# Patient Record
Sex: Female | Born: 1945 | Race: White | Hispanic: No | Smoking: Never smoker
Health system: Southern US, Community
[De-identification: ages and names within clinical notes are randomized; demographics above are authoritative.]

## PROBLEM LIST (undated history)

## (undated) DIAGNOSIS — D649 Anemia, unspecified: Secondary | ICD-10-CM

## (undated) DIAGNOSIS — M1990 Unspecified osteoarthritis, unspecified site: Secondary | ICD-10-CM

## (undated) DIAGNOSIS — D171 Benign lipomatous neoplasm of skin and subcutaneous tissue of trunk: Secondary | ICD-10-CM

## (undated) DIAGNOSIS — E119 Type 2 diabetes mellitus without complications: Secondary | ICD-10-CM

## (undated) DIAGNOSIS — M109 Gout, unspecified: Secondary | ICD-10-CM

## (undated) DIAGNOSIS — R634 Abnormal weight loss: Secondary | ICD-10-CM

## (undated) DIAGNOSIS — J189 Pneumonia, unspecified organism: Secondary | ICD-10-CM

## (undated) DIAGNOSIS — J069 Acute upper respiratory infection, unspecified: Secondary | ICD-10-CM

## (undated) DIAGNOSIS — G5603 Carpal tunnel syndrome, bilateral upper limbs: Secondary | ICD-10-CM

## (undated) DIAGNOSIS — I10 Essential (primary) hypertension: Secondary | ICD-10-CM

## (undated) DIAGNOSIS — R7303 Prediabetes: Secondary | ICD-10-CM

## (undated) DIAGNOSIS — D509 Iron deficiency anemia, unspecified: Secondary | ICD-10-CM

## (undated) DIAGNOSIS — E785 Hyperlipidemia, unspecified: Secondary | ICD-10-CM

## (undated) HISTORY — DX: Iron deficiency anemia, unspecified: D50.9

## (undated) HISTORY — DX: Hyperlipidemia, unspecified: E78.5

## (undated) HISTORY — DX: Type 2 diabetes mellitus without complications: E11.9

## (undated) HISTORY — PX: HAND SURGERY: SHX662

## (undated) HISTORY — DX: Benign lipomatous neoplasm of skin and subcutaneous tissue of trunk: D17.1

## (undated) HISTORY — PX: JOINT REPLACEMENT: SHX530

## (undated) HISTORY — DX: Unspecified osteoarthritis, unspecified site: M19.90

## (undated) HISTORY — PX: TOTAL KNEE ARTHROPLASTY: SHX125

## (undated) HISTORY — DX: Gout, unspecified: M10.9

## (undated) HISTORY — DX: Acute upper respiratory infection, unspecified: J06.9

## (undated) HISTORY — PX: DILATION AND CURETTAGE OF UTERUS: SHX78

## (undated) HISTORY — DX: Abnormal weight loss: R63.4

---

## 1998-06-25 ENCOUNTER — Emergency Department (HOSPITAL_COMMUNITY): Admission: EM | Admit: 1998-06-25 | Discharge: 1998-06-25 | Payer: Self-pay

## 2002-06-11 ENCOUNTER — Emergency Department (HOSPITAL_COMMUNITY): Admission: EM | Admit: 2002-06-11 | Discharge: 2002-06-11 | Payer: Self-pay | Admitting: Emergency Medicine

## 2002-06-11 ENCOUNTER — Encounter: Payer: Self-pay | Admitting: Emergency Medicine

## 2007-02-26 ENCOUNTER — Emergency Department (HOSPITAL_COMMUNITY): Admission: EM | Admit: 2007-02-26 | Discharge: 2007-02-26 | Payer: Self-pay | Admitting: Emergency Medicine

## 2007-03-09 ENCOUNTER — Emergency Department (HOSPITAL_COMMUNITY): Admission: EM | Admit: 2007-03-09 | Discharge: 2007-03-10 | Payer: Self-pay | Admitting: Emergency Medicine

## 2008-06-17 ENCOUNTER — Emergency Department (HOSPITAL_BASED_OUTPATIENT_CLINIC_OR_DEPARTMENT_OTHER): Admission: EM | Admit: 2008-06-17 | Discharge: 2008-06-17 | Payer: Self-pay | Admitting: Emergency Medicine

## 2008-06-17 ENCOUNTER — Ambulatory Visit: Payer: Self-pay | Admitting: Diagnostic Radiology

## 2009-10-23 ENCOUNTER — Emergency Department (HOSPITAL_BASED_OUTPATIENT_CLINIC_OR_DEPARTMENT_OTHER)
Admission: EM | Admit: 2009-10-23 | Discharge: 2009-10-23 | Payer: Self-pay | Source: Home / Self Care | Admitting: Emergency Medicine

## 2010-02-24 ENCOUNTER — Other Ambulatory Visit: Payer: Self-pay | Admitting: Orthopedic Surgery

## 2010-02-24 ENCOUNTER — Encounter (HOSPITAL_COMMUNITY): Payer: No Typology Code available for payment source

## 2010-02-24 ENCOUNTER — Other Ambulatory Visit (HOSPITAL_COMMUNITY): Payer: Self-pay | Admitting: Orthopedic Surgery

## 2010-02-24 ENCOUNTER — Ambulatory Visit (HOSPITAL_COMMUNITY)
Admission: RE | Admit: 2010-02-24 | Discharge: 2010-02-24 | Disposition: A | Discharge status: Home / Self Care | Payer: No Typology Code available for payment source | Source: Ambulatory Visit | Attending: Orthopedic Surgery | Admitting: Orthopedic Surgery

## 2010-02-24 DIAGNOSIS — Z01812 Encounter for preprocedural laboratory examination: Secondary | ICD-10-CM | POA: Insufficient documentation

## 2010-02-24 DIAGNOSIS — Z01818 Encounter for other preprocedural examination: Secondary | ICD-10-CM | POA: Insufficient documentation

## 2010-02-24 DIAGNOSIS — M1710 Unilateral primary osteoarthritis, unspecified knee: Secondary | ICD-10-CM

## 2010-02-24 LAB — URINALYSIS, ROUTINE W REFLEX MICROSCOPIC
Bilirubin Urine: NEGATIVE
Ketones, ur: NEGATIVE mg/dL
Nitrite: NEGATIVE
Protein, ur: NEGATIVE mg/dL

## 2010-02-24 LAB — BASIC METABOLIC PANEL
BUN: 7 mg/dL (ref 6–23)
Chloride: 107 mEq/L (ref 96–112)
Creatinine, Ser: 0.77 mg/dL (ref 0.4–1.2)
GFR calc Af Amer: >60 mL/min (ref >60)
GFR calc non Af Amer: >60 mL/min (ref >60)
Potassium: 4.4 mEq/L (ref 3.5–5.1)

## 2010-02-24 LAB — PROTIME-INR: INR: 1.12 (ref 0.00–1.49)

## 2010-02-24 LAB — CBC
MCH: 27.3 pg (ref 26.0–34.0)
MCHC: 32.1 g/dL (ref 30.0–36.0)
MCV: 85.2 fL (ref 78.0–100.0)
Platelets: 166 10*3/uL (ref 150–400)
RDW: 15.2 % (ref 11.5–15.5)
WBC: 6.4 10*3/uL (ref 4.0–10.5)

## 2010-02-24 LAB — APTT: aPTT: 30 seconds (ref 24–37)

## 2010-02-24 LAB — DIFFERENTIAL
Basophils Absolute: 0.1 10*3/uL (ref 0.0–0.1)
Basophils Relative: 1 % (ref 0–1)
Eosinophils Absolute: 0.1 10*3/uL (ref 0.0–0.7)
Eosinophils Relative: 2 % (ref 0–5)
Lymphocytes Relative: 25 % (ref 12–46)
Monocytes Absolute: 0.5 10*3/uL (ref 0.1–1.0)

## 2010-02-24 LAB — SURGICAL PCR SCREEN
MRSA, PCR: NEGATIVE
Staphylococcus aureus: NEGATIVE

## 2010-02-27 NOTE — H&P (Signed)
  NAME:  Susan Turner, AMPARO NO.:  [id number]  MEDICAL RECORD NO.:  [id number]           PATIENT TYPE:  LOCATION:                                 FACILITY:  PHYSICIAN:  Madlyn Frankel. Charlann Boxer, M.D.  DATE OF BIRTH:  01-16-1945  DATE OF ADMISSION: DATE OF DISCHARGE:                             HISTORY & PHYSICAL   ADMISSION DIAGNOSIS:  Right knee osteoarthritis.  BRIEF HISTORY:  This patient has been treated conservatively in our clinic for sometime for right knee pain.  She has failed conservative treatment and decided to proceed with arthroplasty.  She will be receiving tranexamic acid protocol prior to surgery.  PAST MEDICAL HISTORY:  Significant for vertigo history at least 2 times, but none recent.  She has a history of pneumonia, hypertension, hiatal hernia, varicose veins, gallbladder disease, osteoporosis, history of arthritis, gout, and measles.  She also has partial lower dentures, joint pain, back pain, morning stiffness in her knees.  CURRENT MEDICATIONS:  Azor 10/40 daily.  ALLERGIES:  She has an allergy to CODEINE and some types of NYLON.  SOCIAL HISTORY:  The patient is divorced.  She works for [employer] part time.  No history of alcohol, tobacco or drug abuse.  She has 1 child.  FAMILY HISTORY:  Her father died of lung cancer.  Mother died of stroke. She has 1 brother.  REVIEW OF SYSTEMS:  Notable for those difficulties described in the history of present illness.  Past medical history and review of systems sheet is otherwise unremarkable.  PHYSICAL EXAMINATION:  VITAL SIGNS:  The patient is 5 feet 6 inches and 225 pounds.  Blood pressure is 120/80, respirations are 18, pulse is 72. GENERAL:  General health is fair. HEENT:  Shows only the partial lower dentures.  She has normocephalic head. NECK:  Unremarkable. CHEST:  Clear to auscultation bilaterally. HEART:  Has S1 and S2.  There are no murmurs, rubs or gallops. ABDOMEN:  Pendulous.  GI/GU is  otherwise unremarkable. EXTREMITIES:  Osteoarthritis. DERMATOLOGIC:  She is intact. NEUROLOGIC:  She is intact.  Labs, EKG and chest x-ray are pending through [facility].  IMPRESSION:  Right knee osteoarthritis.  PLAN:  She will be admitted on March 03, 2010, for right total knee arthroplasty with Dr. Charlann Boxer.  DISCHARGE MEDICATIONS:  Xarelto, MiraLax, Colace, Robaxin, iron were given to her today.  Her pain medicines will be given to her at discharge.     Russell L. Webb Silversmith, RN   ______________________________ Madlyn Frankel Charlann Boxer, M.D.    RLW/MEDQ  D:  02/19/2010  T:  02/20/2010  Job:  [id number]  Electronically Signed by Lauree Chandler NP-C on 02/26/2010 01:22:45 PM Electronically Signed by Durene Romans M.D. on 02/27/2010 07:04:50 AM

## 2010-03-03 ENCOUNTER — Inpatient Hospital Stay (HOSPITAL_COMMUNITY)
Admission: RE | Admit: 2010-03-03 | Discharge: 2010-03-06 | DRG: 470 | Disposition: A | Discharge status: Home Health | Payer: No Typology Code available for payment source | Source: Ambulatory Visit | Attending: Orthopedic Surgery | Admitting: Orthopedic Surgery

## 2010-03-03 DIAGNOSIS — K219 Gastro-esophageal reflux disease without esophagitis: Secondary | ICD-10-CM | POA: Diagnosis present

## 2010-03-03 DIAGNOSIS — I10 Essential (primary) hypertension: Secondary | ICD-10-CM | POA: Diagnosis present

## 2010-03-03 DIAGNOSIS — E669 Obesity, unspecified: Secondary | ICD-10-CM | POA: Diagnosis present

## 2010-03-03 DIAGNOSIS — M1710 Unilateral primary osteoarthritis, unspecified knee: Principal | ICD-10-CM | POA: Diagnosis present

## 2010-03-03 DIAGNOSIS — M810 Age-related osteoporosis without current pathological fracture: Secondary | ICD-10-CM | POA: Diagnosis present

## 2010-03-03 LAB — TYPE AND SCREEN

## 2010-03-03 LAB — GLUCOSE, CAPILLARY

## 2010-03-03 LAB — ABO/RH: ABO/RH(D): AB POS

## 2010-03-04 LAB — CBC
HCT: 32.3 % — ABNORMAL LOW (ref 36.0–46.0)
MCH: 27.1 pg (ref 26.0–34.0)
MCV: 85 fL (ref 78.0–100.0)
RBC: 3.8 MIL/uL — ABNORMAL LOW (ref 3.87–5.11)
WBC: 7.3 10*3/uL (ref 4.0–10.5)

## 2010-03-04 LAB — BASIC METABOLIC PANEL
CO2: 28 mEq/L (ref 19–32)
Chloride: 106 mEq/L (ref 96–112)
Creatinine, Ser: 0.76 mg/dL (ref 0.4–1.2)
GFR calc Af Amer: >60 mL/min (ref >60)
Potassium: 4.6 mEq/L (ref 3.5–5.1)

## 2010-03-05 LAB — BASIC METABOLIC PANEL
BUN: 5 mg/dL — ABNORMAL LOW (ref 6–23)
CO2: 29 mEq/L (ref 19–32)
Calcium: 8.4 mg/dL (ref 8.4–10.5)
Creatinine, Ser: 0.68 mg/dL (ref 0.4–1.2)
GFR calc non Af Amer: >60 mL/min (ref >60)
Glucose, Bld: 142 mg/dL — ABNORMAL HIGH (ref 70–99)

## 2010-03-05 LAB — CBC
HCT: 31.7 % — ABNORMAL LOW (ref 36.0–46.0)
Hemoglobin: 10.1 g/dL — ABNORMAL LOW (ref 12.0–15.0)
MCH: 27.2 pg (ref 26.0–34.0)
MCHC: 31.9 g/dL (ref 30.0–36.0)
MCV: 85.2 fL (ref 78.0–100.0)
RDW: 15.2 % (ref 11.5–15.5)

## 2010-03-05 NOTE — Op Note (Signed)
NAME:  Susan Turner, MCCUTCHEON NO.:  [id number]  MEDICAL RECORD NO.:  [id number]           PATIENT TYPE:  I  LOCATION:  0008                         FACILITY:  [facility]  PHYSICIAN:  Madlyn Frankel. Charlann Boxer, M.D.  DATE OF BIRTH:  22-Dec-1945  DATE OF PROCEDURE:  03/03/2010 DATE OF DISCHARGE:                              OPERATIVE REPORT   PREOPERATIVE DIAGNOSIS:  Right knee osteoarthritis.  POSTOPERATIVE DIAGNOSIS:  Right knee osteoarthritis.  PROCEDURE:  Right total knee replacement.  COMPONENTS USED:  DePuy knee system size 4 narrow femur, 4 tibia, a size 12.5 rotating platform posterior stabilized knee insert, with a 38 patellar button.  SURGEON:  Madlyn Frankel. Charlann Boxer, M.D.  ASSISTANT:  Surgical team.  ANESTHESIA:  Spinal.  DRAINS:  One Hemovac.  TOURNIQUET TIME:  40 minutes at 250 mmHg.  INDICATIONS FOR THE PROCEDURE:  Ms. Wilford is a 65 year old female who had presented to the office for evaluation of bilateral knee pain, right greater than left.  Radiographs revealed advanced lateral compartment degenerative changes.  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  Consent was obtained for the benefit of pain relief after reviewing specific risks of infection, DVT, component failure, need for revision surgery.  PROCEDURE IN DETAIL:  The patient was brought to the operative theater. Once adequate anesthesia, preoperative antibiotics, Ancef 2 g administered, she was positioned supine with a right thigh tourniquet placed.  The right lower extremity was then prepped and draped in sterile fashion.  A time-out was performed identifying the patient, planned procedure, and extremity.  A midline incision was made followed by median parapatellar arthrotomy with minimal release medially.  She had more  of a valgus deformity.  After initial debridement exposure, attention was first directed to the patella.  Precut measurement was noted to be about 23 mm.  I resected down to 14 mm and used a 38 patellar button to restore height.  Lug holes were drilled and a metal shim placed to protect the patella from retractors and saw blades.  At this point, the attention was directed to the femur.  The femoral canal was opened with a drill, irrigated to try to prevent fat emboli. An intramedullary rod was passed and at 5 degrees of valgus, 11 mm of bone was resected off the distal femur due to a flexion contracture.  Following confirmation of this cut, the tibia was subluxated anteriorly and using the extramedullary guide, I resected about 8 mm of bone off the proximal tibia.  I confirmed the cut was perpendicular in the coronal plane, as well as that the extension gap was going to be stable with a 10-mm block.  Once this was done, I returned to the femur.  I sized the femur to be a size 4 in an anterior-posterior dimension and chose to use the narrow component for medial lateral dimensions.  The size 4 rotation block was pinned into position and the anterior referenced utilizing the C clamp on the  proximal tibia.  The 4-in-1 cutting block was then was positioned, pinned into place, confirmed there would be no notching.  The anterior, posterior, and chamfer cuts were then made without difficulty nor notching.  Final box cut was made on the base of the lateral aspect of the distal femur.  The tibia was then subluxated again, the cut surface sized and the best fit was size 4 tibial tray.  It was pinned into position, drilled and keel punched.  A trial reduction was now carried out with a 4 narrow femur, 4 tibia, and the 12.5 insert.  This was chosen due to a slight bit of hyperextension noted on the trial.  With this, the knee came to full extension.  The patella tracked through the trochlea  without application of pressure.  Given these findings, the trial components were removed.  The final components were opened and cement mixed.  The knee was injected with 0.25% Marcaine with epinephrine and 1 cc of Toradol for a total 51 cc in the synovial capsule junction.  The knee was then irrigated with normal saline pulse lavage.  The final components were then cemented on the clean and dried cut surfaces of the bone.  The knee was brought to extension with a 12.5 insert and extruded cement was removed.  Once the cement had cured and excessive cement was removed throughout the knee, the final 12.5 insert was placed and then the tourniquet had been let down at 40 minutes without significant hemostasis required.  The medium Hemovac drain was placed.  The extensor mechanism was then reapproximated using #1 Vicryl with the knee in flexion.  The remainder of the wound was closed with 2- 0 Vicryl and running 4-0 Monocryl.  The knee was cleaned, dried, and dressed sterilely with Dermabond and Aquacel dressing.  The drain site was dressed separately.  She was then brought to the recovery room in stable condition, tolerating the procedure well.Madlyn Frankel Charlann Boxer, M.D.     MDO/MEDQ  D:  03/03/2010  T:  03/03/2010  Job:  [id number]  Electronically Signed by Durene Romans M.D. on 03/05/2010 10:45:51 AM

## 2010-03-06 NOTE — Discharge Summary (Signed)
NAME:  [name], [name] NO.:  [id number]  MEDICAL RECORD NO.:  [id number]           PATIENT TYPE:  I  LOCATION:  1608                         FACILITY:  [facility]  PHYSICIAN:  Madlyn Frankel. Charlann Boxer, M.D.  DATE OF BIRTH:  Sep 29, 1945  DATE OF ADMISSION:  03/03/2010 DATE OF DISCHARGE:  03/06/2010                              DISCHARGE SUMMARY   ADMISSION DIAGNOSIS:  Right knee osteoarthritis.  DISCHARGE DIAGNOSIS:  Right knee osteoarthritis.  PROCEDURE:  Right total-knee replacement performed March 03, 2010.  PAST MEDICAL HISTORY:  History of vertigo, but none recently, as well as hypertension.  BRIEF HISTORY:  [name] is a very pleasant 65 year old female who presented to the office for right knee pain.  Radiographs revealed advanced degenerative changes, predominantly lateral and anterior.  She had failed conservative treatment including injections and medication and had a significant reduction in quality of life due to her pain.  She wished to proceed with knee arthroplasty.  The risks and benefits were discussed and reviewed.  Consent was obtained in the office setting.  HOSPITAL COURSE:  The patient was admitted through same-day surgery on March 03, 2010, for a right total-knee replacement.  Please see dictated operative note for full details of the procedure. Postoperatively she had a routine stay in the recovery room and then was transferred to the Orthopedic Floor where she remained for her hospital stay.  She was seen and evaluated by Physical Therapy on postoperative day #1 and progressed nicely with therapy.  There was some question of whether or not she would want to go to a nursing facility for social reasons.  However, based on her insurance she was unable to do this. The decision was to go to home.  On postoperative day #2 her laboratories were stable with a hematocrit of 31.7 and electrolytes that were normal.  She had been placed on Xarelto  for DVT prophylaxis and was placed on a regular diet, tolerating that well.  She had no hospital complications.  DISCHARGE INSTRUCTIONS:  She will be discharged to home on March 06, 2010 with home health physical therapy.  She has an Aquacel dressing in place for which she can keep in place and shower, as it is waterproof.  On March 12, 2010, she can remove this dressing and place dry gauze.  FOLLOWUP:  We will plan to see her back in the office at [facility] at office number [phone] in two weeks for a routine check and followup.  DISCHARGE MEDICATIONS: 1. Colace 100 mg p.o. b.i.d. as needed for constipation while on pain     medicines. 2. Iron 325 mg p.o. two to three times a day for 2 weeks as needed for     anemia. 3. Norco 7.5/325 one to two tablets every 4-6 hours as needed for pain     per Therapy. 4. Lasix 20 mg q.a.m. as needed for swelling. 5. Robaxin 500 mg q.6 hours p.r.n. for muscle spasm and pain. 6. MiraLax 17 grams p.o. daily as needed for constipation while on     pain medications. 7. Xarelto 10 mg p.o. daily for 10 days.  Following the use of Xarelto     she will take aspirin 325 mg for 4 weeks. 8. Allopurinol 30 mg q.a.m. 9. Azor which is amlodipine/olmesartan 10/40 mg one tablet q.a.m.  Any orthopedic questions can be addressed to [facility] at [phone].  Any medical issues she can contact her primary physician.     Madlyn Frankel Charlann Boxer, M.D.     MDO/MEDQ  D:  03/06/2010  T:  03/06/2010  Job:  [id number]  Electronically Signed by Durene Romans M.D. on 03/06/2010 04:46:52 PM

## 2010-03-18 LAB — URINALYSIS, ROUTINE W REFLEX MICROSCOPIC
Hgb urine dipstick: NEGATIVE
Ketones, ur: NEGATIVE mg/dL
Protein, ur: NEGATIVE mg/dL
Specific Gravity, Urine: 1.012 (ref 1.005–1.030)
Urobilinogen, UA: 0.2 mg/dL (ref 0.0–1.0)

## 2010-03-18 LAB — URINE MICROSCOPIC-ADD ON

## 2010-09-28 LAB — URINALYSIS, ROUTINE W REFLEX MICROSCOPIC
Glucose, UA: NEGATIVE
Nitrite: NEGATIVE
Protein, ur: 100 — AB
Specific Gravity, Urine: 1.024
Urobilinogen, UA: 4 — ABNORMAL HIGH
pH: 6

## 2010-09-28 LAB — DIFFERENTIAL
Basophils Absolute: 0
Basophils Relative: 0
Eosinophils Absolute: 0
Eosinophils Relative: 0
Lymphocytes Relative: 9 — ABNORMAL LOW
Lymphs Abs: 0.7
Monocytes Absolute: 0.9
Monocytes Relative: 11
Neutro Abs: 6.6
Neutrophils Relative %: 80 — ABNORMAL HIGH

## 2010-09-28 LAB — COMPREHENSIVE METABOLIC PANEL WITH GFR
ALT: 23
AST: 19
Albumin: 2.9 — ABNORMAL LOW
Alkaline Phosphatase: 76
BUN: 6
CO2: 28
Calcium: 8.7
Chloride: 99
Creatinine, Ser: 0.88
GFR calc non Af Amer: >60
Glucose, Bld: 122 — ABNORMAL HIGH
Potassium: 4.5
Sodium: 134 — ABNORMAL LOW
Total Bilirubin: 1.7 — ABNORMAL HIGH
Total Protein: 6.5

## 2010-09-28 LAB — CBC
MCHC: 34.3
MCV: 80.2
Platelets: 108 — ABNORMAL LOW
WBC: 8.2

## 2010-09-28 LAB — URINE MICROSCOPIC-ADD ON

## 2011-10-11 ENCOUNTER — Emergency Department (HOSPITAL_BASED_OUTPATIENT_CLINIC_OR_DEPARTMENT_OTHER)
Admission: EM | Admit: 2011-10-11 | Discharge: 2011-10-11 | Disposition: A | Discharge status: Home / Self Care | Payer: No Typology Code available for payment source | Attending: Emergency Medicine | Admitting: Emergency Medicine

## 2011-10-11 ENCOUNTER — Encounter (HOSPITAL_BASED_OUTPATIENT_CLINIC_OR_DEPARTMENT_OTHER): Payer: Self-pay | Admitting: *Deleted

## 2011-10-11 DIAGNOSIS — I10 Essential (primary) hypertension: Secondary | ICD-10-CM | POA: Insufficient documentation

## 2011-10-11 DIAGNOSIS — D649 Anemia, unspecified: Secondary | ICD-10-CM | POA: Insufficient documentation

## 2011-10-11 DIAGNOSIS — R55 Syncope and collapse: Secondary | ICD-10-CM | POA: Insufficient documentation

## 2011-10-11 HISTORY — DX: Essential (primary) hypertension: I10

## 2011-10-11 HISTORY — DX: Anemia, unspecified: D64.9

## 2011-10-11 LAB — COMPREHENSIVE METABOLIC PANEL
Albumin: 3.7 g/dL (ref 3.5–5.2)
BUN: 15 mg/dL (ref 6–23)
Chloride: 102 mEq/L (ref 96–112)
Creatinine, Ser: 0.7 mg/dL (ref 0.50–1.10)
Total Bilirubin: 0.4 mg/dL (ref 0.3–1.2)
Total Protein: 7.2 g/dL (ref 6.0–8.3)

## 2011-10-11 LAB — CBC
HCT: 38.7 % (ref 36.0–46.0)
MCH: 28.3 pg (ref 26.0–34.0)
MCHC: 34.4 g/dL (ref 30.0–36.0)
MCV: 82.3 fL (ref 78.0–100.0)
RDW: 14.9 % (ref 11.5–15.5)

## 2011-10-11 LAB — URINALYSIS, ROUTINE W REFLEX MICROSCOPIC
Ketones, ur: NEGATIVE mg/dL
Nitrite: NEGATIVE
Protein, ur: NEGATIVE mg/dL
Urobilinogen, UA: 1 mg/dL (ref 0.0–1.0)

## 2011-10-11 LAB — URINE MICROSCOPIC-ADD ON

## 2011-10-11 NOTE — ED Provider Notes (Signed)
History   This chart was scribed for Susan Chick, MD by Sofie Rower. The patient was seen in room MH09/MH09 and the patient's care was started at 4:53PM.    CSN: [id number]  Arrival date & time 10/11/11  1611   First MD Initiated Contact with Patient 10/11/11 1653      Chief Complaint  Patient presents with  . Near Syncope    (Consider location/radiation/quality/duration/timing/severity/associated sxs/prior treatment) Patient is a 66 y.o. female presenting with syncope. The history is provided by the patient. No language interpreter was used.  Loss of Consciousness This is a recurrent problem. The current episode started more than 1 week ago. The problem occurs every several days. The problem has not changed since onset.Pertinent negatives include no chest pain, no abdominal pain, no headaches and no shortness of breath. Nothing aggravates the symptoms. Nothing relieves the symptoms. She has tried nothing for the symptoms. The treatment provided no relief.    Susan Turner is a 66 y.o. female , with a hx of loss of consciuosness, who presents to the Emergency Department complaining of sudden, moderate, near loss of counsciousness, onset today, with associated symptoms of fatigue, nausea, and sweats. The pt reports she was at work on 09/24/11, where she experienced an episode where everything went black and she nearly fainted. In addition, the pt informs that she was sitting in front of the computer today, where she suddenly experienced sweats and a few seconds where she felt she might lose consciousness for a few seconds. The pt reports that the syncopal episodes last nearly 5 seconds in duration. The pt has a hx of hypertension and anemia. She also states that she has a hx of fainting multiple times and that this is baseline for her- she also states that she had a workup on her heart for fainting that was negative.  She is concerned that the computer may be causing her symptoms today.   She was seen by her PMD earlier this week and states her labs were normal.  No palpitations.    The pt denies headache, chest pain, vomiting, diarrhea, and taking any new medications at present.   The pt does not smoke or drink alcohol.   PCP is Dr. Nicholos Johns.    Past Medical History  Diagnosis Date  . Hypertension   . Anemia     Past Surgical History  Procedure Date  . Joint replacement     History reviewed. No pertinent family history.  History  Substance Use Topics  . Smoking status: Never Smoker   . Smokeless tobacco: Not on file  . Alcohol Use: No    OB History    Grav Para Term Preterm Abortions TAB SAB Ect Mult Living                  Review of Systems  Respiratory: Negative for shortness of breath.   Cardiovascular: Positive for syncope. Negative for chest pain.  Gastrointestinal: Negative for abdominal pain.  Neurological: Negative for headaches.  All other systems reviewed and are negative.    Allergies  Lisinopril; Codeine; and Motrin  Home Medications   Current Outpatient Rx  Name Route Sig Dispense Refill  . AMLODIPINE BESYLATE 10 MG PO TABS Oral Take 10 mg by mouth daily.    Marland Kitchen FERROUS SULFATE CR 160 (50 FE) MG PO TBCR Oral Take 160 mg by mouth daily.    Marland Kitchen LOSARTAN POTASSIUM-HCTZ 100-25 MG PO TABS Oral Take 1 tablet by mouth daily.  BP 141/83  Pulse 76  Temp 98.6 F (37 C) (Oral)  Resp 16  Ht 5\' 6"  (1.676 m)  Wt 243 lb (110.224 kg)  BMI 39.22 kg/m2  SpO2 98%  Physical Exam  Nursing note and vitals reviewed. Constitutional: She is oriented to person, place, and time. She appears well-developed and well-nourished.  HENT:  Head: Atraumatic.  Right Ear: External ear normal.  Left Ear: External ear normal.  Nose: Nose normal.  Eyes: Conjunctivae normal and EOM are normal.  Neck: Normal range of motion. Neck supple.  Cardiovascular: Normal rate, regular rhythm and normal heart sounds.   Pulmonary/Chest: Effort normal and breath  sounds normal.  Abdominal: Soft.  Musculoskeletal: Normal range of motion.  Neurological: She is alert and oriented to person, place, and time. No cranial nerve deficit.  Skin: Skin is warm and dry.  Psychiatric: She has a normal mood and affect. Her behavior is normal.    ED Course  Procedures (including critical care time)  DIAGNOSTIC STUDIES: Oxygen Saturation is 98% on room air, normal by my interpretation.     Date: 10/11/2011  Rate: 71  Rhythm: normal sinus rhythm  QRS Axis: left  Intervals: normal  ST/T Wave abnormalities: nonspecific ST/T changes  Conduction Disutrbances:nonspecific intraventricular conduction delay  Narrative Interpretation:   Old EKG Reviewed: none available    COORDINATION OF CARE:    6:11PM- Evaluation and follow up with a Cardiologist, EKG results, blood work, and UA discussed with patient. Pt agrees with treatment.   Results for orders placed during the hospital encounter of 10/11/11  CBC      Component Value Range   WBC 8.8  4.0 - 10.5 K/uL   RBC 4.70  3.87 - 5.11 MIL/uL   Hemoglobin 13.3  12.0 - 15.0 g/dL   HCT 16.1  09.6 - 04.5 %   MCV 82.3  78.0 - 100.0 fL   MCH 28.3  26.0 - 34.0 pg   MCHC 34.4  30.0 - 36.0 g/dL   RDW 40.9  81.1 - 91.4 %   Platelets 167  150 - 400 K/uL  COMPREHENSIVE METABOLIC PANEL      Component Value Range   Sodium 142  135 - 145 mEq/L   Potassium 3.6  3.5 - 5.1 mEq/L   Chloride 102  96 - 112 mEq/L   CO2 30  19 - 32 mEq/L   Glucose, Bld 129 (*) 70 - 99 mg/dL   BUN 15  6 - 23 mg/dL   Creatinine, Ser 7.82  0.50 - 1.10 mg/dL   Calcium 9.6  8.4 - 95.6 mg/dL   Total Protein 7.2  6.0 - 8.3 g/dL   Albumin 3.7  3.5 - 5.2 g/dL   AST 21  0 - 37 U/L   ALT 21  0 - 35 U/L   Alkaline Phosphatase 82  39 - 117 U/L   Total Bilirubin 0.4  0.3 - 1.2 mg/dL   GFR calc non Af Amer 88 (*) >90 mL/min   GFR calc Af Amer >90  >90 mL/min  URINALYSIS, ROUTINE W REFLEX MICROSCOPIC      Component Value Range   Color, Urine  YELLOW  YELLOW   APPearance CLOUDY (*) CLEAR   Specific Gravity, Urine 1.020  1.005 - 1.030   pH 6.0  5.0 - 8.0   Glucose, UA NEGATIVE  NEGATIVE mg/dL   Hgb urine dipstick NEGATIVE  NEGATIVE   Bilirubin Urine NEGATIVE  NEGATIVE   Ketones, ur NEGATIVE  NEGATIVE mg/dL   Protein, ur NEGATIVE  NEGATIVE mg/dL   Urobilinogen, UA 1.0  0.0 - 1.0 mg/dL   Nitrite NEGATIVE  NEGATIVE   Leukocytes, UA SMALL (*) NEGATIVE  URINE MICROSCOPIC-ADD ON      Component Value Range   Squamous Epithelial / LPF RARE  RARE   WBC, UA 3-6  <3 WBC/hpf   RBC / HPF 0-2  <3 RBC/hpf   Bacteria, UA FEW (*) RARE   Urine-Other MUCOUS PRESENT       No results found.   1. Near syncope       MDM  Pt presents with c/o near syncope.  Symptoms sound vasovagal in nature- brief with diaphoresis and nausea.  She feels at her baseline several seconds after the events.  Workup in the ED is reassuring.  I have given her information for cardiology followup.  Discharged with strict return precautions.  Pt agreeable with plan.    I personally performed the services described in this documentation, which was scribed in my presence. The recorded information has been reviewed and considered.    Susan Chick, MD 10/11/11 1925

## 2011-10-11 NOTE — ED Notes (Signed)
Pt c/o several episodes of near scynope x 1 month, pt c/o fatigue, LAbs normal at PMD x 1 week ago

## 2011-10-11 NOTE — ED Notes (Signed)
Pt reports standing on feet for long hours at work and experienced an episode at work. Reports swelling to lower legs and feet, non pitting edema noted.

## 2011-10-13 LAB — URINE CULTURE

## 2012-04-14 ENCOUNTER — Other Ambulatory Visit: Payer: Self-pay

## 2012-04-14 DIAGNOSIS — Z1231 Encounter for screening mammogram for malignant neoplasm of breast: Secondary | ICD-10-CM

## 2012-05-22 ENCOUNTER — Ambulatory Visit
Admission: RE | Admit: 2012-05-22 | Discharge: 2012-05-22 | Disposition: A | Discharge status: Home / Self Care | Payer: No Typology Code available for payment source | Source: Ambulatory Visit

## 2012-05-22 DIAGNOSIS — Z1231 Encounter for screening mammogram for malignant neoplasm of breast: Secondary | ICD-10-CM

## 2012-12-07 ENCOUNTER — Emergency Department (HOSPITAL_COMMUNITY)
Admission: EM | Admit: 2012-12-07 | Discharge: 2012-12-08 | Disposition: A | Discharge status: Home / Self Care | Payer: No Typology Code available for payment source | Attending: Emergency Medicine | Admitting: Emergency Medicine

## 2012-12-07 ENCOUNTER — Emergency Department (HOSPITAL_COMMUNITY): Payer: No Typology Code available for payment source

## 2012-12-07 ENCOUNTER — Encounter (HOSPITAL_COMMUNITY): Payer: Self-pay | Admitting: Emergency Medicine

## 2012-12-07 DIAGNOSIS — Y92410 Unspecified street and highway as the place of occurrence of the external cause: Secondary | ICD-10-CM | POA: Insufficient documentation

## 2012-12-07 DIAGNOSIS — Z79899 Other long term (current) drug therapy: Secondary | ICD-10-CM | POA: Insufficient documentation

## 2012-12-07 DIAGNOSIS — T07XXXA Unspecified multiple injuries, initial encounter: Secondary | ICD-10-CM | POA: Diagnosis not present

## 2012-12-07 DIAGNOSIS — D649 Anemia, unspecified: Secondary | ICD-10-CM | POA: Diagnosis not present

## 2012-12-07 DIAGNOSIS — I10 Essential (primary) hypertension: Secondary | ICD-10-CM | POA: Insufficient documentation

## 2012-12-07 DIAGNOSIS — R1011 Right upper quadrant pain: Secondary | ICD-10-CM | POA: Insufficient documentation

## 2012-12-07 DIAGNOSIS — Y9389 Activity, other specified: Secondary | ICD-10-CM | POA: Insufficient documentation

## 2012-12-07 DIAGNOSIS — L089 Local infection of the skin and subcutaneous tissue, unspecified: Secondary | ICD-10-CM | POA: Diagnosis present

## 2012-12-07 MED ORDER — FENTANYL CITRATE 0.05 MG/ML IJ SOLN
50.0000 ug | Freq: Once | INTRAMUSCULAR | Status: AC
  Administered 2012-12-08: 50 ug via INTRAVENOUS
  Filled 2012-12-07: qty 2

## 2012-12-07 MED ORDER — SODIUM CHLORIDE 0.9 % IV SOLN
INTRAVENOUS | Status: DC
  Administered 2012-12-08: via INTRAVENOUS

## 2012-12-07 MED ORDER — IOHEXOL 300 MG/ML  SOLN
100.0000 mL | Freq: Once | INTRAMUSCULAR | Status: AC | PRN
  Administered 2012-12-07: 100 mL via INTRAVENOUS
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
## 2012-12-08 ENCOUNTER — Emergency Department (HOSPITAL_COMMUNITY): Payer: No Typology Code available for payment source

## 2012-12-08 DIAGNOSIS — T07XXXA Unspecified multiple injuries, initial encounter: Secondary | ICD-10-CM | POA: Diagnosis not present

## 2012-12-08 LAB — POCT I-STAT, CHEM 8
BUN: 23 mg/dL (ref 6–23)
Calcium, Ion: 1.17 mmol/L (ref 1.13–1.30)
Chloride: 106 meq/L (ref 96–112)
Creatinine, Ser: 0.8 mg/dL (ref 0.50–1.10)
Glucose, Bld: 134 mg/dL — ABNORMAL HIGH (ref 70–99)
HCT: 43 % (ref 36.0–46.0)
Hemoglobin: 14.6 g/dL (ref 12.0–15.0)
Potassium: 3.9 meq/L (ref 3.5–5.1)
Sodium: 143 meq/L (ref 135–145)
TCO2: 26 mmol/L (ref 0–100)

## 2012-12-08 LAB — CBC WITH DIFFERENTIAL/PLATELET
Basophils Absolute: 0 10*3/uL (ref 0.0–0.1)
Basophils Relative: 0 % (ref 0–1)
HCT: 40.6 % (ref 36.0–46.0)
MCHC: 34.5 g/dL (ref 30.0–36.0)
Monocytes Absolute: 0.5 10*3/uL (ref 0.1–1.0)
Neutro Abs: 5.8 10*3/uL (ref 1.7–7.7)
RDW: 14.1 % (ref 11.5–15.5)

## 2012-12-08 MED ORDER — FENTANYL CITRATE 0.05 MG/ML IJ SOLN
100.0000 ug | Freq: Once | INTRAMUSCULAR | Status: AC
  Administered 2012-12-08: 100 ug via INTRAVENOUS
  Filled 2012-12-08: qty 2

## 2012-12-08 MED ORDER — HYDROCODONE-ACETAMINOPHEN 5-325 MG PO TABS
1.0000 | ORAL_TABLET | Freq: Once | ORAL | Status: AC
  Administered 2012-12-08: 1 via ORAL
  Filled 2012-12-08 (×2): qty 1

## 2012-12-08 MED ORDER — HYDROCODONE-ACETAMINOPHEN 5-325 MG PO TABS: 1.0000 | ORAL_TABLET | Freq: Four times a day (QID) | ORAL | Status: DC | PRN
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
## 2013-07-07 ENCOUNTER — Encounter (HOSPITAL_COMMUNITY): Payer: Self-pay | Admitting: Emergency Medicine

## 2013-07-07 ENCOUNTER — Emergency Department (HOSPITAL_COMMUNITY)
Admission: EM | Admit: 2013-07-07 | Discharge: 2013-07-07 | Disposition: A | Discharge status: Home / Self Care | Payer: Worker's Compensation | Attending: Emergency Medicine | Admitting: Emergency Medicine

## 2013-07-07 ENCOUNTER — Emergency Department (HOSPITAL_COMMUNITY): Payer: Worker's Compensation

## 2013-07-07 DIAGNOSIS — IMO0002 Reserved for concepts with insufficient information to code with codable children: Secondary | ICD-10-CM | POA: Insufficient documentation

## 2013-07-07 DIAGNOSIS — Y9389 Activity, other specified: Secondary | ICD-10-CM | POA: Insufficient documentation

## 2013-07-07 DIAGNOSIS — I10 Essential (primary) hypertension: Secondary | ICD-10-CM | POA: Insufficient documentation

## 2013-07-07 DIAGNOSIS — S60212A Contusion of left wrist, initial encounter: Secondary | ICD-10-CM

## 2013-07-07 DIAGNOSIS — D649 Anemia, unspecified: Secondary | ICD-10-CM | POA: Insufficient documentation

## 2013-07-07 DIAGNOSIS — Z79899 Other long term (current) drug therapy: Secondary | ICD-10-CM | POA: Insufficient documentation

## 2013-07-07 DIAGNOSIS — S60219A Contusion of unspecified wrist, initial encounter: Secondary | ICD-10-CM | POA: Insufficient documentation

## 2013-07-07 DIAGNOSIS — Y9229 Other specified public building as the place of occurrence of the external cause: Secondary | ICD-10-CM | POA: Diagnosis not present

## 2013-07-07 DIAGNOSIS — S59919A Unspecified injury of unspecified forearm, initial encounter: Secondary | ICD-10-CM | POA: Diagnosis present

## 2013-07-07 DIAGNOSIS — S59909A Unspecified injury of unspecified elbow, initial encounter: Secondary | ICD-10-CM | POA: Diagnosis present

## 2013-07-07 MED ORDER — HYDROCODONE-ACETAMINOPHEN 5-325 MG PO TABS: 1.0000 | ORAL_TABLET | Freq: Four times a day (QID) | ORAL | Status: DC | PRN
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
## 2013-07-07 NOTE — Discharge Instructions (Signed)
Your xrays were negative today, which means you did not break any bones in your wrist or hand. Keep the splint on for comfort, but take it off to do range of motion exercises 4 times daily. Do not use the splint for longer than 7 days. Follow up with the orthopedic doctor listed, and your primary care doctor in one week for follow up. If swelling gets worse, and your fingers go numb, return to the emergency department immediately. Use ice for 20 minute intervals, and elevate your arm.   Contusion A contusion is a deep bruise. Contusions happen when an injury causes bleeding under the skin. Signs of bruising include pain, puffiness (swelling), and discolored skin. The contusion may turn blue, purple, or yellow. HOME CARE   Put ice on the injured area.  Put ice in a plastic bag.  Place a towel between your skin and the bag.  Leave the ice on for 15-20 minutes, 03-04 times a day.  Only take medicine as told by your doctor.  Rest the injured area.  If possible, raise (elevate) the injured area to lessen puffiness. GET HELP RIGHT AWAY IF:   You have more bruising or puffiness.  You have pain that is getting worse.  Your puffiness or pain is not helped by medicine. MAKE SURE YOU:   Understand these instructions.  Will watch your condition.  Will get help right away if you are not doing well or get worse. Document Released: 06/09/2007 Document Revised: 03/15/2011 Document Reviewed: 10/26/2010 [facility] Patient Information 2015 [place], [place]. This information is not intended to replace advice given to you by your health care provider. Make sure you discuss any questions you have with your health care provider.  Hematoma A hematoma is a collection of blood. The collection of blood can turn into a hard, painful lump under the skin. Your skin may turn blue or yellow if the hematoma is close to the surface of the skin. Most hematomas get better in a few days to weeks. Some hematomas are  serious and need medical care. Hematomas can be very small or very big. HOME CARE  Apply ice to the injured area:  Put ice in a plastic bag.  Place a towel between your skin and the bag.  Leave the ice on for 20 minutes, 2-3 times a day for the first 1 to 2 days.  After the first 2 days, switch to using warm packs on the injured area.  Raise (elevate) the injured area to lessen pain and puffiness (swelling). You may also wrap the area with an elastic bandage. Make sure the bandage is not wrapped too tight.  If you have a painful hematoma on your leg or foot, you may use crutches for a couple days.  Only take medicines as told by your doctor. GET HELP RIGHT AWAY IF:   Your pain gets worse.  Your pain is not controlled with medicine.  You have a fever.  Your puffiness gets worse.  Your skin turns more blue or yellow.  Your skin over the hematoma breaks or starts bleeding.  Your hematoma is in your chest or belly (abdomen) and you are short of breath, feel weak, or have a change in consciousness.  Your hematoma is on your scalp and you have a headache that gets worse or a change in alertness or consciousness. MAKE SURE YOU:   Understand these instructions.  Will watch your condition.  Will get help right away if you are not doing well  or get worse. Document Released: 01/29/2004 Document Revised: 08/23/2012 Document Reviewed: 05/31/2012 [facility] Patient Information 2015 [place], [place]. This information is not intended to replace advice given to you by your health care provider. Make sure you discuss any questions you have with your health care provider.  Cryotherapy Cryotherapy means treatment with cold. Ice or gel packs can be used to reduce both pain and swelling. Ice is the most helpful within the first 24 to 48 hours after an injury or flareup from overusing a muscle or joint. Sprains, strains, spasms, burning pain, shooting pain, and aches can all be eased with ice.  Ice can also be used when recovering from surgery. Ice is effective, has very few side effects, and is safe for most people to use. PRECAUTIONS  Ice is not a safe treatment option for people with:  Raynaud's phenomenon. This is a condition affecting small blood vessels in the extremities. Exposure to cold may cause your problems to return.  Cold hypersensitivity. There are many forms of cold hypersensitivity, including:  Cold urticaria. Red, itchy hives appear on the skin when the tissues begin to warm after being iced.  Cold erythema. This is a red, itchy rash caused by exposure to cold.  Cold hemoglobinuria. Red blood cells break down when the tissues begin to warm after being iced. The hemoglobin that carry oxygen are passed into the urine because they cannot combine with blood proteins fast enough.  Numbness or altered sensitivity in the area being iced. If you have any of the following conditions, do not use ice until you have discussed cryotherapy with your caregiver:  Heart conditions, such as arrhythmia, angina, or chronic heart disease.  High blood pressure.  Healing wounds or open skin in the area being iced.  Current infections.  Rheumatoid arthritis.  Poor circulation.  Diabetes. Ice slows the blood flow in the region it is applied. This is beneficial when trying to stop inflamed tissues from spreading irritating chemicals to surrounding tissues. However, if you expose your skin to cold temperatures for too long or without the proper protection, you can damage your skin or nerves. Watch for signs of skin damage due to cold. HOME CARE INSTRUCTIONS Follow these tips to use ice and cold packs safely.  Place a dry or damp towel between the ice and skin. A damp towel will cool the skin more quickly, so you may need to shorten the time that the ice is used.  For a more rapid response, add gentle compression to the ice.  Ice for no more than 10 to 20 minutes at a time. The  bonier the area you are icing, the less time it will take to get the benefits of ice.  Check your skin after 5 minutes to make sure there are no signs of a poor response to cold or skin damage.  Rest 20 minutes or more in between uses.  Once your skin is numb, you can end your treatment. You can test numbness by very lightly touching your skin. The touch should be so light that you do not see the skin dimple from the pressure of your fingertip. When using ice, most people will feel these normal sensations in this order: cold, burning, aching, and numbness.  Do not use ice on someone who cannot communicate their responses to pain, such as small children or people with dementia. HOW TO MAKE AN ICE PACK Ice packs are the most common way to use ice therapy. Other methods include ice massage,  ice baths, and cryo-sprays. Muscle creams that cause a cold, tingly feeling do not offer the same benefits that ice offers and should not be used as a substitute unless recommended by your caregiver. To make an ice pack, do one of the following:  Place crushed ice or a bag of frozen vegetables in a sealable plastic bag. Squeeze out the excess air. Place this bag inside another plastic bag. Slide the bag into a pillowcase or place a damp towel between your skin and the bag.  Mix 3 parts water with 1 part rubbing alcohol. Freeze the mixture in a sealable plastic bag. When you remove the mixture from the freezer, it will be slushy. Squeeze out the excess air. Place this bag inside another plastic bag. Slide the bag into a pillowcase or place a damp towel between your skin and the bag. SEEK MEDICAL CARE IF:  You develop white spots on your skin. This may give the skin a blotchy (mottled) appearance.  Your skin turns blue or pale.  Your skin becomes waxy or hard.  Your swelling gets worse. MAKE SURE YOU:   Understand these instructions.  Will watch your condition.  Will get help right away if you are not  doing well or get worse. Document Released: 08/17/2010 Document Revised: 03/15/2011 Document Reviewed: 08/17/2010 [facility] Patient Information 2015 [place], [place]. This information is not intended to replace advice given to you by your health care provider. Make sure you discuss any questions you have with your health care provider.
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
## 2013-07-08 NOTE — ED Provider Notes (Signed)
Medical screening examination/treatment/procedure(s) were performed by non-physician practitioner and as supervising physician I was immediately available for consultation/collaboration.   EKG Interpretation None         Neta Ehlers, MD 07/08/13 1217

## 2014-03-25 DIAGNOSIS — Z23 Encounter for immunization: Secondary | ICD-10-CM | POA: Diagnosis not present

## 2014-03-25 DIAGNOSIS — M25562 Pain in left knee: Secondary | ICD-10-CM | POA: Diagnosis not present

## 2014-03-25 DIAGNOSIS — I10 Essential (primary) hypertension: Secondary | ICD-10-CM | POA: Diagnosis not present

## 2014-03-25 DIAGNOSIS — E119 Type 2 diabetes mellitus without complications: Secondary | ICD-10-CM | POA: Diagnosis not present

## 2014-06-28 DIAGNOSIS — E669 Obesity, unspecified: Secondary | ICD-10-CM | POA: Diagnosis not present

## 2014-06-28 DIAGNOSIS — Z6838 Body mass index (BMI) 38.0-38.9, adult: Secondary | ICD-10-CM | POA: Diagnosis not present

## 2014-06-28 DIAGNOSIS — N898 Other specified noninflammatory disorders of vagina: Secondary | ICD-10-CM | POA: Diagnosis not present

## 2014-06-28 DIAGNOSIS — N950 Postmenopausal bleeding: Secondary | ICD-10-CM | POA: Diagnosis not present

## 2014-06-28 DIAGNOSIS — Z7901 Long term (current) use of anticoagulants: Secondary | ICD-10-CM | POA: Diagnosis not present

## 2014-07-17 DIAGNOSIS — N8501 Benign endometrial hyperplasia: Secondary | ICD-10-CM | POA: Diagnosis not present

## 2014-07-17 DIAGNOSIS — N898 Other specified noninflammatory disorders of vagina: Secondary | ICD-10-CM | POA: Diagnosis not present

## 2014-07-17 DIAGNOSIS — N950 Postmenopausal bleeding: Secondary | ICD-10-CM | POA: Diagnosis not present

## 2014-07-17 DIAGNOSIS — E669 Obesity, unspecified: Secondary | ICD-10-CM | POA: Diagnosis not present

## 2014-07-17 DIAGNOSIS — N842 Polyp of vagina: Secondary | ICD-10-CM | POA: Diagnosis not present

## 2014-07-17 DIAGNOSIS — Z6838 Body mass index (BMI) 38.0-38.9, adult: Secondary | ICD-10-CM | POA: Diagnosis not present

## 2014-08-12 DIAGNOSIS — I10 Essential (primary) hypertension: Secondary | ICD-10-CM | POA: Diagnosis not present

## 2014-08-12 DIAGNOSIS — Z0289 Encounter for other administrative examinations: Secondary | ICD-10-CM | POA: Diagnosis not present

## 2014-08-12 DIAGNOSIS — Z111 Encounter for screening for respiratory tuberculosis: Secondary | ICD-10-CM | POA: Diagnosis not present

## 2014-09-11 DIAGNOSIS — N840 Polyp of corpus uteri: Secondary | ICD-10-CM | POA: Diagnosis not present

## 2014-09-11 DIAGNOSIS — N8501 Benign endometrial hyperplasia: Secondary | ICD-10-CM | POA: Diagnosis not present

## 2014-10-28 DIAGNOSIS — I10 Essential (primary) hypertension: Secondary | ICD-10-CM | POA: Diagnosis not present

## 2014-10-28 DIAGNOSIS — M25562 Pain in left knee: Secondary | ICD-10-CM | POA: Diagnosis not present

## 2014-10-28 DIAGNOSIS — E119 Type 2 diabetes mellitus without complications: Secondary | ICD-10-CM | POA: Diagnosis not present

## 2014-10-28 DIAGNOSIS — Z1389 Encounter for screening for other disorder: Secondary | ICD-10-CM | POA: Diagnosis not present

## 2014-10-28 DIAGNOSIS — Z0000 Encounter for general adult medical examination without abnormal findings: Secondary | ICD-10-CM | POA: Diagnosis not present

## 2014-12-10 DIAGNOSIS — Z780 Asymptomatic menopausal state: Secondary | ICD-10-CM | POA: Diagnosis not present

## 2015-04-15 DIAGNOSIS — M25562 Pain in left knee: Secondary | ICD-10-CM | POA: Diagnosis not present

## 2015-04-15 DIAGNOSIS — Z1239 Encounter for other screening for malignant neoplasm of breast: Secondary | ICD-10-CM | POA: Diagnosis not present

## 2015-04-15 DIAGNOSIS — E119 Type 2 diabetes mellitus without complications: Secondary | ICD-10-CM | POA: Diagnosis not present

## 2015-04-15 DIAGNOSIS — I10 Essential (primary) hypertension: Secondary | ICD-10-CM | POA: Diagnosis not present

## 2015-04-17 ENCOUNTER — Other Ambulatory Visit: Payer: Self-pay | Admitting: Family Medicine

## 2015-04-17 DIAGNOSIS — Z1231 Encounter for screening mammogram for malignant neoplasm of breast: Secondary | ICD-10-CM

## 2015-05-05 ENCOUNTER — Ambulatory Visit
Admission: RE | Admit: 2015-05-05 | Discharge: 2015-05-05 | Disposition: A | Discharge status: Home / Self Care | Payer: Commercial Managed Care - HMO | Source: Ambulatory Visit | Attending: Family Medicine | Admitting: Family Medicine

## 2015-05-05 DIAGNOSIS — Z1231 Encounter for screening mammogram for malignant neoplasm of breast: Secondary | ICD-10-CM

## 2015-05-19 DIAGNOSIS — I10 Essential (primary) hypertension: Secondary | ICD-10-CM | POA: Diagnosis not present

## 2015-05-24 ENCOUNTER — Encounter (HOSPITAL_COMMUNITY): Payer: Self-pay | Admitting: Emergency Medicine

## 2015-05-24 ENCOUNTER — Emergency Department (HOSPITAL_COMMUNITY)
Admission: EM | Admit: 2015-05-24 | Discharge: 2015-05-24 | Disposition: A | Discharge status: Home / Self Care | Payer: Commercial Managed Care - HMO | Attending: Emergency Medicine | Admitting: Emergency Medicine

## 2015-05-24 ENCOUNTER — Emergency Department (HOSPITAL_COMMUNITY): Payer: Commercial Managed Care - HMO

## 2015-05-24 DIAGNOSIS — Z96651 Presence of right artificial knee joint: Secondary | ICD-10-CM | POA: Diagnosis not present

## 2015-05-24 DIAGNOSIS — E119 Type 2 diabetes mellitus without complications: Secondary | ICD-10-CM | POA: Diagnosis not present

## 2015-05-24 DIAGNOSIS — M25551 Pain in right hip: Secondary | ICD-10-CM | POA: Diagnosis not present

## 2015-05-24 DIAGNOSIS — Z79899 Other long term (current) drug therapy: Secondary | ICD-10-CM | POA: Insufficient documentation

## 2015-05-24 DIAGNOSIS — M1611 Unilateral primary osteoarthritis, right hip: Secondary | ICD-10-CM | POA: Diagnosis not present

## 2015-05-24 DIAGNOSIS — I10 Essential (primary) hypertension: Secondary | ICD-10-CM | POA: Diagnosis not present

## 2015-05-24 MED ORDER — HYDROCODONE-ACETAMINOPHEN 5-325 MG PO TABS: 1.0000 | ORAL_TABLET | ORAL | Status: DC | PRN

## 2015-05-24 MED ORDER — PREDNISONE 20 MG PO TABS: ORAL_TABLET | ORAL | Status: DC

## 2015-05-24 NOTE — ED Notes (Signed)
Pt transported from home with c/o R hip pain onset 3 Weeks ago, denies trauma. Per EMS pt states pain worse tonight, took Tramadol 0100, then vomited.

## 2015-05-24 NOTE — ED Notes (Signed)
Pt states pain has increased tonight without trauma, moving extremity, denies radiation, pain remains in R hip

## 2015-05-24 NOTE — ED Notes (Signed)
Bed: WA24 Expected date:  Expected time:  Means of arrival:  Comments: EMS 

## 2015-05-24 NOTE — ED Provider Notes (Signed)
CSN: [id number]     Arrival date & time 05/24/15  0230 History  By signing my name below, I, Susan Turner, attest that this documentation has been prepared under the direction and in the presence of Orpah Greek, MD. Electronically Signed: Rayna Turner, ED Scribe. 05/24/2015. 2:59 AM.   Chief Complaint  Patient presents with  . Hip Pain   The history is provided by the patient. No language interpreter was used.    HPI Comments: Susan Turner is a 70 y.o. female who presents to the Emergency Department complaining of constant, moderate, atraumatic, right hip pain x 2-3 weeks. Pt reports her PCP recently increased her rx metoprolol and is unsure if her hip pain is related. Since her medication change she addtionally reports associated, intermittent, dizziness which is alleviated with dramamine. She denies her hip pain was evaluated by her PCP. She took tramadol for pain management without significant relief. Her pain worsens with movement. She denies any recent falls or trauma. Pt denies any other associated symptoms at this time.   Past Medical History  Diagnosis Date  . Hypertension   . Anemia   . Hyperlipidemia   . Gout   . Iron deficiency anemia   . Diabetes mellitus without complication ([place])   . URI (upper respiratory infection)   . Weight loss   . Lipoma of back    Past Surgical History  Procedure Laterality Date  . Joint replacement    . Total knee arthroplasty Right    No family history on file. Social History  Substance Use Topics  . Smoking status: Never Smoker   . Smokeless tobacco: Never Used  . Alcohol Use: No   OB History    No data available     Review of Systems  Musculoskeletal: Positive for arthralgias.  All other systems reviewed and are negative.  Allergies  Lisinopril; Codeine; and Motrin  Home Medications   Prior to Admission medications   Medication Sig Start Date End Date Taking? Authorizing Provider  amLODipine (NORVASC) 10  MG tablet Take 10 mg by mouth daily.    Historical Provider, MD  ferrous sulfate dried (SLOW FE) 160 (50 FE) MG TBCR Take 160 mg by mouth daily.    Historical Provider, MD  HYDROcodone-acetaminophen (NORCO) 5-325 MG per tablet Take 1-2 tablets by mouth every 6 (six) hours as needed (for pain). 12/08/12   John Molpus, MD  HYDROcodone-acetaminophen (NORCO) 5-325 MG per tablet Take 1-2 tablets by mouth every 6 (six) hours as needed. 07/07/13   Mercedes Camprubi-Soms, PA-C  losartan-hydrochlorothiazide (HYZAAR) 100-25 MG per tablet Take 1 tablet by mouth daily.    Historical Provider, MD  metoprolol (LOPRESSOR) 50 MG tablet Take 50 mg by mouth 2 (two) times daily.    Historical Provider, MD   BP 144/67 mmHg  Pulse 57  Temp(Src) 98.3 F (36.8 C) (Oral)  Resp 19  SpO2 100%    Physical Exam  Constitutional: She is oriented to person, place, and time. She appears well-developed and well-nourished. No distress.  HENT:  Head: Normocephalic and atraumatic.  Right Ear: Hearing normal.  Left Ear: Hearing normal.  Nose: Nose normal.  Mouth/Throat: Oropharynx is clear and moist and mucous membranes are normal.  Eyes: Conjunctivae and EOM are normal. Pupils are equal, round, and reactive to light.  Neck: Normal range of motion. Neck supple.  Cardiovascular: Regular rhythm, S1 normal and S2 normal.  Exam reveals no gallop and no friction rub.   No murmur heard.  Pulmonary/Chest: Effort normal and breath sounds normal. No respiratory distress. She exhibits no tenderness.  Abdominal: Soft. Normal appearance and bowel sounds are normal. There is no hepatosplenomegaly. There is no tenderness. There is no rebound, no guarding, no tenderness at McBurney's point and negative Murphy's sign. No hernia.  Musculoskeletal: Normal range of motion.  Neurological: She is alert and oriented to person, place, and time. She has normal strength. No cranial nerve deficit or sensory deficit. Coordination normal. GCS eye  subscore is 4. GCS verbal subscore is 5. GCS motor subscore is 6.  Skin: Skin is warm, dry and intact. No rash noted. No cyanosis.  Psychiatric: She has a normal mood and affect. Her speech is normal and behavior is normal. Thought content normal.  Nursing note and vitals reviewed.   ED Course  Procedures  DIAGNOSTIC STUDIES: Oxygen Saturation is 100% on RA, normal by my interpretation.    COORDINATION OF CARE: 2:56 AM Discussed next steps with pt. She verbalized understanding and is agreeable with the plan.   Labs Review Labs Reviewed - No data to display  Imaging Review No results found. I have personally reviewed and evaluated these images as part of my medical decision-making.   EKG Interpretation None      MDM   Final diagnoses:  None  Osteoarthritis, right hip  Patient presents to the emergency part for evaluation of atraumatic right hip pain. Symptoms have been ongoing for 2 or 3 weeks. Patient describes sharp pain throughout the entire area of the joint. X-ray shows osteoarthritic changes, no fracture or dislocation. Patient has been taking tramadol without improvement. She will be given increased analgesia and prednisone, follow-up with Dr. Alvan Dame her orthopedist.  I personally performed the services described in this documentation, which was scribed in my presence. The recorded information has been reviewed and is accurate.     Orpah Greek, MD 05/24/15 [phone]

## 2015-05-24 NOTE — ED Notes (Signed)
Pt and family given extended education and instructions regarding eating and taking narcotics as well as follow up with ortho. Pt given work note

## 2015-05-24 NOTE — Discharge Instructions (Signed)

## 2015-05-26 DIAGNOSIS — M25551 Pain in right hip: Secondary | ICD-10-CM | POA: Diagnosis not present

## 2015-05-26 DIAGNOSIS — M25651 Stiffness of right hip, not elsewhere classified: Secondary | ICD-10-CM | POA: Diagnosis not present

## 2015-05-26 DIAGNOSIS — M1611 Unilateral primary osteoarthritis, right hip: Secondary | ICD-10-CM | POA: Diagnosis not present

## 2015-05-31 ENCOUNTER — Ambulatory Visit (INDEPENDENT_AMBULATORY_CARE_PROVIDER_SITE_OTHER): Payer: Commercial Managed Care - HMO

## 2015-05-31 ENCOUNTER — Ambulatory Visit (INDEPENDENT_AMBULATORY_CARE_PROVIDER_SITE_OTHER): Payer: Commercial Managed Care - HMO | Admitting: Emergency Medicine

## 2015-05-31 VITALS — BP 120/80 | HR 72 | Temp 97.9°F | Resp 18 | Ht 66.0 in | Wt 214.2 lb

## 2015-05-31 DIAGNOSIS — I10 Essential (primary) hypertension: Secondary | ICD-10-CM

## 2015-05-31 DIAGNOSIS — M1990 Unspecified osteoarthritis, unspecified site: Secondary | ICD-10-CM | POA: Diagnosis not present

## 2015-05-31 DIAGNOSIS — L819 Disorder of pigmentation, unspecified: Secondary | ICD-10-CM | POA: Diagnosis not present

## 2015-05-31 DIAGNOSIS — R350 Frequency of micturition: Secondary | ICD-10-CM

## 2015-05-31 DIAGNOSIS — R109 Unspecified abdominal pain: Secondary | ICD-10-CM | POA: Diagnosis not present

## 2015-05-31 DIAGNOSIS — K8020 Calculus of gallbladder without cholecystitis without obstruction: Secondary | ICD-10-CM

## 2015-05-31 DIAGNOSIS — R739 Hyperglycemia, unspecified: Secondary | ICD-10-CM | POA: Diagnosis not present

## 2015-05-31 DIAGNOSIS — M25551 Pain in right hip: Secondary | ICD-10-CM

## 2015-05-31 LAB — POC MICROSCOPIC URINALYSIS (UMFC)

## 2015-05-31 LAB — POCT URINALYSIS DIP (MANUAL ENTRY)
BILIRUBIN UA: NEGATIVE
Bilirubin, UA: NEGATIVE
Glucose, UA: NEGATIVE
Leukocytes, UA: NEGATIVE
Nitrite, UA: NEGATIVE
PH UA: 6
PROTEIN UA: NEGATIVE
RBC UA: NEGATIVE
Urobilinogen, UA: 0.2

## 2015-05-31 LAB — POCT CBC
GRANULOCYTE PERCENT: 77.1 % (ref 37–80)
HCT, POC: 44.4 % (ref 37.7–47.9)
Hemoglobin: 15.8 g/dL (ref 12.2–16.2)
Lymph, poc: 1.6 (ref 0.6–3.4)
MCH, POC: 29.6 pg (ref 27–31.2)
MCHC: 35.5 g/dL — AB (ref 31.8–35.4)
MCV: 83.4 fL (ref 80–97)
MID (cbc): 0.6 (ref 0–0.9)
MPV: 8.9 fL (ref 0–99.8)
PLATELET COUNT, POC: 168 10*3/uL (ref 142–424)
POC Granulocyte: 4.6 (ref 2–6.9)
POC LYMPH %: 16.5 % (ref 10–50)
POC MID %: 6.4 %M (ref 0–12)
RBC: 5.32 M/uL (ref 4.04–5.48)
RDW, POC: 14.4 %
WBC: 9.8 10*3/uL (ref 4.6–10.2)

## 2015-05-31 LAB — COMPLETE METABOLIC PANEL WITH GFR
ALBUMIN: 4.4 g/dL (ref 3.6–5.1)
ALK PHOS: 79 U/L (ref 33–130)
ALT: 30 U/L — ABNORMAL HIGH (ref 6–29)
AST: 16 U/L (ref 10–35)
BILIRUBIN TOTAL: 0.7 mg/dL (ref 0.2–1.2)
BUN: 13 mg/dL (ref 7–25)
CALCIUM: 9.5 mg/dL (ref 8.6–10.4)
CO2: 29 mmol/L (ref 20–31)
CREATININE: 0.78 mg/dL (ref 0.50–0.99)
Chloride: 102 mmol/L (ref 98–110)
GFR, Est African American: >89 mL/min (ref >=60)
GFR, Est Non African American: 78 mL/min (ref >=60)
Glucose, Bld: 145 mg/dL — ABNORMAL HIGH (ref 65–99)
POTASSIUM: 4 mmol/L (ref 3.5–5.3)
Sodium: 140 mmol/L (ref 135–146)
TOTAL PROTEIN: 6.9 g/dL (ref 6.1–8.1)

## 2015-05-31 LAB — LIPASE: LIPASE: 67 U/L — AB (ref 7–60)

## 2015-05-31 LAB — GLUCOSE, POCT (MANUAL RESULT ENTRY): POC Glucose: 163 mg/dl — AB (ref 70–99)

## 2015-05-31 MED ORDER — GABAPENTIN 100 MG PO CAPS: ORAL_CAPSULE | ORAL | Status: DC

## 2015-05-31 MED ORDER — TRAMADOL HCL 50 MG PO TABS: ORAL_TABLET | ORAL | Status: DC

## 2015-05-31 NOTE — Progress Notes (Addendum)
By signing my name below, I, Judithe Modest, attest that this documentation has been prepared under the direction and in the presence of Nena Jordan, MD. Electronically Signed: Judithe Modest, ER Scribe. 05/31/2015. 3:11 PM.  Chief Complaint:  Chief Complaint  Patient presents with  . Urinary Tract Infection    frequent urination x1 week  . Flank Pain    right side   HPI: Susan Turner is a 70 y.o. female who reports to [facility] today complaining of right flank pain for the last week. One week ago she woke up feeling dizzy, and continued to feel dizzy for 24 hours. She went to the ER on the following evening on 05/23/2015, and after right hip x-ray she was told her pain was likely due to right hip arthritis. She her appetite has been significantly reduced for the last week, and she notes she has lost nine lbs over that period. Dr. Dierdre Forth is her PCP. She denies fever.   Past Medical History  Diagnosis Date  . Hypertension   . Anemia   . Hyperlipidemia   . Gout   . Iron deficiency anemia   . Diabetes mellitus without complication ([place])   . URI (upper respiratory infection)   . Weight loss   . Lipoma of back   . Arthritis    Past Surgical History  Procedure Laterality Date  . Joint replacement    . Total knee arthroplasty Right    Social History   Social History  . Marital Status: Divorced    Spouse Name: N/A  . Number of Children: N/A  . Years of Education: N/A   Social History Main Topics  . Smoking status: Never Smoker   . Smokeless tobacco: Never Used  . Alcohol Use: No  . Drug Use: No  . Sexual Activity: No   Other Topics Concern  . None   Social History Narrative   Family History  Problem Relation Age of Onset  . Hypertension Mother   . Stroke Mother   . Cancer Father    Allergies  Allergen Reactions  . Lisinopril Cough  . Codeine   . Motrin [Ibuprofen]    Prior to Admission medications   Medication Sig Start Date End Date Taking?  Authorizing Provider  amLODipine (NORVASC) 10 MG tablet Take 10 mg by mouth daily.   Yes Historical Provider, MD  ferrous sulfate dried (SLOW FE) 160 (50 FE) MG TBCR Take 160 mg by mouth daily.   Yes Historical Provider, MD  losartan-hydrochlorothiazide (HYZAAR) 100-25 MG per tablet Take 1 tablet by mouth daily.   Yes Historical Provider, MD  metoprolol (LOPRESSOR) 50 MG tablet Take 50 mg by mouth 2 (two) times daily.   Yes Historical Provider, MD  HYDROcodone-acetaminophen (NORCO/VICODIN) 5-325 MG tablet Take 1 tablet by mouth every 4 (four) hours as needed for moderate pain. Patient not taking: Reported on 05/31/2015 05/24/15   Orpah Greek, MD  predniSONE (DELTASONE) 20 MG tablet 3 tabs po daily x 3 days, then 2 tabs x 3 days, then 1.5 tabs x 3 days, then 1 tab x 3 days, then 0.5 tabs x 3 days Patient not taking: Reported on 05/31/2015 05/24/15   Orpah Greek, MD     ROS: The patient denies fevers, chills, night sweats, chest pain, palpitations, wheezing, dyspnea on exertion, nausea, vomiting, abdominal pain, dysuria, hematuria, melena, numbness, weakness, or tingling.  All other systems have been reviewed and were otherwise negative with the exception of  those mentioned in the HPI and as above.    PHYSICAL EXAM: Filed Vitals:   05/31/15 1342  BP: 120/80  Pulse: 72  Temp: 97.9 F (36.6 C)  Resp: 18   Body mass index is 34.59 kg/(m^2).   General: Alert, no acute distress HEENT:  Normocephalic, atraumatic, oropharynx patent. Eye: Juliette Mangle [facility] Cardiovascular:  Regular rate and rhythm, no rubs murmurs or gallops.  No Carotid bruits, radial pulse intact. No pedal edema.  Respiratory: Clear to auscultation bilaterally.  No wheezes, rales, or rhonchi.  No cyanosis, no use of accessory musculature Abdominal: No organomegaly, abdomen is soft and non-tender, positive bowel sounds.  No masses. Musculoskeletal: Gait intact. No edema, tenderness. Large lipoma over the upper  thoracic area. Exquisitely tender over iliac crest on right. Good ROM in the right hip. Skin: No rashes. There is a 1/1cm raised pigmented mole, right upper abdomen. No surrounding redness. Neurologic: Facial musculature symmetric. Psychiatric: Patient acts appropriately throughout our interaction. Lymphatic: No cervical or submandibular lymphadenopathy   LABS: Results for orders placed or performed in visit on 05/31/15  POCT Microscopic Urinalysis ([facility])  Result Value Ref Range   WBC,UR,HPF,POC Few (A) None WBC/hpf   RBC,UR,HPF,POC None None RBC/hpf   Bacteria Few (A) None, Too numerous to count   Mucus Present (A) Absent   Epithelial Cells, UR Per Microscopy Few (A) None, Too numerous to count cells/hpf  POCT urinalysis dipstick  Result Value Ref Range   Color, UA light yellow (A) yellow   Clarity, UA hazy (A) clear   Glucose, UA negative negative   Bilirubin, UA negative negative   Ketones, POC UA negative negative   Spec Grav, UA <=1.005    Blood, UA negative negative   pH, UA 6.0    Protein Ur, POC negative negative   Urobilinogen, UA 0.2    Nitrite, UA Negative Negative   Leukocytes, UA Negative Negative  POCT glucose (manual entry)  Result Value Ref Range   POC Glucose 163 (A) 70 - 99 mg/dl  POCT CBC  Result Value Ref Range   WBC 9.8 4.6 - 10.2 K/uL   Lymph, poc 1.6 0.6 - 3.4   POC LYMPH PERCENT 16.5 10 - 50 %L   MID (cbc) 0.6 0 - 0.9   POC MID % 6.4 0 - 12 %M   POC Granulocyte 4.6 2 - 6.9   Granulocyte percent 77.1 37 - 80 %G   RBC 5.32 4.04 - 5.48 M/uL   Hemoglobin 15.8 12.2 - 16.2 g/dL   HCT, POC 44.4 37.7 - 47.9 %   MCV 83.4 80 - 97 fL   MCH, POC 29.6 27 - 31.2 pg   MCHC 35.5 (A) 31.8 - 35.4 g/dL   RDW, POC 14.4 %   Platelet Count, POC 168 142 - 424 K/uL   MPV 8.9 0 - 99.8 fL    EKG/XRAY:   Primary read interpreted by Dr. Everlene Farrier at [facility].  Dg Abd Acute W/chest  05/31/2015  CLINICAL DATA:  Right flank pain for 9 days EXAM: DG ABDOMEN ACUTE W/ 1V CHEST  COMPARISON:  12/07/2012 FINDINGS: Cardiac shadow is within normal limits. The lungs are clear. No acute bony abnormality is seen. Degenerative changes of the lumbar spine are noted. Calcification is again noted in the left upper quadrant consistent with previous calcification in the spleen. The known gallstones are not as well appreciated on this exam. No free air is noted. No focal mass lesion is seen. IMPRESSION: No acute abnormality  is noted. Chronic calcifications are noted consistent with prior splenic calcification and cholelithiasis. Electronically Signed   By: Inez Catalina M.D.   On: 05/31/2015 15:30   Mm Digital Screening Bilateral  05/05/2015  CLINICAL DATA:  Screening. EXAM: DIGITAL SCREENING BILATERAL MAMMOGRAM WITH CAD COMPARISON:  Previous exam(s). ACR Breast Density Category b: There are scattered areas of fibroglandular density. FINDINGS: There are no findings suspicious for malignancy. Images were processed with CAD. IMPRESSION: No mammographic evidence of malignancy. A result letter of this screening mammogram will be mailed directly to the patient. RECOMMENDATION: Screening mammogram in one year. (Code:SM-B-01Y) BI-RADS CATEGORY  1: Negative. Electronically Signed   By: Altamese Cabal M.D.   On: 05/05/2015 16:45   Dg Hip Unilat With Pelvis 2-3 Views Right  05/24/2015  CLINICAL DATA:  70 year old female with right hip pain. EXAM: DG HIP (WITH OR WITHOUT PELVIS) 2-3V RIGHT COMPARISON:  None. FINDINGS: There is no acute fracture or dislocation. There is osteoarthritic changes of the hips with joint space narrowing more prominent on the right. There is degenerative changes of the visualized lower lumbar spine. IMPRESSION: No acute fracture or dislocation. Moderate osteoarthritic changes of the right hip. Electronically Signed   By: Anner Crete M.D.   On: 05/24/2015 03:16     ASSESSMENT/PLAN: Patient appears to be in a significant amount of pain. She is known to have gallstones. She  has had nausea and heartburn symptoms with this. This could be an atypical presentation for gallbladder disease. I did a comprehensive metabolic panel to see if her liver tests are elevated. We'll also add a lipase. Will treat with pain medication for now and discuss with her how she wants to follow-up.I personally performed the services described in this documentation, which was scribed in my presence. The recorded information has been reviewed and is accurate. Her pain seems to be musculoskeletal to me. She is scheduled for a hip injection on Thursday. I told her she should not have this done until she sees Dr. Alvan Dame and lets him examine her. Certainly could be pain radiating from her back. 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 Before any abdominal scanning is done we need to wait on her comprehensive metabolic panel and lipase.Burnis Medin add low-dose Neurontin to see if that helps she can take this along with her Ultram.I personally performed the services described in this documentation, which was scribed in my presence. The recorded information has been reviewed and is accurate. Of note her random sugar was elevated at 163   Gross sideeffects, risk and benefits, and alternatives of medications d/w patient. Patient is aware that all medications have potential sideeffects and we are unable to predict every sideeffect or drug-drug interaction that may occur.  Arlyss Queen MD 05/31/2015 3:11 PM

## 2015-05-31 NOTE — Patient Instructions (Addendum)
Please call Dr. Aurea Graff office on Tuesday so you can get an appointment to see him prior to your hip injection. I will call you with results of your blood work.    IF you received an x-ray today, you will receive an invoice from [facility]. Please contact [facility] at [phone] with questions or concerns regarding your invoice.   IF you received labwork today, you will receive an invoice from [employer]. Please contact [facility] at [phone] with questions or concerns regarding your invoice.   Our billing staff will not be able to assist you with questions regarding bills from these companies.  You will be contacted with the lab results as soon as they are available. The fastest way to get your results is to activate your My Chart account. Instructions are located on the last page of this paperwork. If you have not heard from [place] regarding the results in 2 weeks, please contact this office.

## 2015-06-01 ENCOUNTER — Other Ambulatory Visit: Payer: Self-pay | Admitting: Emergency Medicine

## 2015-06-01 DIAGNOSIS — R109 Unspecified abdominal pain: Secondary | ICD-10-CM

## 2015-06-02 LAB — URINE CULTURE: Colony Count: >=100000

## 2015-06-04 ENCOUNTER — Ambulatory Visit
Admission: RE | Admit: 2015-06-04 | Discharge: 2015-06-04 | Disposition: A | Discharge status: Home / Self Care | Payer: Commercial Managed Care - HMO | Source: Ambulatory Visit | Attending: Emergency Medicine | Admitting: Emergency Medicine

## 2015-06-04 DIAGNOSIS — N200 Calculus of kidney: Secondary | ICD-10-CM | POA: Diagnosis not present

## 2015-06-04 DIAGNOSIS — R109 Unspecified abdominal pain: Secondary | ICD-10-CM

## 2015-06-04 MED ORDER — IOPAMIDOL (ISOVUE-300) INJECTION 61%: 100.0000 mL | Freq: Once | INTRAVENOUS | Status: DC | PRN

## 2015-06-05 DIAGNOSIS — E119 Type 2 diabetes mellitus without complications: Secondary | ICD-10-CM | POA: Diagnosis not present

## 2015-06-05 DIAGNOSIS — H524 Presbyopia: Secondary | ICD-10-CM | POA: Diagnosis not present

## 2015-06-12 DIAGNOSIS — M1611 Unilateral primary osteoarthritis, right hip: Secondary | ICD-10-CM | POA: Diagnosis not present

## 2015-06-13 ENCOUNTER — Telehealth: Payer: Self-pay | Admitting: Physician Assistant

## 2015-06-13 NOTE — Telephone Encounter (Signed)
May 18th-June 5th DX: Hip pain, kidney stones/injection from Dr. Nelva Bush in her hip. 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

## 2015-06-13 NOTE — Telephone Encounter (Signed)
Left message for pt to call back  °

## 2015-06-13 NOTE — Telephone Encounter (Signed)
The patient brought is a letter to Dr. Everlene Farrier and Attending Physician's Statement to be completed. It needs to be done by 12 noon tomorrow.  After review of the most recent visit here, I am not clear what her reason for being out of work was.  Please get the details from her so that I can help get the form completed in time.  Dates out of work? For what diagnosis? Job duties/ADLs unable to perform? etc

## 2015-06-13 NOTE — Telephone Encounter (Signed)
Form completed and returned to Nurses' box

## 2015-06-17 DIAGNOSIS — M25551 Pain in right hip: Secondary | ICD-10-CM | POA: Diagnosis not present

## 2015-06-17 DIAGNOSIS — D485 Neoplasm of uncertain behavior of skin: Secondary | ICD-10-CM | POA: Diagnosis not present

## 2015-07-04 DIAGNOSIS — M1611 Unilateral primary osteoarthritis, right hip: Secondary | ICD-10-CM | POA: Diagnosis not present

## 2015-07-16 DIAGNOSIS — D239 Other benign neoplasm of skin, unspecified: Secondary | ICD-10-CM | POA: Diagnosis not present

## 2015-07-16 DIAGNOSIS — L821 Other seborrheic keratosis: Secondary | ICD-10-CM | POA: Diagnosis not present

## 2015-07-16 DIAGNOSIS — D485 Neoplasm of uncertain behavior of skin: Secondary | ICD-10-CM | POA: Diagnosis not present

## 2015-07-16 DIAGNOSIS — D179 Benign lipomatous neoplasm, unspecified: Secondary | ICD-10-CM | POA: Diagnosis not present

## 2015-07-28 ENCOUNTER — Other Ambulatory Visit: Payer: Self-pay | Admitting: Emergency Medicine

## 2015-08-15 DIAGNOSIS — M1611 Unilateral primary osteoarthritis, right hip: Secondary | ICD-10-CM | POA: Diagnosis not present

## 2015-08-15 DIAGNOSIS — M25551 Pain in right hip: Secondary | ICD-10-CM | POA: Diagnosis not present

## 2016-03-02 DIAGNOSIS — M25562 Pain in left knee: Secondary | ICD-10-CM | POA: Diagnosis not present

## 2016-03-02 DIAGNOSIS — I10 Essential (primary) hypertension: Secondary | ICD-10-CM | POA: Diagnosis not present

## 2016-03-02 DIAGNOSIS — Z1159 Encounter for screening for other viral diseases: Secondary | ICD-10-CM | POA: Diagnosis not present

## 2016-03-02 DIAGNOSIS — Z1389 Encounter for screening for other disorder: Secondary | ICD-10-CM | POA: Diagnosis not present

## 2016-03-02 DIAGNOSIS — N390 Urinary tract infection, site not specified: Secondary | ICD-10-CM | POA: Diagnosis not present

## 2016-03-02 DIAGNOSIS — M25551 Pain in right hip: Secondary | ICD-10-CM | POA: Diagnosis not present

## 2016-03-02 DIAGNOSIS — Z6837 Body mass index (BMI) 37.0-37.9, adult: Secondary | ICD-10-CM | POA: Diagnosis not present

## 2016-03-02 DIAGNOSIS — Z0000 Encounter for general adult medical examination without abnormal findings: Secondary | ICD-10-CM | POA: Diagnosis not present

## 2016-03-02 DIAGNOSIS — E119 Type 2 diabetes mellitus without complications: Secondary | ICD-10-CM | POA: Diagnosis not present

## 2016-03-19 DIAGNOSIS — Z1211 Encounter for screening for malignant neoplasm of colon: Secondary | ICD-10-CM | POA: Diagnosis not present

## 2016-03-25 ENCOUNTER — Other Ambulatory Visit: Payer: Self-pay | Admitting: Family Medicine

## 2016-03-25 DIAGNOSIS — Z1231 Encounter for screening mammogram for malignant neoplasm of breast: Secondary | ICD-10-CM

## 2016-05-06 ENCOUNTER — Ambulatory Visit
Admission: RE | Admit: 2016-05-06 | Discharge: 2016-05-06 | Disposition: A | Discharge status: Home / Self Care | Payer: Commercial Managed Care - HMO | Source: Ambulatory Visit | Attending: Family Medicine | Admitting: Family Medicine

## 2016-05-06 DIAGNOSIS — Z1231 Encounter for screening mammogram for malignant neoplasm of breast: Secondary | ICD-10-CM

## 2016-05-17 DIAGNOSIS — I10 Essential (primary) hypertension: Secondary | ICD-10-CM | POA: Diagnosis not present

## 2016-05-17 DIAGNOSIS — E119 Type 2 diabetes mellitus without complications: Secondary | ICD-10-CM | POA: Diagnosis not present

## 2016-09-13 DIAGNOSIS — E119 Type 2 diabetes mellitus without complications: Secondary | ICD-10-CM | POA: Diagnosis not present

## 2016-09-13 DIAGNOSIS — I10 Essential (primary) hypertension: Secondary | ICD-10-CM | POA: Diagnosis not present

## 2016-09-13 DIAGNOSIS — Z6837 Body mass index (BMI) 37.0-37.9, adult: Secondary | ICD-10-CM | POA: Diagnosis not present

## 2016-09-13 DIAGNOSIS — Z1211 Encounter for screening for malignant neoplasm of colon: Secondary | ICD-10-CM | POA: Diagnosis not present

## 2016-09-13 DIAGNOSIS — M25562 Pain in left knee: Secondary | ICD-10-CM | POA: Diagnosis not present

## 2016-09-13 DIAGNOSIS — M25551 Pain in right hip: Secondary | ICD-10-CM | POA: Diagnosis not present

## 2017-03-09 ENCOUNTER — Other Ambulatory Visit: Payer: Self-pay | Admitting: Family Medicine

## 2017-03-09 DIAGNOSIS — I10 Essential (primary) hypertension: Secondary | ICD-10-CM | POA: Diagnosis not present

## 2017-03-09 DIAGNOSIS — Z8249 Family history of ischemic heart disease and other diseases of the circulatory system: Secondary | ICD-10-CM

## 2017-03-09 DIAGNOSIS — Z0000 Encounter for general adult medical examination without abnormal findings: Secondary | ICD-10-CM | POA: Diagnosis not present

## 2017-03-09 DIAGNOSIS — M25562 Pain in left knee: Secondary | ICD-10-CM | POA: Diagnosis not present

## 2017-03-09 DIAGNOSIS — E119 Type 2 diabetes mellitus without complications: Secondary | ICD-10-CM | POA: Diagnosis not present

## 2017-03-09 DIAGNOSIS — E7800 Pure hypercholesterolemia, unspecified: Secondary | ICD-10-CM | POA: Diagnosis not present

## 2017-03-09 DIAGNOSIS — M25551 Pain in right hip: Secondary | ICD-10-CM | POA: Diagnosis not present

## 2017-03-09 DIAGNOSIS — E2839 Other primary ovarian failure: Secondary | ICD-10-CM | POA: Diagnosis not present

## 2017-03-09 DIAGNOSIS — Z1389 Encounter for screening for other disorder: Secondary | ICD-10-CM | POA: Diagnosis not present

## 2017-04-04 ENCOUNTER — Ambulatory Visit
Admission: RE | Admit: 2017-04-04 | Discharge: 2017-04-04 | Disposition: A | Discharge status: Home / Self Care | Payer: Medicare HMO | Source: Ambulatory Visit | Attending: Family Medicine | Admitting: Family Medicine

## 2017-04-04 DIAGNOSIS — Z8249 Family history of ischemic heart disease and other diseases of the circulatory system: Secondary | ICD-10-CM

## 2017-04-04 DIAGNOSIS — Z136 Encounter for screening for cardiovascular disorders: Secondary | ICD-10-CM | POA: Diagnosis not present

## 2017-04-04 DIAGNOSIS — I714 Abdominal aortic aneurysm, without rupture: Secondary | ICD-10-CM | POA: Diagnosis not present

## 2017-04-27 ENCOUNTER — Other Ambulatory Visit: Payer: Self-pay | Admitting: Family Medicine

## 2017-04-27 DIAGNOSIS — Z1231 Encounter for screening mammogram for malignant neoplasm of breast: Secondary | ICD-10-CM

## 2017-05-16 DIAGNOSIS — E2839 Other primary ovarian failure: Secondary | ICD-10-CM | POA: Diagnosis not present

## 2017-05-19 ENCOUNTER — Ambulatory Visit
Admission: RE | Admit: 2017-05-19 | Discharge: 2017-05-19 | Disposition: A | Discharge status: Home / Self Care | Payer: Medicare HMO | Source: Ambulatory Visit | Attending: Family Medicine | Admitting: Family Medicine

## 2017-05-19 DIAGNOSIS — Z1231 Encounter for screening mammogram for malignant neoplasm of breast: Secondary | ICD-10-CM | POA: Diagnosis not present

## 2017-09-09 DIAGNOSIS — Z6836 Body mass index (BMI) 36.0-36.9, adult: Secondary | ICD-10-CM | POA: Diagnosis not present

## 2017-09-09 DIAGNOSIS — M25562 Pain in left knee: Secondary | ICD-10-CM | POA: Diagnosis not present

## 2017-09-09 DIAGNOSIS — I10 Essential (primary) hypertension: Secondary | ICD-10-CM | POA: Diagnosis not present

## 2017-09-09 DIAGNOSIS — M25551 Pain in right hip: Secondary | ICD-10-CM | POA: Diagnosis not present

## 2017-09-09 DIAGNOSIS — E119 Type 2 diabetes mellitus without complications: Secondary | ICD-10-CM | POA: Diagnosis not present

## 2017-09-09 DIAGNOSIS — E7800 Pure hypercholesterolemia, unspecified: Secondary | ICD-10-CM | POA: Diagnosis not present

## 2017-12-15 ENCOUNTER — Other Ambulatory Visit: Payer: Self-pay | Admitting: Family Medicine

## 2017-12-15 ENCOUNTER — Ambulatory Visit
Admission: RE | Admit: 2017-12-15 | Discharge: 2017-12-15 | Disposition: A | Discharge status: Home / Self Care | Payer: Medicare HMO | Source: Ambulatory Visit | Attending: Family Medicine | Admitting: Family Medicine

## 2017-12-15 DIAGNOSIS — M25551 Pain in right hip: Secondary | ICD-10-CM

## 2017-12-15 DIAGNOSIS — M1611 Unilateral primary osteoarthritis, right hip: Secondary | ICD-10-CM | POA: Diagnosis not present

## 2017-12-26 DIAGNOSIS — M25551 Pain in right hip: Secondary | ICD-10-CM | POA: Diagnosis not present

## 2017-12-26 DIAGNOSIS — D1721 Benign lipomatous neoplasm of skin and subcutaneous tissue of right arm: Secondary | ICD-10-CM | POA: Diagnosis not present

## 2017-12-26 DIAGNOSIS — M1611 Unilateral primary osteoarthritis, right hip: Secondary | ICD-10-CM | POA: Diagnosis not present

## 2017-12-26 DIAGNOSIS — M7061 Trochanteric bursitis, right hip: Secondary | ICD-10-CM | POA: Diagnosis not present

## 2018-01-31 DIAGNOSIS — D1721 Benign lipomatous neoplasm of skin and subcutaneous tissue of right arm: Secondary | ICD-10-CM | POA: Diagnosis not present

## 2018-01-31 DIAGNOSIS — G5601 Carpal tunnel syndrome, right upper limb: Secondary | ICD-10-CM | POA: Diagnosis not present

## 2018-02-03 IMAGING — CT CT ABD-PELV W/ CM
1 of 3 series · 13 of 32 positions shown, 18 images · IV contrast (APPLIED)
Comparison: 12/07/2012

CLINICAL DATA: Right flank pain for 3 weeks.  Elevated lipase.

EXAM:
CT ABDOMEN AND PELVIS WITH CONTRAST
TECHNIQUE: Multidetector CT imaging of the abdomen and pelvis was performed
using the standard protocol following bolus administration of
intravenous contrast.
CONTRAST:  100 mL Isovue 300

[Series 2: abd/pelvis w/cm · axial · 0.90mm/px · z∈[-450,-45]mm · 13 of 93 slices shown, 18 images]
[im 6/93  soft-tissue]
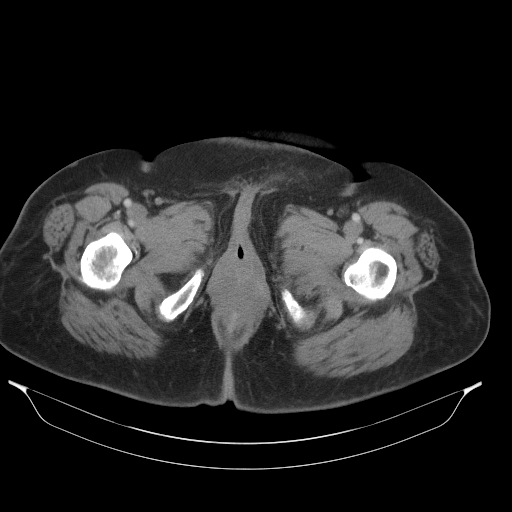
[im 6/93  bone]
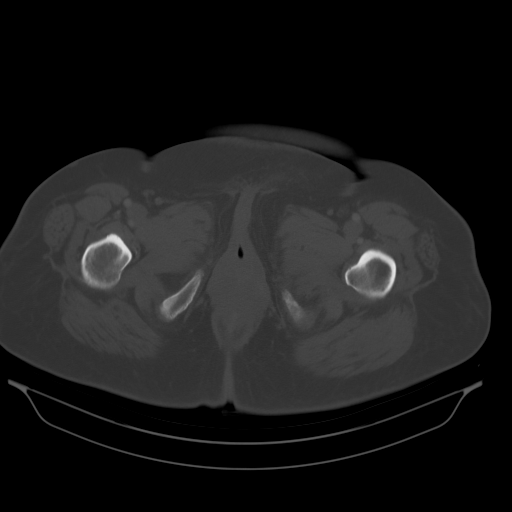
[im 16/93  soft-tissue]
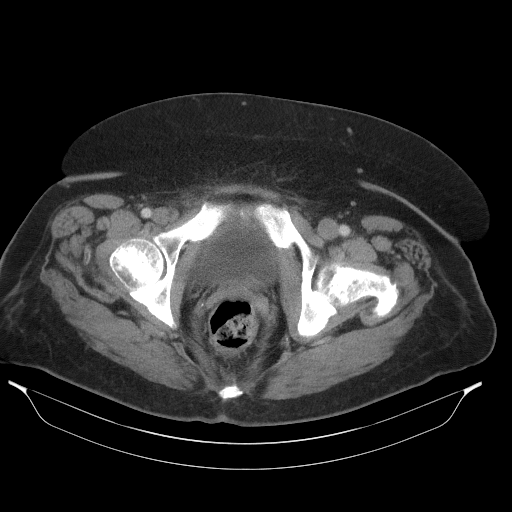
[im 21/93  soft-tissue]
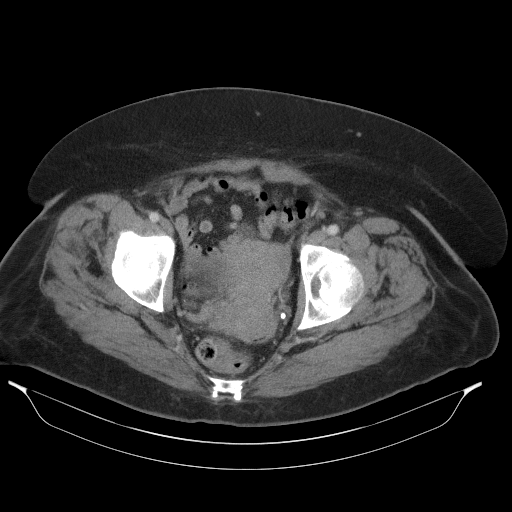
[im 26/93  soft-tissue]
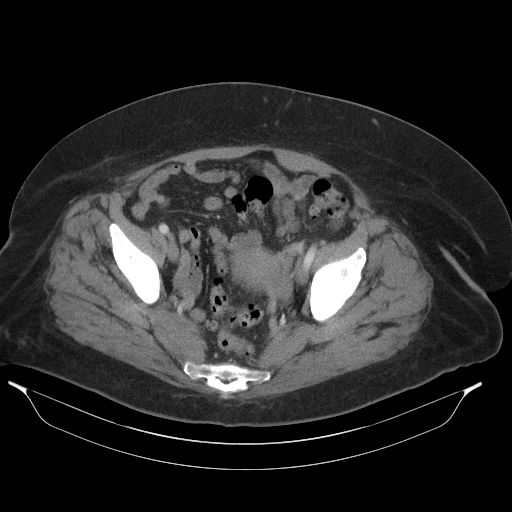
[im 36/93  soft-tissue]
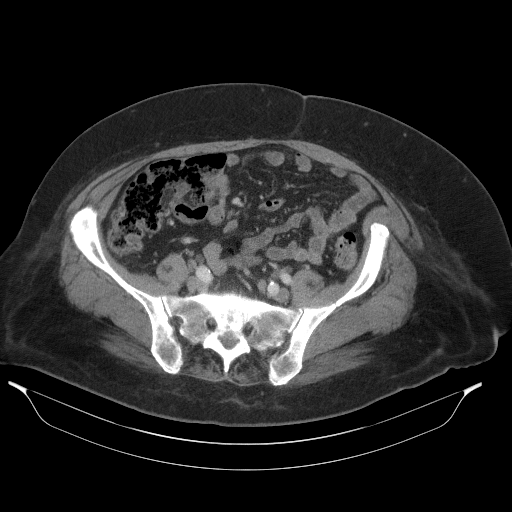
[im 41/93  soft-tissue]
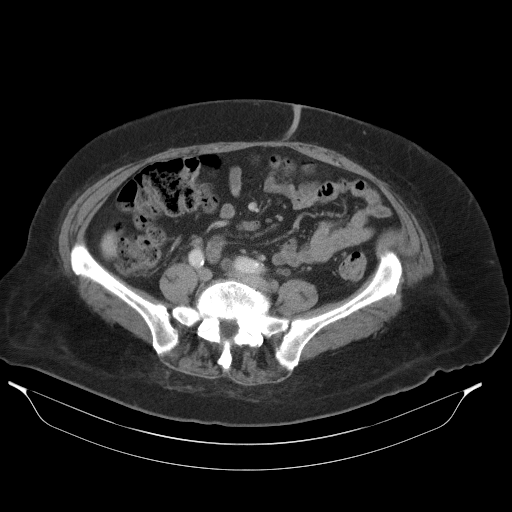
[im 52/93  soft-tissue]
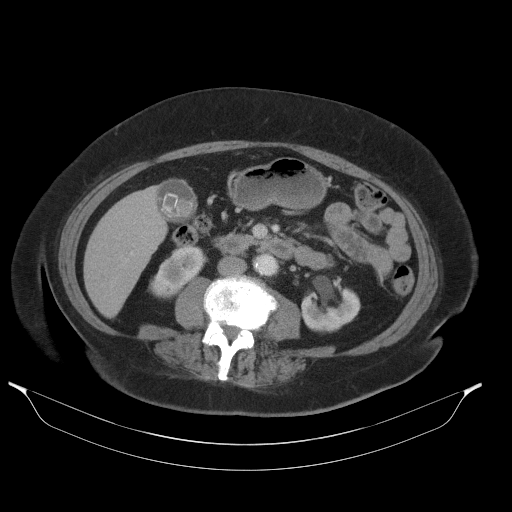
[im 57/93  soft-tissue]
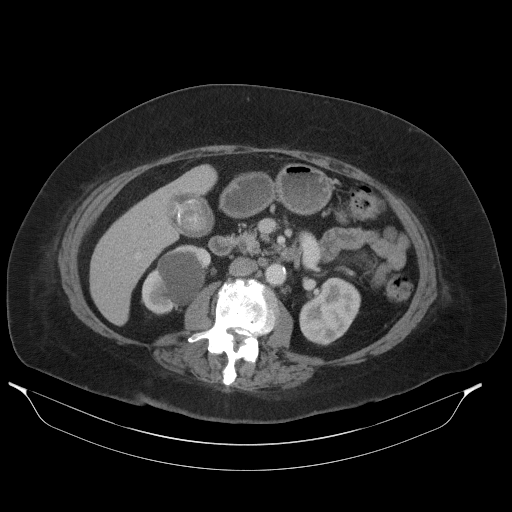
[im 67/93  soft-tissue]
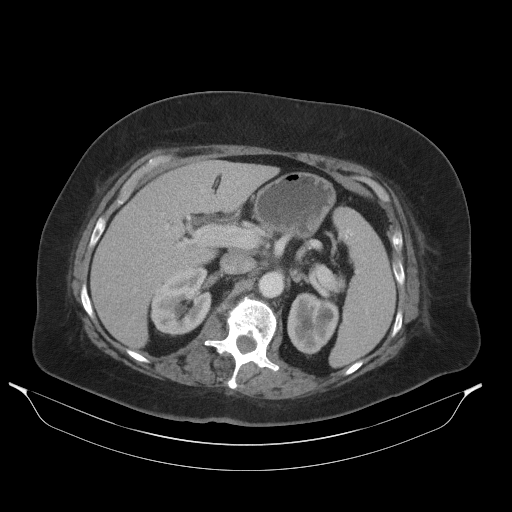
[im 67/93  bone]
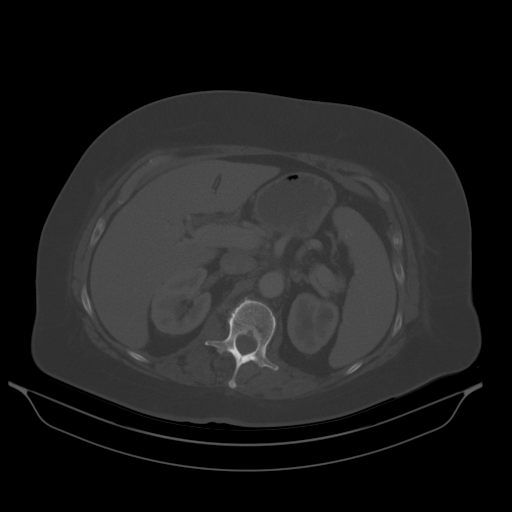
[im 72/93  soft-tissue]
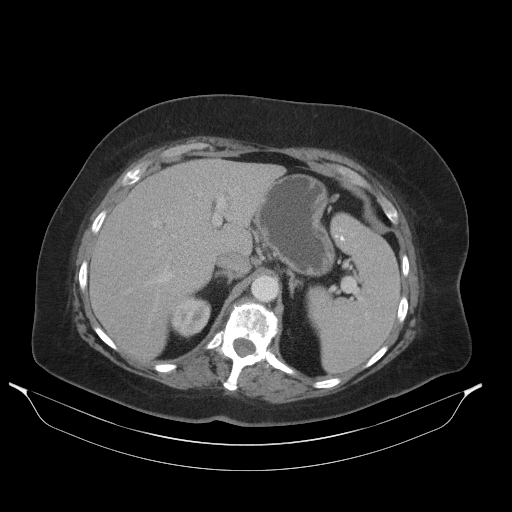
[im 72/93  lung]
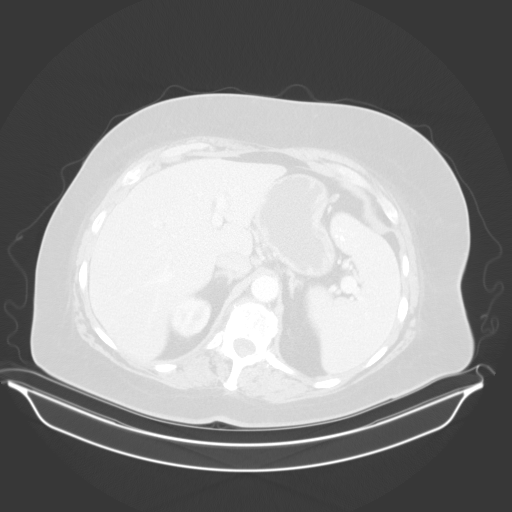
[im 77/93  soft-tissue]
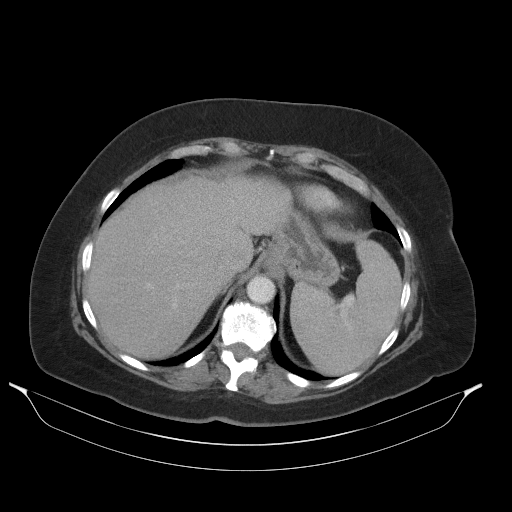
[im 77/93  lung]
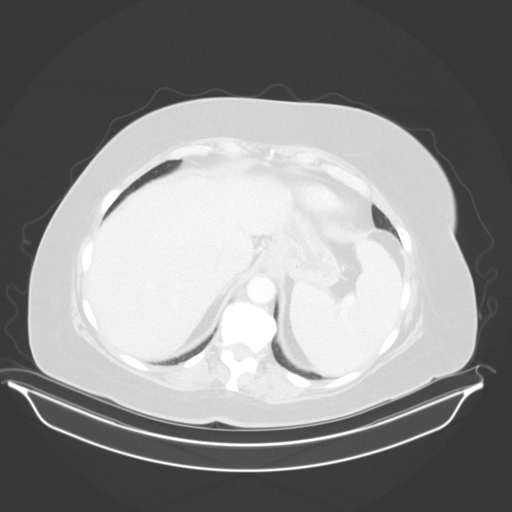
[im 82/93  lung]
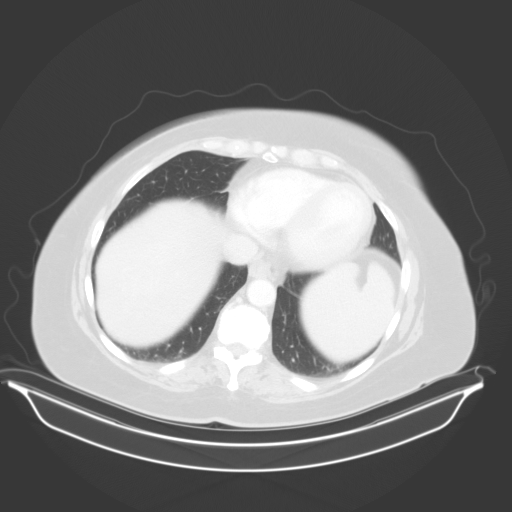
[im 87/93  soft-tissue]
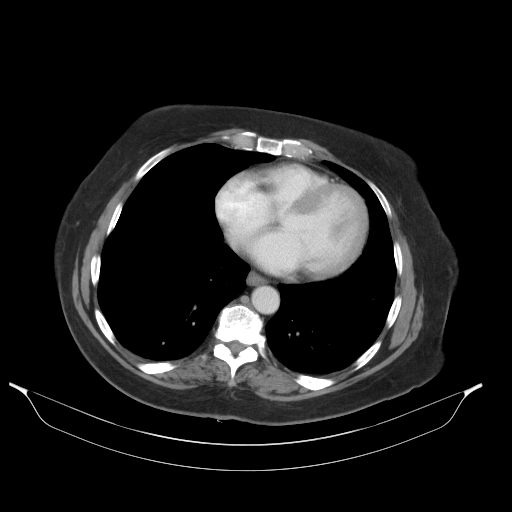
[im 87/93  lung]
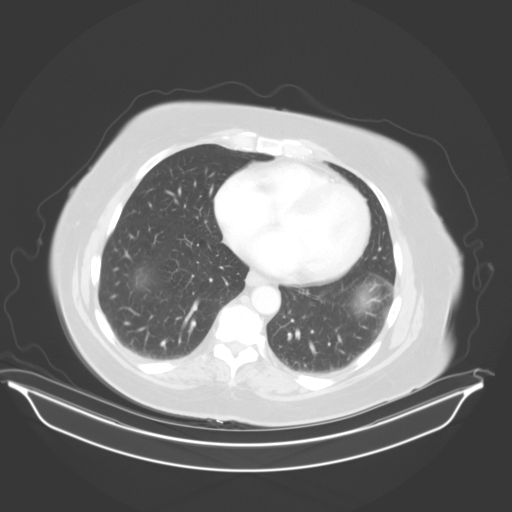

[13 of 32 positions shown; findings below may reference images not displayed]

FINDINGS: Lower chest:  No acute findings.

Hepatobiliary: Stable tiny right hepatic lobe cyst noted. No liver
masses identified. Numerous calcified gallstones are again noted,
however there is no evidence of acute cholecystitis or biliary
ductal dilatation.

Pancreas: No mass, inflammatory changes, or other significant
abnormality.

Spleen: Mild splenomegaly remains stable with spleen measuring
approximately 13-14 cm in length. A stable densely calcified lesion
is again seen in the anterior spleen, consistent with benign
etiology. No other splenic lesions identified.

Adrenals/Urinary Tract: No masses identified. Tiny less than 5 mm
calculi noted lower pole the right kidney upper pole left kidney. No
evidence of ureteral calculi or hydronephrosis. Bilateral
benign-appearing renal cysts again noted. Unopacified urinary
bladder is unremarkable in appearance except for a small amount of
intraluminal gas, likely due to recent instrumentation.

Stomach/Bowel: No evidence of obstruction, inflammatory process, or
abnormal fluid collections. Tiny hiatal hernia again noted.
Diverticulosis is again seen mainly involving the sigmoid colon,
however there is no evidence of diverticulitis. Appendix is not well
visualized, however there is no evidence of inflammatory process in
the area of the cecum.

Vascular/Lymphatic: A 14 mm lymph node is seen in the porta hepatis
on image 25/ series 2 which is stable. No other pathologically
enlarged lymph nodes identified. No evidence of abdominal aortic
aneurysm. Aortic atherosclerotic calcification noted.

Reproductive: No mass or other significant abnormality.

Other: None.

Musculoskeletal: No suspicious bone lesions identified. Advanced
lumbar degenerative disc disease again noted as well as benign
hemangioma in the lower thoracic spine.
IMPRESSION: No acute findings identified within the abdomen or pelvis.

Bilateral nonobstructive nephrolithiasis. No evidence of ureteral
calculi or hydronephrosis.

Cholelithiasis.  No radiographic evidence of cholecystitis.

Colonic diverticulosis. No radiographic evidence of diverticulitis.

Tiny hiatal hernia.

Stable mild splenomegaly, and small calcified splenic lesion
consistent with benign etiology.

## 2018-03-22 DIAGNOSIS — E119 Type 2 diabetes mellitus without complications: Secondary | ICD-10-CM | POA: Diagnosis not present

## 2018-03-22 DIAGNOSIS — E7800 Pure hypercholesterolemia, unspecified: Secondary | ICD-10-CM | POA: Diagnosis not present

## 2018-03-22 DIAGNOSIS — M25551 Pain in right hip: Secondary | ICD-10-CM | POA: Diagnosis not present

## 2018-03-22 DIAGNOSIS — Z0000 Encounter for general adult medical examination without abnormal findings: Secondary | ICD-10-CM | POA: Diagnosis not present

## 2018-03-22 DIAGNOSIS — Z1211 Encounter for screening for malignant neoplasm of colon: Secondary | ICD-10-CM | POA: Diagnosis not present

## 2018-03-22 DIAGNOSIS — M25562 Pain in left knee: Secondary | ICD-10-CM | POA: Diagnosis not present

## 2018-03-22 DIAGNOSIS — Z1389 Encounter for screening for other disorder: Secondary | ICD-10-CM | POA: Diagnosis not present

## 2018-03-22 DIAGNOSIS — I10 Essential (primary) hypertension: Secondary | ICD-10-CM | POA: Diagnosis not present

## 2018-05-11 DIAGNOSIS — H8309 Labyrinthitis, unspecified ear: Secondary | ICD-10-CM | POA: Diagnosis not present

## 2018-05-11 DIAGNOSIS — J069 Acute upper respiratory infection, unspecified: Secondary | ICD-10-CM | POA: Diagnosis not present

## 2018-06-27 ENCOUNTER — Other Ambulatory Visit: Payer: Self-pay | Admitting: Family Medicine

## 2018-06-27 DIAGNOSIS — Z1231 Encounter for screening mammogram for malignant neoplasm of breast: Secondary | ICD-10-CM

## 2018-07-11 DIAGNOSIS — M1712 Unilateral primary osteoarthritis, left knee: Secondary | ICD-10-CM | POA: Diagnosis not present

## 2018-07-11 DIAGNOSIS — M25562 Pain in left knee: Secondary | ICD-10-CM | POA: Diagnosis not present

## 2018-07-11 DIAGNOSIS — M25762 Osteophyte, left knee: Secondary | ICD-10-CM | POA: Diagnosis not present

## 2018-08-11 ENCOUNTER — Other Ambulatory Visit: Payer: Self-pay

## 2018-08-11 ENCOUNTER — Ambulatory Visit
Admission: RE | Admit: 2018-08-11 | Discharge: 2018-08-11 | Disposition: A | Discharge status: Home / Self Care | Payer: Medicare HMO | Source: Ambulatory Visit | Attending: Family Medicine | Admitting: Family Medicine

## 2018-08-11 DIAGNOSIS — Z1231 Encounter for screening mammogram for malignant neoplasm of breast: Secondary | ICD-10-CM | POA: Diagnosis not present

## 2018-08-31 ENCOUNTER — Emergency Department (HOSPITAL_COMMUNITY): Payer: Medicare HMO

## 2018-08-31 ENCOUNTER — Emergency Department (HOSPITAL_COMMUNITY)
Admission: EM | Admit: 2018-08-31 | Discharge: 2018-08-31 | Disposition: A | Discharge status: Home / Self Care | Payer: Medicare HMO | Attending: Emergency Medicine | Admitting: Emergency Medicine

## 2018-08-31 ENCOUNTER — Encounter (HOSPITAL_COMMUNITY): Payer: Self-pay | Admitting: *Deleted

## 2018-08-31 ENCOUNTER — Other Ambulatory Visit: Payer: Self-pay

## 2018-08-31 ENCOUNTER — Encounter: Payer: Self-pay | Admitting: Neurology

## 2018-08-31 DIAGNOSIS — E876 Hypokalemia: Secondary | ICD-10-CM | POA: Diagnosis not present

## 2018-08-31 DIAGNOSIS — D171 Benign lipomatous neoplasm of skin and subcutaneous tissue of trunk: Secondary | ICD-10-CM | POA: Insufficient documentation

## 2018-08-31 DIAGNOSIS — E119 Type 2 diabetes mellitus without complications: Secondary | ICD-10-CM | POA: Diagnosis not present

## 2018-08-31 DIAGNOSIS — R079 Chest pain, unspecified: Secondary | ICD-10-CM

## 2018-08-31 DIAGNOSIS — I10 Essential (primary) hypertension: Secondary | ICD-10-CM | POA: Insufficient documentation

## 2018-08-31 DIAGNOSIS — R202 Paresthesia of skin: Secondary | ICD-10-CM | POA: Insufficient documentation

## 2018-08-31 DIAGNOSIS — Z6833 Body mass index (BMI) 33.0-33.9, adult: Secondary | ICD-10-CM | POA: Diagnosis not present

## 2018-08-31 DIAGNOSIS — E669 Obesity, unspecified: Secondary | ICD-10-CM | POA: Insufficient documentation

## 2018-08-31 DIAGNOSIS — Z79899 Other long term (current) drug therapy: Secondary | ICD-10-CM | POA: Insufficient documentation

## 2018-08-31 DIAGNOSIS — R0789 Other chest pain: Secondary | ICD-10-CM | POA: Diagnosis not present

## 2018-08-31 DIAGNOSIS — R0602 Shortness of breath: Secondary | ICD-10-CM | POA: Insufficient documentation

## 2018-08-31 DIAGNOSIS — R600 Localized edema: Secondary | ICD-10-CM | POA: Insufficient documentation

## 2018-08-31 LAB — CBC WITH DIFFERENTIAL/PLATELET
Abs Immature Granulocytes: 0.02 10*3/uL (ref 0.00–0.07)
Basophils Absolute: 0 10*3/uL (ref 0.0–0.1)
Basophils Relative: 1 %
Eosinophils Absolute: 0.2 10*3/uL (ref 0.0–0.5)
Eosinophils Relative: 3 %
HCT: 37.4 % (ref 36.0–46.0)
Hemoglobin: 12.3 g/dL (ref 12.0–15.0)
Immature Granulocytes: 0 %
Lymphocytes Relative: 21 %
Lymphs Abs: 1.1 10*3/uL (ref 0.7–4.0)
MCH: 27.6 pg (ref 26.0–34.0)
MCHC: 32.9 g/dL (ref 30.0–36.0)
MCV: 84 fL (ref 80.0–100.0)
Monocytes Absolute: 0.4 10*3/uL (ref 0.1–1.0)
Monocytes Relative: 8 %
Neutro Abs: 3.5 10*3/uL (ref 1.7–7.7)
Neutrophils Relative %: 67 %
Platelets: 142 10*3/uL — ABNORMAL LOW (ref 150–400)
RBC: 4.45 MIL/uL (ref 3.87–5.11)
RDW: 14 % (ref 11.5–15.5)
WBC: 5.1 10*3/uL (ref 4.0–10.5)
nRBC: 0 % (ref 0.0–0.2)

## 2018-08-31 LAB — TROPONIN I (HIGH SENSITIVITY)
Troponin I (High Sensitivity): 12 ng/L (ref <18)
Troponin I (High Sensitivity): 8 ng/L (ref <18)

## 2018-08-31 LAB — BASIC METABOLIC PANEL
Anion gap: 11 (ref 5–15)
BUN: 18 mg/dL (ref 8–23)
CO2: 25 mmol/L (ref 22–32)
Calcium: 8.9 mg/dL (ref 8.9–10.3)
Chloride: 106 mmol/L (ref 98–111)
Creatinine, Ser: 0.83 mg/dL (ref 0.44–1.00)
GFR calc Af Amer: >60 mL/min (ref >60)
GFR calc non Af Amer: >60 mL/min (ref >60)
Glucose, Bld: 127 mg/dL — ABNORMAL HIGH (ref 70–99)
Potassium: 3.2 mmol/L — ABNORMAL LOW (ref 3.5–5.1)
Sodium: 142 mmol/L (ref 135–145)

## 2018-08-31 LAB — MAGNESIUM: Magnesium: 1.6 mg/dL — ABNORMAL LOW (ref 1.7–2.4)

## 2018-08-31 MED ORDER — ASPIRIN 81 MG PO CHEW
324.0000 mg | CHEWABLE_TABLET | Freq: Once | ORAL | Status: AC
  Administered 2018-08-31: 324 mg via ORAL
  Filled 2018-08-31: qty 4

## 2018-08-31 MED ORDER — POTASSIUM CHLORIDE CRYS ER 20 MEQ PO TBCR: 20.0000 meq | EXTENDED_RELEASE_TABLET | Freq: Every day | ORAL | 0 refills | Status: DC

## 2018-08-31 MED ORDER — MAG-OXIDE 200 MG PO TABS: 400.0000 mg | ORAL_TABLET | Freq: Every day | ORAL | 0 refills | Status: AC

## 2018-08-31 MED ORDER — MAGNESIUM OXIDE 400 (241.3 MG) MG PO TABS
800.0000 mg | ORAL_TABLET | Freq: Once | ORAL | Status: AC
  Administered 2018-08-31: 800 mg via ORAL
  Filled 2018-08-31: qty 2

## 2018-08-31 MED ORDER — POTASSIUM CHLORIDE CRYS ER 20 MEQ PO TBCR
40.0000 meq | EXTENDED_RELEASE_TABLET | Freq: Once | ORAL | Status: AC
  Administered 2018-08-31: 40 meq via ORAL
  Filled 2018-08-31: qty 2

## 2018-08-31 NOTE — ED Triage Notes (Signed)
Patient presents to ed via [facility] states she woke up this am with numbness and tingling in her left hand. , states her left hand is always numb in the am however it normally wakes up however this am she couldn't get her hand to wake up.  C/o several bouts of intermittent chest pain that lasted for a very short time. Denies headache or blurred vision. Bilateral grips equal.

## 2018-08-31 NOTE — Discharge Instructions (Signed)
If you develop recurrent, continued, or worsening chest pain, shortness of breath, fever, vomiting, abdominal or back pain, weakness or numbness in your extremities, severe headache, or any other new/concerning symptoms then return to the ER for evaluation.

## 2018-08-31 NOTE — ED Provider Notes (Signed)
[facility] EMERGENCY DEPARTMENT Provider Note   CSN: [id number] Arrival date & time: 08/31/18  [id number]     History   Chief Complaint Chief Complaint  Patient presents with  . Hand Pain    HPI Susan Turner is a 73 y.o. female.     HPI  73 year old female presents with left arm numbness.  She states that she has issues with carpal tunnel syndrome and every morning wakes up with numbness in her left arm and has to squeeze a ball and after about 15 minutes it will usually go away.  Last known normal was midnight last night.  This morning she woke up at 630 with numbness but it did not go away like typical.  She states since it was not going away she came by EMS.  The numbness is otherwise in a similar distribution just inferior to her elbow down to her hand.  There is pain in her hand.  This is all very typical except it is lasting way longer than typical.  Just prior to arrival she states the numbness has eased and she has a little bit of soreness in her hand but otherwise her hand and arm feel back to normal.  Never had headache, facial numbness, blurry vision, neck pain, lower extremity weakness or numbness.  She does have some chronic numbness in her lower extremities but no new numbness.  She did briefly have a couple episodes of chest pain that she states only lasted a couple minutes that were hard to describe.  Some transient shortness of breath.  She thinks this might of been anxiety from the arm issue.  Currently is completely asymptomatic.  Past Medical History:  Diagnosis Date  . Anemia   . Arthritis   . Diabetes mellitus without complication ([place])   . Gout   . Hyperlipidemia   . Hypertension   . Iron deficiency anemia   . Lipoma of back   . URI (upper respiratory infection)   . Weight loss     Patient Active Problem List   Diagnosis Date Noted  . Essential hypertension 05/31/2015  . Hyperglycemia 05/31/2015  . Arthritis 05/31/2015  . Gallstone  05/31/2015    Past Surgical History:  Procedure Laterality Date  . JOINT REPLACEMENT    . TOTAL KNEE ARTHROPLASTY Right      OB History   No obstetric history on file.      Home Medications    Prior to Admission medications   Medication Sig Start Date End Date Taking? Authorizing Provider  amLODipine (NORVASC) 5 MG tablet Take 5 mg by mouth 2 (two) times daily.    Yes [provider]  calcium carbonate (TUMS - DOSED IN MG ELEMENTAL CALCIUM) 500 MG chewable tablet Chew 1 tablet by mouth as needed for indigestion or heartburn.   Yes [provider]  ferrous sulfate dried (SLOW FE) 160 (50 FE) MG TBCR Take 160 mg by mouth once a week.    Yes [provider]  gabapentin (NEURONTIN) 100 MG capsule Start on one tablet 3 times a day for pain. If pain is persistent after 2-3 days she can increase to 2 tablets 3 times a day. Patient taking differently: Take 300 mg by mouth 2 (two) times daily.  05/31/15  Yes Darlyne Russian, MD  irbesartan (AVAPRO) 300 MG tablet Take 300 mg by mouth daily. 08/26/18  Yes [provider]  metoprolol tartrate (LOPRESSOR) 100 MG tablet Take 100 mg by mouth  2 (two) times daily.    Yes [provider]  naproxen (NAPROSYN) 500 MG tablet Take 500 mg by mouth 2 (two) times daily with a meal.  08/11/18  Yes [provider]  naproxen sodium (ALEVE) 220 MG tablet Take 220 mg by mouth daily as needed (Headache).    Yes [provider]  Potassium 99 MG TABS Take 99 mg by mouth once a week.   Yes [provider]  rosuvastatin (CRESTOR) 5 MG tablet Take 5 mg by mouth once a week. Sunday morning   Yes [provider]  traMADol (ULTRAM) 50 MG tablet 1 tablet every 6 hours for pain Patient taking differently: Take 50-100 mg by mouth every 6 (six) hours as needed for moderate pain. 1 tablet every 6 hours for pain 05/31/15  Yes Daub, Loura Back, MD  HYDROcodone-acetaminophen (NORCO/VICODIN) 5-325 MG tablet  Take 1 tablet by mouth every 4 (four) hours as needed for moderate pain. Patient not taking: Reported on 05/31/2015 05/24/15   Orpah Greek, MD  Magnesium Oxide (MAG-OXIDE) 200 MG TABS Take 2 tablets (400 mg total) by mouth daily for 3 days. 08/31/18 09/03/18  Sherwood Gambler, MD  potassium chloride SA (K-DUR) 20 MEQ tablet Take 1 tablet (20 mEq total) by mouth daily. 08/31/18   Sherwood Gambler, MD  predniSONE (DELTASONE) 20 MG tablet 3 tabs po daily x 3 days, then 2 tabs x 3 days, then 1.5 tabs x 3 days, then 1 tab x 3 days, then 0.5 tabs x 3 days Patient not taking: Reported on 05/31/2015 05/24/15   Orpah Greek, MD    Family History Family History  Problem Relation Age of Onset  . Hypertension Mother   . Stroke Mother   . Cancer Father     Social History Social History   Tobacco Use  . Smoking status: Never Smoker  . Smokeless tobacco: Never Used  Substance Use Topics  . Alcohol use: No  . Drug use: No     Allergies   Lisinopril, Codeine, and Motrin [ibuprofen]   Review of Systems Review of Systems  Eyes: Negative for visual disturbance.  Respiratory: Positive for shortness of breath.   Cardiovascular: Positive for chest pain and leg swelling (chronic, unchanged).  Musculoskeletal: Positive for arthralgias. Negative for back pain.  Neurological: Positive for numbness. Negative for headaches.  All other systems reviewed and are negative.    Physical Exam Updated Vital Signs BP (!) 170/81 (BP Location: Right Arm)   Pulse (!) 58   Temp 98 F (36.7 C) (Oral)   Resp 16   Ht 5\' 6"  (1.676 m)   Wt 95.3 kg   SpO2 100%   BMI 33.89 kg/m   Physical Exam Vitals signs and nursing note reviewed.  Constitutional:      Appearance: She is well-developed. She is obese.  HENT:     Head: Normocephalic and atraumatic.     Right Ear: External ear normal.     Left Ear: External ear normal.     Nose: Nose normal.  Eyes:     General:        Right eye: No  discharge.        Left eye: No discharge.  Cardiovascular:     Rate and Rhythm: Normal rate and regular rhythm.     Pulses:          Radial pulses are 2+ on the left side.     Heart sounds: Normal heart sounds.  Comments: 2+ ulnar pulse on right, hard to get radial due to lipoma Pulmonary:     Effort: Pulmonary effort is normal.     Breath sounds: Normal breath sounds.  Abdominal:     Palpations: Abdomen is soft.     Tenderness: There is no abdominal tenderness.  Musculoskeletal:       Back:       Arms:     Left hand: She exhibits normal range of motion, no tenderness and no swelling. Normal sensation noted.     Right lower leg: Edema present.     Left lower leg: Edema present.     Comments: Chronic BLE edema  Skin:    General: Skin is warm and dry.  Neurological:     Mental Status: She is alert.     Comments: CN 3-12 grossly intact. 5/5 strength in all 4 extremities. Grossly normal sensation. Normal finger to nose.   Psychiatric:        Mood and Affect: Mood is not anxious.      ED Treatments / Results  Labs (all labs ordered are listed, but only abnormal results are displayed) Labs Reviewed  BASIC METABOLIC PANEL - Abnormal; Notable for the following components:      Result Value   Potassium 3.2 (*)    Glucose, Bld 127 (*)    All other components within normal limits  CBC WITH DIFFERENTIAL/PLATELET - Abnormal; Notable for the following components:   Platelets 142 (*)    All other components within normal limits  MAGNESIUM - Abnormal; Notable for the following components:   Magnesium 1.6 (*)    All other components within normal limits  TROPONIN I (HIGH SENSITIVITY)  TROPONIN I (HIGH SENSITIVITY)    EKG EKG Interpretation  Date/Time:  Thursday August 31 2018 09:39:42 EDT Ventricular Rate:  58 PR Interval:    QRS Duration: 152 QT Interval:  422 QTC Calculation: 415 R Axis:   -50 Text Interpretation:  Sinus rhythm Consider left atrial enlargement Right  bundle branch block LVH with IVCD and secondary repol abnrm Probable lateral infarct, age indeterminate similar to 2013 Confirmed by Sherwood Gambler [phone]) on 08/31/2018 9:48:41 AM   Radiology Dg Chest 2 View  Result Date: 08/31/2018 CLINICAL DATA:  Chest pain. EXAM: CHEST - 2 VIEW COMPARISON:  Radiographs of December 08, 2012. FINDINGS: The heart size and mediastinal contours are within normal limits. Both lungs are clear. No pneumothorax or pleural effusion is noted. The visualized skeletal structures are unremarkable. IMPRESSION: No active cardiopulmonary disease. Electronically Signed   By: Marijo Conception M.D.   On: 08/31/2018 10:14    Procedures Procedures (including critical care time)  Medications Ordered in ED Medications  aspirin chewable tablet 324 mg (324 mg Oral Given 08/31/18 1159)  potassium chloride SA (K-DUR) CR tablet 40 mEq (40 mEq Oral Given 08/31/18 1200)  magnesium oxide (MAG-OX) tablet 800 mg (800 mg Oral Given 08/31/18 1200)     Initial Impression / Assessment and Plan / ED Course  I have reviewed the triage vital signs and the nursing notes.  Pertinent labs & imaging results that were available during my care of the patient were reviewed by me and considered in my medical decision making (see chart for details).        I offered patient MRI given her comorbidities.  I do think this is most likely a peripheral nerve issue and while it is unclear why it lasted so long, the fact that it was  in similar distribution makes me think TIA or stroke is unlikely.  She declines MRI which is pretty reasonable.  Given the transient chest pain I think is reasonable to get screening troponins.  She is asymptomatic now. Highly doubt ACS, PE, dissection. Perhaps the mild hypokalemia and hypomagnesemia contributed. QTC is ok. Will orally replete. D/c home. Return precautions. Will give outpatient neuro follow up.  Final Clinical Impressions(s) / ED Diagnoses   Final diagnoses:   Paresthesia of left upper extremity  Nonspecific chest pain  Hypokalemia  Hypomagnesemia    ED Discharge Orders         Ordered    potassium chloride SA (K-DUR) 20 MEQ tablet  Daily     08/31/18 1312    Magnesium Oxide (MAG-OXIDE) 200 MG TABS  Daily     08/31/18 1312           Sherwood Gambler, MD 08/31/18 1317

## 2018-09-01 ENCOUNTER — Telehealth (INDEPENDENT_AMBULATORY_CARE_PROVIDER_SITE_OTHER): Payer: Medicare HMO | Admitting: Neurology

## 2018-09-01 ENCOUNTER — Other Ambulatory Visit: Payer: Self-pay

## 2018-09-01 VITALS — Ht 66.0 in | Wt 205.0 lb

## 2018-09-01 DIAGNOSIS — G5603 Carpal tunnel syndrome, bilateral upper limbs: Secondary | ICD-10-CM

## 2018-09-01 NOTE — Progress Notes (Signed)
New Patient Virtual Visit via Video Note The purpose of this virtual visit is to provide medical care while limiting exposure to the novel coronavirus.    Consent was obtained for video visit:  Yes.   Answered questions that patient had about telehealth interaction:  Yes.   I discussed the limitations, risks, security and privacy concerns of performing an evaluation and management service by telemedicine. I also discussed with the patient that there may be a patient responsible charge related to this service. The patient expressed understanding and agreed to proceed.  Pt location: Home Physician Location: office Name of referring provider:  Sherwood Gambler, MD I connected with Michell Heinrich Vanderhoff at patients initiation/request on 09/01/2018 at  8:50 AM EDT by video enabled telemedicine application and verified that I am speaking with the correct person using two identifiers. Pt MRN:  [id number] Pt DOB:  05-Dec-1945 Video Participants:  Michell Heinrich Hoston    History of Present Illness: Susan Turner is a 73 y.o. right-handed female with diabetes mellitus, hyperlipidemia, hypertension, and iron deficient anemia presenting for evaluation of left arm numbness.   For the past 10 years, she had spells of numbness/tingling involving the left hand, which is worse in the morning and often wakes her up from sleeping.  She finds herself having to shake her hands which usually wakes them up..  On 08/31/18, she had similar numbness of the left hand, but symptoms lasted for 2 hours, which prompted her to go to the ER, who felt symptoms are most likely due to entrapment neuropathy and referred her to see me..  She is not using a wrist splint. She denies any weakness or neck pain.  She has similar symptoms to a lesser degree in the right hand. 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 Caralyn Guile for this in the past.  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   Past Medical History:  Diagnosis Date  . Anemia   . Arthritis   . Diabetes mellitus without complication ([place])   . Gout   . Hyperlipidemia   . Hypertension   . Iron deficiency anemia   . Lipoma of back   . URI (upper respiratory infection)   . Weight loss     Past Surgical History:  Procedure Laterality Date  . JOINT REPLACEMENT    . TOTAL KNEE ARTHROPLASTY Right      Medications:  Outpatient Encounter Medications as of 09/01/2018  Medication Sig  . amLODipine (NORVASC) 5 MG tablet Take 5 mg by mouth 2 (two) times daily.   . calcium carbonate (TUMS - DOSED IN MG ELEMENTAL CALCIUM) 500 MG chewable tablet Chew 1 tablet by mouth as needed for indigestion or heartburn.  . ferrous sulfate dried (SLOW FE) 160 (50 FE) MG TBCR Take 160 mg by mouth once a week.   . gabapentin (NEURONTIN) 100 MG capsule Start on one tablet 3 times a day for pain. If pain is persistent after 2-3 days she can increase to 2 tablets 3 times a day. (Patient taking differently: Take 300 mg by mouth 2 (two) times daily. )  . irbesartan (AVAPRO) 300 MG tablet Take 300 mg by mouth daily.  . Magnesium Oxide (MAG-OXIDE) 200 MG TABS Take 2 tablets (400 mg total) by mouth daily for 3 days.  Marland Kitchen  metoprolol tartrate (LOPRESSOR) 100 MG tablet Take 100 mg by mouth 2 (two) times daily.   . naproxen (NAPROSYN) 500 MG tablet Take 500 mg by mouth 2 (two) times daily with a meal.   . Potassium 99 MG TABS Take 99 mg by mouth once a week.  . potassium chloride SA (K-DUR) 20 MEQ tablet Take 1 tablet (20 mEq total) by mouth daily.  . rosuvastatin (CRESTOR) 5 MG tablet Take 5 mg by mouth once a week. Sunday morning  . traMADol (ULTRAM) 50 MG tablet 1 tablet every 6 hours for pain (Patient taking differently: Take 50-100 mg by mouth every 6 (six) hours as needed for moderate pain. 1 tablet every 6 hours for pain)  .  HYDROcodone-acetaminophen (NORCO/VICODIN) 5-325 MG tablet Take 1 tablet by mouth every 4 (four) hours as needed for moderate pain. (Patient not taking: Reported on 05/31/2015)  . naproxen sodium (ALEVE) 220 MG tablet Take 220 mg by mouth daily as needed (Headache).   . predniSONE (DELTASONE) 20 MG tablet 3 tabs po daily x 3 days, then 2 tabs x 3 days, then 1.5 tabs x 3 days, then 1 tab x 3 days, then 0.5 tabs x 3 days (Patient not taking: Reported on 05/31/2015)   No facility-administered encounter medications on file as of 09/01/2018.     Allergies:  Allergies  Allergen Reactions  . Lisinopril Cough  . Codeine   . Motrin [Ibuprofen]     Family History: Family History  Problem Relation Age of Onset  . Hypertension Mother   . Stroke Mother   . Cancer Father     Social History: Social History   Tobacco Use  . Smoking status: Never Smoker  . Smokeless tobacco: Never Used  Substance Use Topics  . Alcohol use: No  . Drug use: No   Social History   Social History Narrative   Two story home    Right handed   One daughter   Yolanda Bonine lives with her    12 years and some college    Review of Systems:  CONSTITUTIONAL: No fevers, chills, night sweats, or weight loss.   EYES: No visual changes or eye pain ENT: No hearing changes.  No history of nose bleeds.   RESPIRATORY: No cough, wheezing and shortness of breath.   CARDIOVASCULAR: Negative for chest pain, and palpitations.   GI: Negative for abdominal discomfort, blood in stools or black stools.  No recent change in bowel habits.   GU:  No history of incontinence.   MUSCLOSKELETAL: No history of joint pain or swelling.  No myalgias.   SKIN: Negative for lesions, rash, and itching.   HEMATOLOGY/ONCOLOGY: Negative for prolonged bleeding, bruising easily, and swollen nodes.  No history of cancer.   ENDOCRINE: Negative for cold or heat intolerance, polydipsia or goiter.   PSYCH:  No depression or anxiety symptoms.   NEURO: As  Above.   Vital Signs:  Ht 5\' 6"  (1.676 m)   Wt 205 lb (93 kg)   BMI 33.09 kg/m    General Medical Exam:  Well appearing, comfortable.  Nonlabored breathing.  Large soft his skin lesion, likely lipoma is seen over the back.  She also has a moderately sized lipoma at the base of the right thumb/wrist. No rash.  Neurological Exam: MENTAL STATUS including orientation to time, place, person, recent and remote memory, attention span and concentration, language, and fund of knowledge is normal.  Speech is not dysarthric.  CRANIAL NERVES:  Normal conjugate, extra-ocular  eye movements in all directions of gaze.  No ptosis.  Normal facial symmetry and movements.  Normal shoulder shrug and head rotation.  Tongue is midline.  MOTOR:  Antigravity in all extremities.  No abnormal movements.  No pronator drift.   SENSORY & REFLEXES: Unable to assess.   COORDINATION/GAIT: Normal finger to nose bilaterally.  Intact rapid alternating movements bilaterally.  Gait appears antalgic due to knee pain and is assisted with a cane  IMPRESSION/PLAN: Bilateral carpal tunnel syndrome, worse on the left.  Symptoms have been longstanding and predominantly sensory involvement. Recommend electrodiagnostic testing of the left > right upper extremity to evaluate for entrapment neuropathy and determine severity to help guide management. Start using a wrist splint nightly Avoid hyperflexion at the wrist to minimize compression of the nerve.   Follow Up Instructions:  I discussed the assessment and treatment plan with the patient. The patient was provided an opportunity to ask questions and all were answered. The patient agreed with the plan and demonstrated an understanding of the instructions.   The patient was advised to call back or seek an in-person evaluation if the symptoms worsen or if the condition fails to improve as anticipated.  Return to clinic after testing  Total Time spent:  45 min   [place], DO

## 2018-09-26 DIAGNOSIS — I10 Essential (primary) hypertension: Secondary | ICD-10-CM | POA: Diagnosis not present

## 2018-09-26 DIAGNOSIS — M25562 Pain in left knee: Secondary | ICD-10-CM | POA: Diagnosis not present

## 2018-09-26 DIAGNOSIS — E7800 Pure hypercholesterolemia, unspecified: Secondary | ICD-10-CM | POA: Diagnosis not present

## 2018-09-26 DIAGNOSIS — Z1211 Encounter for screening for malignant neoplasm of colon: Secondary | ICD-10-CM | POA: Diagnosis not present

## 2018-09-26 DIAGNOSIS — M25551 Pain in right hip: Secondary | ICD-10-CM | POA: Diagnosis not present

## 2018-09-26 DIAGNOSIS — E119 Type 2 diabetes mellitus without complications: Secondary | ICD-10-CM | POA: Diagnosis not present

## 2018-10-10 ENCOUNTER — Ambulatory Visit (INDEPENDENT_AMBULATORY_CARE_PROVIDER_SITE_OTHER): Payer: Medicare HMO | Admitting: Neurology

## 2018-10-10 ENCOUNTER — Other Ambulatory Visit: Payer: Self-pay

## 2018-10-10 DIAGNOSIS — G5603 Carpal tunnel syndrome, bilateral upper limbs: Secondary | ICD-10-CM | POA: Diagnosis not present

## 2018-10-10 NOTE — Progress Notes (Signed)
Follow-up Visit   Date: 10/10/18   Susan Turner MRN: [id number] DOB: 08-Jun-1945   Interim History: Susan Turner is a 73 y.o. right-handed female with diabetes mellitus, hyperlipidemia, hypertension, and iron deficient anemia returning to the clinic for follow-up of bilateral hand numbness.  The patient was accompanied to the clinic by self.  History of present illness: For the past 10 years, she had spells of numbness/tingling involving the left hand, which is worse in the morning and often wakes her up from sleeping.  She finds herself having to shake her hands which usually wakes them up..  On 08/31/18, she had similar numbness of the left hand, but symptoms lasted for 2 hours, which prompted her to go to the ER, who felt symptoms are most likely due to entrapment neuropathy and referred her to see me..  She is not using a wrist splint. She denies any weakness or neck pain.  She has similar symptoms to a lesser degree in the right hand. 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 Caralyn Guile for this in the past.  She has been told she has CTS bilaterally, no formal diagnostic testing has been done.  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  UPDATE 10/10/2018:  She is here for follow-up visit and electrodiagnostic testing of the hands.  She continues to report bilateral hand numbness/tingling, worse on the left.  She has a lipoma over the right lateral wrist, which is expanding.  She was supposed to get this excised, however, due to [place], this has been delayed.    Medications:  Current Outpatient Medications on File Prior to Visit  Medication Sig Dispense Refill  . amLODipine (NORVASC) 5 MG tablet Take 5 mg by mouth 2 (two) times daily.     . calcium carbonate (TUMS - DOSED IN MG ELEMENTAL CALCIUM) 500 MG chewable tablet Chew 1 tablet by mouth as needed for indigestion or heartburn.    . ferrous sulfate dried  (SLOW FE) 160 (50 FE) MG TBCR Take 160 mg by mouth once a week.     . gabapentin (NEURONTIN) 100 MG capsule Start on one tablet 3 times a day for pain. If pain is persistent after 2-3 days she can increase to 2 tablets 3 times a day. (Patient taking differently: Take 300 mg by mouth 2 (two) times daily. ) 90 capsule 3  . HYDROcodone-acetaminophen (NORCO/VICODIN) 5-325 MG tablet Take 1 tablet by mouth every 4 (four) hours as needed for moderate pain. (Patient not taking: Reported on 05/31/2015) 15 tablet 0  . irbesartan (AVAPRO) 300 MG tablet Take 300 mg by mouth daily.    . metoprolol tartrate (LOPRESSOR) 100 MG tablet Take 100 mg by mouth 2 (two) times daily.     . naproxen (NAPROSYN) 500 MG tablet Take 500 mg by mouth 2 (two) times daily with a meal.     . naproxen sodium (ALEVE) 220 MG tablet Take 220 mg by mouth daily as needed (Headache).     . Potassium 99 MG TABS Take 99 mg by mouth once a week.    . potassium chloride SA (K-DUR) 20 MEQ tablet Take 1 tablet (20 mEq total) by mouth daily. 3 tablet 0  . predniSONE (DELTASONE) 20 MG tablet 3 tabs po daily x 3 days, then 2 tabs x 3 days, then 1.5 tabs x 3 days, then 1 tab x 3 days,  then 0.5 tabs x 3 days (Patient not taking: Reported on 05/31/2015) 27 tablet 0  . rosuvastatin (CRESTOR) 5 MG tablet Take 5 mg by mouth once a week. Sunday morning    . traMADol (ULTRAM) 50 MG tablet 1 tablet every 6 hours for pain (Patient taking differently: Take 50-100 mg by mouth every 6 (six) hours as needed for moderate pain. 1 tablet every 6 hours for pain) 30 tablet 0   No current facility-administered medications on file prior to visit.     Allergies:  Allergies  Allergen Reactions  . Lisinopril Cough  . Codeine   . Motrin [Ibuprofen]     Review of Systems:  CONSTITUTIONAL: No fevers, chills, night sweats, or weight loss.  EYES: No visual changes or eye pain ENT: No hearing changes.  No history of nose bleeds.   RESPIRATORY: No cough, wheezing and  shortness of breath.   CARDIOVASCULAR: Negative for chest pain, and palpitations.   GI: Negative for abdominal discomfort, blood in stools or black stools.  No recent change in bowel habits.   GU:  No history of incontinence.   MUSCLOSKELETAL: No history of joint pain or swelling.  No myalgias.   SKIN: +lesions, rash, and itching.   ENDOCRINE: Negative for cold or heat intolerance, polydipsia or goiter.   PSYCH:  No depression or anxiety symptoms.   NEURO: As Above.   General Medical Exam:   General:  Well appearing, comfortable  Eyes/ENT: see cranial nerve examination.   Neck:  No carotid bruits. Respiratory:  Clear to auscultation, good air entry bilaterally.   Cardiac:  Regular rate and rhythm, no murmur.   Ext:  Large mobile soft tissue mass over the lateral wrist at the base of the thumb   Neurological Exam: MENTAL STATUS including orientation to time, place, person, recent and remote memory, attention span and concentration, language, and fund of knowledge is normal.  Speech is not dysarthric.  CRANIAL NERVES:    Pupils equal round and reactive to light.  Normal conjugate, extra-ocular eye movements in all directions of gaze.  No ptosis   MOTOR:  Motor strength is 5/5 in all extremities, except bilateral ABP is 4/5.  Marked right >> left ABP atrophy.     MSRs:  Reflexes are 2+/4 throughout.  COORDINATION/GAIT:  Stooped posture due to large lipoma over the back, stable and unassisted  Data: NCS/EMG of the arms 10/10/2018: 1. Bilateral median neuropathy at or distal to the wrist (severe), consistent with a clinical diagnosis of carpal tunnel syndrome.   2. Incidentally, there is evidence of bilateral Martin-Gruber anastomoses, a normal anatomic variant.   IMPRESSION/PLAN: Bilateral carpal tunnel syndrome, severe.  Based on the severity of findings, it is not surprising that she is not having any benefit with wrist splints.  I recommend that she discuss with Dr. Caralyn Guile about  surgical options and/or injection to alleviate pain.    Greater than 50% of this 15 minute visit was spent in counseling, explanation of diagnosis, planning of further management, and coordination of care.   Thank you for allowing me to participate in patient's care.  If I can answer any additional questions, I would be pleased to do so.    Sincerely,     K. Posey Pronto, DO

## 2018-10-12 ENCOUNTER — Telehealth: Payer: Self-pay

## 2018-10-12 NOTE — Telephone Encounter (Signed)
Emg results and last OV notes faxed to Dr. Caralyn Guile per Posey Pronto at [phone]

## 2018-10-13 DIAGNOSIS — E119 Type 2 diabetes mellitus without complications: Secondary | ICD-10-CM | POA: Diagnosis not present

## 2018-10-13 DIAGNOSIS — I10 Essential (primary) hypertension: Secondary | ICD-10-CM | POA: Diagnosis not present

## 2018-10-13 DIAGNOSIS — E7800 Pure hypercholesterolemia, unspecified: Secondary | ICD-10-CM | POA: Diagnosis not present

## 2018-10-20 DIAGNOSIS — Z1211 Encounter for screening for malignant neoplasm of colon: Secondary | ICD-10-CM | POA: Diagnosis not present

## 2018-10-31 DIAGNOSIS — D1721 Benign lipomatous neoplasm of skin and subcutaneous tissue of right arm: Secondary | ICD-10-CM | POA: Diagnosis not present

## 2018-10-31 DIAGNOSIS — G5603 Carpal tunnel syndrome, bilateral upper limbs: Secondary | ICD-10-CM | POA: Diagnosis not present

## 2019-03-19 DIAGNOSIS — E119 Type 2 diabetes mellitus without complications: Secondary | ICD-10-CM | POA: Diagnosis not present

## 2019-03-20 DIAGNOSIS — E7800 Pure hypercholesterolemia, unspecified: Secondary | ICD-10-CM | POA: Diagnosis not present

## 2019-03-20 DIAGNOSIS — E1169 Type 2 diabetes mellitus with other specified complication: Secondary | ICD-10-CM | POA: Diagnosis not present

## 2019-03-20 DIAGNOSIS — I10 Essential (primary) hypertension: Secondary | ICD-10-CM | POA: Diagnosis not present

## 2019-03-20 DIAGNOSIS — D509 Iron deficiency anemia, unspecified: Secondary | ICD-10-CM | POA: Diagnosis not present

## 2019-03-26 DIAGNOSIS — E1169 Type 2 diabetes mellitus with other specified complication: Secondary | ICD-10-CM | POA: Diagnosis not present

## 2019-03-26 DIAGNOSIS — Z1389 Encounter for screening for other disorder: Secondary | ICD-10-CM | POA: Diagnosis not present

## 2019-03-26 DIAGNOSIS — I10 Essential (primary) hypertension: Secondary | ICD-10-CM | POA: Diagnosis not present

## 2019-03-26 DIAGNOSIS — M25562 Pain in left knee: Secondary | ICD-10-CM | POA: Diagnosis not present

## 2019-03-26 DIAGNOSIS — E7800 Pure hypercholesterolemia, unspecified: Secondary | ICD-10-CM | POA: Diagnosis not present

## 2019-03-26 DIAGNOSIS — M25551 Pain in right hip: Secondary | ICD-10-CM | POA: Diagnosis not present

## 2019-03-26 DIAGNOSIS — Z0000 Encounter for general adult medical examination without abnormal findings: Secondary | ICD-10-CM | POA: Diagnosis not present

## 2019-05-14 DIAGNOSIS — D509 Iron deficiency anemia, unspecified: Secondary | ICD-10-CM | POA: Diagnosis not present

## 2019-07-04 DIAGNOSIS — E7800 Pure hypercholesterolemia, unspecified: Secondary | ICD-10-CM | POA: Diagnosis not present

## 2019-07-04 DIAGNOSIS — D509 Iron deficiency anemia, unspecified: Secondary | ICD-10-CM | POA: Diagnosis not present

## 2019-07-04 DIAGNOSIS — I10 Essential (primary) hypertension: Secondary | ICD-10-CM | POA: Diagnosis not present

## 2019-07-04 DIAGNOSIS — E1169 Type 2 diabetes mellitus with other specified complication: Secondary | ICD-10-CM | POA: Diagnosis not present

## 2019-08-01 DIAGNOSIS — L03115 Cellulitis of right lower limb: Secondary | ICD-10-CM | POA: Diagnosis not present

## 2019-08-01 DIAGNOSIS — E119 Type 2 diabetes mellitus without complications: Secondary | ICD-10-CM | POA: Diagnosis not present

## 2019-08-01 DIAGNOSIS — M25561 Pain in right knee: Secondary | ICD-10-CM | POA: Diagnosis not present

## 2019-08-08 DIAGNOSIS — L03115 Cellulitis of right lower limb: Secondary | ICD-10-CM | POA: Diagnosis not present

## 2019-08-08 DIAGNOSIS — E119 Type 2 diabetes mellitus without complications: Secondary | ICD-10-CM | POA: Diagnosis not present

## 2019-08-13 DIAGNOSIS — L03115 Cellulitis of right lower limb: Secondary | ICD-10-CM | POA: Diagnosis not present

## 2019-08-13 DIAGNOSIS — E1169 Type 2 diabetes mellitus with other specified complication: Secondary | ICD-10-CM | POA: Diagnosis not present

## 2019-08-13 DIAGNOSIS — I80291 Phlebitis and thrombophlebitis of other deep vessels of right lower extremity: Secondary | ICD-10-CM | POA: Diagnosis not present

## 2019-08-29 DIAGNOSIS — I80291 Phlebitis and thrombophlebitis of other deep vessels of right lower extremity: Secondary | ICD-10-CM | POA: Diagnosis not present

## 2019-08-29 DIAGNOSIS — L03115 Cellulitis of right lower limb: Secondary | ICD-10-CM | POA: Diagnosis not present

## 2019-08-29 DIAGNOSIS — M7989 Other specified soft tissue disorders: Secondary | ICD-10-CM | POA: Diagnosis not present

## 2019-09-05 DIAGNOSIS — Z96651 Presence of right artificial knee joint: Secondary | ICD-10-CM | POA: Diagnosis not present

## 2019-09-05 DIAGNOSIS — M25561 Pain in right knee: Secondary | ICD-10-CM | POA: Diagnosis not present

## 2019-09-05 DIAGNOSIS — L03115 Cellulitis of right lower limb: Secondary | ICD-10-CM | POA: Diagnosis not present

## 2019-09-21 DIAGNOSIS — D509 Iron deficiency anemia, unspecified: Secondary | ICD-10-CM | POA: Diagnosis not present

## 2019-09-21 DIAGNOSIS — E1169 Type 2 diabetes mellitus with other specified complication: Secondary | ICD-10-CM | POA: Diagnosis not present

## 2019-09-21 DIAGNOSIS — I10 Essential (primary) hypertension: Secondary | ICD-10-CM | POA: Diagnosis not present

## 2019-09-21 DIAGNOSIS — E119 Type 2 diabetes mellitus without complications: Secondary | ICD-10-CM | POA: Diagnosis not present

## 2019-09-21 DIAGNOSIS — E7800 Pure hypercholesterolemia, unspecified: Secondary | ICD-10-CM | POA: Diagnosis not present

## 2019-09-24 DIAGNOSIS — E7800 Pure hypercholesterolemia, unspecified: Secondary | ICD-10-CM | POA: Diagnosis not present

## 2019-09-24 DIAGNOSIS — M25551 Pain in right hip: Secondary | ICD-10-CM | POA: Diagnosis not present

## 2019-09-24 DIAGNOSIS — I10 Essential (primary) hypertension: Secondary | ICD-10-CM | POA: Diagnosis not present

## 2019-09-24 DIAGNOSIS — M25562 Pain in left knee: Secondary | ICD-10-CM | POA: Diagnosis not present

## 2019-09-24 DIAGNOSIS — Z1211 Encounter for screening for malignant neoplasm of colon: Secondary | ICD-10-CM | POA: Diagnosis not present

## 2019-09-24 DIAGNOSIS — E1169 Type 2 diabetes mellitus with other specified complication: Secondary | ICD-10-CM | POA: Diagnosis not present

## 2019-10-17 DIAGNOSIS — Z1211 Encounter for screening for malignant neoplasm of colon: Secondary | ICD-10-CM | POA: Diagnosis not present

## 2019-11-15 DIAGNOSIS — M65331 Trigger finger, right middle finger: Secondary | ICD-10-CM | POA: Diagnosis not present

## 2019-11-27 DIAGNOSIS — H6121 Impacted cerumen, right ear: Secondary | ICD-10-CM | POA: Diagnosis not present

## 2019-12-13 DIAGNOSIS — M20011 Mallet finger of right finger(s): Secondary | ICD-10-CM | POA: Diagnosis not present

## 2019-12-13 DIAGNOSIS — D1721 Benign lipomatous neoplasm of skin and subcutaneous tissue of right arm: Secondary | ICD-10-CM | POA: Diagnosis not present

## 2019-12-13 DIAGNOSIS — G5601 Carpal tunnel syndrome, right upper limb: Secondary | ICD-10-CM | POA: Diagnosis not present

## 2019-12-17 DIAGNOSIS — M20011 Mallet finger of right finger(s): Secondary | ICD-10-CM | POA: Diagnosis not present

## 2019-12-17 DIAGNOSIS — D2111 Benign neoplasm of connective and other soft tissue of right upper limb, including shoulder: Secondary | ICD-10-CM | POA: Diagnosis not present

## 2019-12-17 DIAGNOSIS — D1721 Benign lipomatous neoplasm of skin and subcutaneous tissue of right arm: Secondary | ICD-10-CM | POA: Diagnosis not present

## 2019-12-17 DIAGNOSIS — R2231 Localized swelling, mass and lump, right upper limb: Secondary | ICD-10-CM | POA: Diagnosis not present

## 2019-12-17 DIAGNOSIS — M65831 Other synovitis and tenosynovitis, right forearm: Secondary | ICD-10-CM | POA: Diagnosis not present

## 2019-12-19 DIAGNOSIS — I10 Essential (primary) hypertension: Secondary | ICD-10-CM | POA: Diagnosis not present

## 2019-12-19 DIAGNOSIS — E7800 Pure hypercholesterolemia, unspecified: Secondary | ICD-10-CM | POA: Diagnosis not present

## 2019-12-19 DIAGNOSIS — E1169 Type 2 diabetes mellitus with other specified complication: Secondary | ICD-10-CM | POA: Diagnosis not present

## 2019-12-19 DIAGNOSIS — D509 Iron deficiency anemia, unspecified: Secondary | ICD-10-CM | POA: Diagnosis not present

## 2020-01-01 DIAGNOSIS — M20011 Mallet finger of right finger(s): Secondary | ICD-10-CM | POA: Diagnosis not present

## 2020-01-01 DIAGNOSIS — D1721 Benign lipomatous neoplasm of skin and subcutaneous tissue of right arm: Secondary | ICD-10-CM | POA: Diagnosis not present

## 2020-01-01 DIAGNOSIS — Z4789 Encounter for other orthopedic aftercare: Secondary | ICD-10-CM | POA: Diagnosis not present

## 2020-01-04 DIAGNOSIS — H524 Presbyopia: Secondary | ICD-10-CM | POA: Diagnosis not present

## 2020-01-04 DIAGNOSIS — Z0100 Encounter for examination of eyes and vision without abnormal findings: Secondary | ICD-10-CM | POA: Diagnosis not present

## 2020-01-04 DIAGNOSIS — E118 Type 2 diabetes mellitus with unspecified complications: Secondary | ICD-10-CM | POA: Diagnosis not present

## 2020-01-04 DIAGNOSIS — I10 Essential (primary) hypertension: Secondary | ICD-10-CM | POA: Diagnosis not present

## 2020-01-29 DIAGNOSIS — Z4789 Encounter for other orthopedic aftercare: Secondary | ICD-10-CM | POA: Diagnosis not present

## 2020-01-29 DIAGNOSIS — M20011 Mallet finger of right finger(s): Secondary | ICD-10-CM | POA: Diagnosis not present

## 2020-01-29 DIAGNOSIS — D1721 Benign lipomatous neoplasm of skin and subcutaneous tissue of right arm: Secondary | ICD-10-CM | POA: Diagnosis not present

## 2020-02-12 DIAGNOSIS — M20011 Mallet finger of right finger(s): Secondary | ICD-10-CM | POA: Diagnosis not present

## 2020-02-12 DIAGNOSIS — Z4789 Encounter for other orthopedic aftercare: Secondary | ICD-10-CM | POA: Diagnosis not present

## 2020-02-12 DIAGNOSIS — D1721 Benign lipomatous neoplasm of skin and subcutaneous tissue of right arm: Secondary | ICD-10-CM | POA: Diagnosis not present

## 2020-02-27 DIAGNOSIS — E119 Type 2 diabetes mellitus without complications: Secondary | ICD-10-CM | POA: Diagnosis not present

## 2020-02-27 DIAGNOSIS — E7800 Pure hypercholesterolemia, unspecified: Secondary | ICD-10-CM | POA: Diagnosis not present

## 2020-02-27 DIAGNOSIS — E1169 Type 2 diabetes mellitus with other specified complication: Secondary | ICD-10-CM | POA: Diagnosis not present

## 2020-02-27 DIAGNOSIS — I10 Essential (primary) hypertension: Secondary | ICD-10-CM | POA: Diagnosis not present

## 2020-02-27 DIAGNOSIS — D509 Iron deficiency anemia, unspecified: Secondary | ICD-10-CM | POA: Diagnosis not present

## 2020-03-11 DIAGNOSIS — D1721 Benign lipomatous neoplasm of skin and subcutaneous tissue of right arm: Secondary | ICD-10-CM | POA: Diagnosis not present

## 2020-03-11 DIAGNOSIS — M20011 Mallet finger of right finger(s): Secondary | ICD-10-CM | POA: Diagnosis not present

## 2020-03-11 DIAGNOSIS — Z4789 Encounter for other orthopedic aftercare: Secondary | ICD-10-CM | POA: Diagnosis not present

## 2020-03-24 DIAGNOSIS — D509 Iron deficiency anemia, unspecified: Secondary | ICD-10-CM | POA: Diagnosis not present

## 2020-03-24 DIAGNOSIS — E7800 Pure hypercholesterolemia, unspecified: Secondary | ICD-10-CM | POA: Diagnosis not present

## 2020-03-24 DIAGNOSIS — E1169 Type 2 diabetes mellitus with other specified complication: Secondary | ICD-10-CM | POA: Diagnosis not present

## 2020-03-24 DIAGNOSIS — I10 Essential (primary) hypertension: Secondary | ICD-10-CM | POA: Diagnosis not present

## 2020-03-27 DIAGNOSIS — Z1389 Encounter for screening for other disorder: Secondary | ICD-10-CM | POA: Diagnosis not present

## 2020-03-27 DIAGNOSIS — E7800 Pure hypercholesterolemia, unspecified: Secondary | ICD-10-CM | POA: Diagnosis not present

## 2020-03-27 DIAGNOSIS — E1169 Type 2 diabetes mellitus with other specified complication: Secondary | ICD-10-CM | POA: Diagnosis not present

## 2020-03-27 DIAGNOSIS — M25562 Pain in left knee: Secondary | ICD-10-CM | POA: Diagnosis not present

## 2020-03-27 DIAGNOSIS — Z0000 Encounter for general adult medical examination without abnormal findings: Secondary | ICD-10-CM | POA: Diagnosis not present

## 2020-03-27 DIAGNOSIS — I10 Essential (primary) hypertension: Secondary | ICD-10-CM | POA: Diagnosis not present

## 2020-03-27 DIAGNOSIS — M25551 Pain in right hip: Secondary | ICD-10-CM | POA: Diagnosis not present

## 2020-05-05 DIAGNOSIS — Z1211 Encounter for screening for malignant neoplasm of colon: Secondary | ICD-10-CM | POA: Diagnosis not present

## 2020-05-06 DIAGNOSIS — Z1211 Encounter for screening for malignant neoplasm of colon: Secondary | ICD-10-CM | POA: Diagnosis not present

## 2020-05-06 DIAGNOSIS — D509 Iron deficiency anemia, unspecified: Secondary | ICD-10-CM | POA: Diagnosis not present

## 2020-06-03 DIAGNOSIS — I10 Essential (primary) hypertension: Secondary | ICD-10-CM | POA: Diagnosis not present

## 2020-06-03 DIAGNOSIS — E1169 Type 2 diabetes mellitus with other specified complication: Secondary | ICD-10-CM | POA: Diagnosis not present

## 2020-06-03 DIAGNOSIS — D509 Iron deficiency anemia, unspecified: Secondary | ICD-10-CM | POA: Diagnosis not present

## 2020-06-03 DIAGNOSIS — E7800 Pure hypercholesterolemia, unspecified: Secondary | ICD-10-CM | POA: Diagnosis not present

## 2020-06-03 DIAGNOSIS — E119 Type 2 diabetes mellitus without complications: Secondary | ICD-10-CM | POA: Diagnosis not present

## 2020-06-30 DIAGNOSIS — I10 Essential (primary) hypertension: Secondary | ICD-10-CM | POA: Diagnosis not present

## 2020-06-30 DIAGNOSIS — E1169 Type 2 diabetes mellitus with other specified complication: Secondary | ICD-10-CM | POA: Diagnosis not present

## 2020-06-30 DIAGNOSIS — D509 Iron deficiency anemia, unspecified: Secondary | ICD-10-CM | POA: Diagnosis not present

## 2020-06-30 DIAGNOSIS — E119 Type 2 diabetes mellitus without complications: Secondary | ICD-10-CM | POA: Diagnosis not present

## 2020-06-30 DIAGNOSIS — E7800 Pure hypercholesterolemia, unspecified: Secondary | ICD-10-CM | POA: Diagnosis not present

## 2020-09-29 DIAGNOSIS — M25561 Pain in right knee: Secondary | ICD-10-CM | POA: Diagnosis not present

## 2020-09-29 DIAGNOSIS — E1169 Type 2 diabetes mellitus with other specified complication: Secondary | ICD-10-CM | POA: Diagnosis not present

## 2020-09-29 DIAGNOSIS — M25562 Pain in left knee: Secondary | ICD-10-CM | POA: Diagnosis not present

## 2020-09-29 DIAGNOSIS — E7800 Pure hypercholesterolemia, unspecified: Secondary | ICD-10-CM | POA: Diagnosis not present

## 2020-09-29 DIAGNOSIS — I77819 Aortic ectasia, unspecified site: Secondary | ICD-10-CM | POA: Diagnosis not present

## 2020-09-29 DIAGNOSIS — I10 Essential (primary) hypertension: Secondary | ICD-10-CM | POA: Diagnosis not present

## 2020-09-29 DIAGNOSIS — M25551 Pain in right hip: Secondary | ICD-10-CM | POA: Diagnosis not present

## 2020-09-29 DIAGNOSIS — M79602 Pain in left arm: Secondary | ICD-10-CM | POA: Diagnosis not present

## 2020-09-29 DIAGNOSIS — D509 Iron deficiency anemia, unspecified: Secondary | ICD-10-CM | POA: Diagnosis not present

## 2020-10-10 DIAGNOSIS — E1169 Type 2 diabetes mellitus with other specified complication: Secondary | ICD-10-CM | POA: Diagnosis not present

## 2020-12-02 DIAGNOSIS — E7800 Pure hypercholesterolemia, unspecified: Secondary | ICD-10-CM | POA: Diagnosis not present

## 2020-12-02 DIAGNOSIS — E119 Type 2 diabetes mellitus without complications: Secondary | ICD-10-CM | POA: Diagnosis not present

## 2020-12-02 DIAGNOSIS — E1169 Type 2 diabetes mellitus with other specified complication: Secondary | ICD-10-CM | POA: Diagnosis not present

## 2020-12-02 DIAGNOSIS — I10 Essential (primary) hypertension: Secondary | ICD-10-CM | POA: Diagnosis not present

## 2020-12-02 DIAGNOSIS — D509 Iron deficiency anemia, unspecified: Secondary | ICD-10-CM | POA: Diagnosis not present

## 2021-01-07 DIAGNOSIS — Z20822 Contact with and (suspected) exposure to covid-19: Secondary | ICD-10-CM | POA: Diagnosis not present

## 2021-01-07 DIAGNOSIS — U071 COVID-19: Secondary | ICD-10-CM | POA: Diagnosis not present

## 2021-02-04 DIAGNOSIS — H524 Presbyopia: Secondary | ICD-10-CM | POA: Diagnosis not present

## 2021-02-04 DIAGNOSIS — H3562 Retinal hemorrhage, left eye: Secondary | ICD-10-CM | POA: Diagnosis not present

## 2021-02-04 DIAGNOSIS — H52221 Regular astigmatism, right eye: Secondary | ICD-10-CM | POA: Diagnosis not present

## 2021-02-04 DIAGNOSIS — Z0100 Encounter for examination of eyes and vision without abnormal findings: Secondary | ICD-10-CM | POA: Diagnosis not present

## 2021-02-04 DIAGNOSIS — H43813 Vitreous degeneration, bilateral: Secondary | ICD-10-CM | POA: Diagnosis not present

## 2021-02-04 DIAGNOSIS — H5203 Hypermetropia, bilateral: Secondary | ICD-10-CM | POA: Diagnosis not present

## 2021-02-04 DIAGNOSIS — H2513 Age-related nuclear cataract, bilateral: Secondary | ICD-10-CM | POA: Diagnosis not present

## 2021-02-04 DIAGNOSIS — E113292 Type 2 diabetes mellitus with mild nonproliferative diabetic retinopathy without macular edema, left eye: Secondary | ICD-10-CM | POA: Diagnosis not present

## 2021-04-01 DIAGNOSIS — M79602 Pain in left arm: Secondary | ICD-10-CM | POA: Diagnosis not present

## 2021-04-01 DIAGNOSIS — I10 Essential (primary) hypertension: Secondary | ICD-10-CM | POA: Diagnosis not present

## 2021-04-01 DIAGNOSIS — M25562 Pain in left knee: Secondary | ICD-10-CM | POA: Diagnosis not present

## 2021-04-01 DIAGNOSIS — Z1389 Encounter for screening for other disorder: Secondary | ICD-10-CM | POA: Diagnosis not present

## 2021-04-01 DIAGNOSIS — E1169 Type 2 diabetes mellitus with other specified complication: Secondary | ICD-10-CM | POA: Diagnosis not present

## 2021-04-01 DIAGNOSIS — D509 Iron deficiency anemia, unspecified: Secondary | ICD-10-CM | POA: Diagnosis not present

## 2021-04-01 DIAGNOSIS — E785 Hyperlipidemia, unspecified: Secondary | ICD-10-CM | POA: Diagnosis not present

## 2021-04-01 DIAGNOSIS — E7800 Pure hypercholesterolemia, unspecified: Secondary | ICD-10-CM | POA: Diagnosis not present

## 2021-04-01 DIAGNOSIS — Z0000 Encounter for general adult medical examination without abnormal findings: Secondary | ICD-10-CM | POA: Diagnosis not present

## 2021-04-01 DIAGNOSIS — I77819 Aortic ectasia, unspecified site: Secondary | ICD-10-CM | POA: Diagnosis not present

## 2021-04-03 ENCOUNTER — Other Ambulatory Visit: Payer: Self-pay | Admitting: Family Medicine

## 2021-04-03 DIAGNOSIS — E2839 Other primary ovarian failure: Secondary | ICD-10-CM

## 2021-06-25 DIAGNOSIS — E7800 Pure hypercholesterolemia, unspecified: Secondary | ICD-10-CM | POA: Diagnosis not present

## 2021-06-25 DIAGNOSIS — I10 Essential (primary) hypertension: Secondary | ICD-10-CM | POA: Diagnosis not present

## 2021-06-25 DIAGNOSIS — E1169 Type 2 diabetes mellitus with other specified complication: Secondary | ICD-10-CM | POA: Diagnosis not present

## 2021-08-31 DIAGNOSIS — M25561 Pain in right knee: Secondary | ICD-10-CM | POA: Diagnosis not present

## 2021-08-31 DIAGNOSIS — I10 Essential (primary) hypertension: Secondary | ICD-10-CM | POA: Diagnosis not present

## 2021-09-24 ENCOUNTER — Ambulatory Visit
Admission: RE | Admit: 2021-09-24 | Discharge: 2021-09-24 | Disposition: A | Discharge status: Home / Self Care | Payer: Medicare HMO | Source: Ambulatory Visit | Attending: Family Medicine | Admitting: Family Medicine

## 2021-09-24 DIAGNOSIS — Z780 Asymptomatic menopausal state: Secondary | ICD-10-CM | POA: Diagnosis not present

## 2021-09-24 DIAGNOSIS — E2839 Other primary ovarian failure: Secondary | ICD-10-CM

## 2021-10-27 DIAGNOSIS — I77819 Aortic ectasia, unspecified site: Secondary | ICD-10-CM | POA: Diagnosis not present

## 2021-10-27 DIAGNOSIS — M25551 Pain in right hip: Secondary | ICD-10-CM | POA: Diagnosis not present

## 2021-10-27 DIAGNOSIS — I10 Essential (primary) hypertension: Secondary | ICD-10-CM | POA: Diagnosis not present

## 2021-10-27 DIAGNOSIS — E7800 Pure hypercholesterolemia, unspecified: Secondary | ICD-10-CM | POA: Diagnosis not present

## 2021-10-27 DIAGNOSIS — M25561 Pain in right knee: Secondary | ICD-10-CM | POA: Diagnosis not present

## 2021-10-27 DIAGNOSIS — M25562 Pain in left knee: Secondary | ICD-10-CM | POA: Diagnosis not present

## 2021-10-27 DIAGNOSIS — E1169 Type 2 diabetes mellitus with other specified complication: Secondary | ICD-10-CM | POA: Diagnosis not present

## 2021-10-27 DIAGNOSIS — M79602 Pain in left arm: Secondary | ICD-10-CM | POA: Diagnosis not present

## 2021-10-27 DIAGNOSIS — D509 Iron deficiency anemia, unspecified: Secondary | ICD-10-CM | POA: Diagnosis not present

## 2021-10-30 DIAGNOSIS — M25561 Pain in right knee: Secondary | ICD-10-CM | POA: Diagnosis not present

## 2021-10-30 DIAGNOSIS — T8484XA Pain due to internal orthopedic prosthetic devices, implants and grafts, initial encounter: Secondary | ICD-10-CM | POA: Diagnosis not present

## 2021-11-04 DIAGNOSIS — T84039D Mechanical loosening of unspecified internal prosthetic joint, subsequent encounter: Secondary | ICD-10-CM | POA: Diagnosis not present

## 2021-11-04 DIAGNOSIS — M25561 Pain in right knee: Secondary | ICD-10-CM | POA: Diagnosis not present

## 2021-12-01 DIAGNOSIS — E1169 Type 2 diabetes mellitus with other specified complication: Secondary | ICD-10-CM | POA: Diagnosis not present

## 2021-12-01 DIAGNOSIS — E7800 Pure hypercholesterolemia, unspecified: Secondary | ICD-10-CM | POA: Diagnosis not present

## 2021-12-01 DIAGNOSIS — I10 Essential (primary) hypertension: Secondary | ICD-10-CM | POA: Diagnosis not present

## 2021-12-01 DIAGNOSIS — D509 Iron deficiency anemia, unspecified: Secondary | ICD-10-CM | POA: Diagnosis not present

## 2021-12-02 NOTE — Progress Notes (Signed)
COVID Vaccine received:  _0  No _1  Yes Date of any COVID positive Test in last 90 days: None  PCP - Maury Dus, MD  Cardiologist -  none   Chest x-ray - 08-31-2018 EKG -  08-31-2018  will repeat at PST Stress Test -  ECHO -  Cardiac Cath -   PCR screen: _2  Ordered & Completed                      _3   No Order but Needs PROFEND                      _4   N/A for this surgery  Surgery Plan:  _5  Ambulatory                            _6  Outpatient in bed                            _7  Admit  Anesthesia:    _8  General  _9  Spinal                           _10   Choice _11   MAC   Pacemaker / ICD device _12  No _13  Yes        Device order form faxed _14  No    _15   Yes      Faxed to:  Spinal Cord Stimulator:_16  No _17  Yes      (Remind patient to bring remote DOS) Other Implants:   History of Sleep Apnea? _18  No _19  Yes   CPAP used?- _20  No _21  Yes    Does the patient monitor blood sugar? _22  No _23  Yes  _24  N/A Fasting Blood Sugar Ranges-  Checks Blood Sugar _1_ times a week   PRE-DM, patient on no medications. Diet only  Blood Thinner / Instructions:none Aspirin Instructions: none  ERAS Protocol Ordered: _25  No  _26  Yes PRE-SURGERY _27  ENSURE  _28  G2  Patient is to be NPO after: 05:30 am  Comments: Patient lives alone but has several family members that are going to check on her throughout each day postopl.  Activity level: Patient can not climb a flight of stairs without difficulty. Patient can perform ADLs without assistance.   Anesthesia review: Pre-DM (Diet only), HTN, Anemia  Patient denies shortness of breath, fever, cough and chest pain at PAT appointment.  Patient verbalized understanding and agreement to the Pre-Surgical Instructions that were given to them at this PAT appointment. Patient was also educated of the need to review these PAT instructions again prior to his/her surgery.I reviewed the appropriate phone numbers to call if they have any and questions or  concerns.

## 2021-12-02 NOTE — Patient Instructions (Signed)
SURGICAL WAITING ROOM VISITATION Patients having surgery or a procedure may have no more than 2 support people in the waiting area - these visitors may rotate in the visitor waiting room.   Children under the age of 35 must have an adult with them who is not the patient. If the patient needs to stay at the hospital during part of their recovery, the visitor guidelines for inpatient rooms apply.  PRE-OP VISITATION  Pre-op nurse will coordinate an appropriate time for 1 support person to accompany the patient in pre-op.  This support person may not rotate.  This visitor will be contacted when the time is appropriate for the visitor to come back in the pre-op area.  Please refer to the [facility] website for the visitor guidelines for Inpatients (after your surgery is over and you are in a regular room).  You are not required to quarantine at this time prior to your surgery. However, you must do this: Hand Hygiene often Do NOT share personal items Notify your provider if you are in close contact with someone who has COVID or you develop fever 100.4 or greater, new onset of sneezing, cough, sore throat, shortness of breath or body aches.  If you test positive for Covid or have been in contact with anyone that has tested positive in the last 10 days please notify you surgeon.    Your procedure is scheduled on:  Thursday  December 10, 2021  Report to [facility] Main Entrance: [place] entrance where the [employer] is available.   Report to admitting at: 06:00    AM  +++++Call this number if you have any questions or problems the morning of surgery [phone]  Do not eat food after Midnight the night prior to your surgery/procedure.  After Midnight you may have the following liquids until   05:30 AM  DAY OF SURGERY  Clear Liquid Diet Water Black Coffee (sugar ok, NO MILK/CREAM OR CREAMERS)  Tea (sugar ok, NO MILK/CREAM OR CREAMERS) regular and decaf                              Plain Jell-O  with no fruit (NO RED)                                           Fruit ices (not with fruit pulp, NO RED)                                     Popsicles (NO RED)                                                                  Juice: apple, WHITE grape, WHITE cranberry Sports drinks like Gatorade or Powerade (NO RED)                    The day of surgery:  Drink ONE (1) Pre-Surgery G2 at  05:30 AM the morning of surgery. Drink in one sitting. Do not sip.  This drink was  given to you during your hospital pre-op appointment visit. Nothing else to drink after completing the Pre-Surgery G2 : No candy, chewing gum or throat lozenges.    FOLLOW ANY ADDITIONAL PRE OP INSTRUCTIONS YOU RECEIVED FROM YOUR SURGEON'S OFFICE!!!   Oral Hygiene is also important to reduce your risk of infection.        Remember - BRUSH YOUR TEETH THE MORNING OF SURGERY WITH YOUR REGULAR TOOTHPASTE  Take ONLY these medicines the morning of surgery with A SIP OF WATER: Gabapentin, Metoprolol, and Amlodipine.  You may take Tramadol if needed for moderate pain.                    You may not have any metal on your body including hair pins, jewelry, and body piercing  Do not wear make-up, lotions, powders, perfumes or deodorant  Do not wear nail polish including gel and S&S, artificial / acrylic nails, or any other type of covering on natural nails including finger and toenails. If you have artificial nails, gel coating, etc., that needs to be removed by a nail salon, Please have this removed prior to surgery. Not doing so may mean that your surgery could be cancelled or delayed if the Surgeon or anesthesia staff feels like they are unable to monitor you safely.   Do not shave 48 hours prior to surgery to avoid nicks in your skin which may contribute to postoperative infections.   Contacts, Hearing Aids, dentures or bridgework may not be worn into surgery.   You may bring a small overnight bag with you  on the day of surgery, only pack items that are not valuable .[facility] IS NOT RESPONSIBLE   FOR VALUABLES THAT ARE LOST OR STOLEN.   Do not bring your home medications to the hospital. The Pharmacy will dispense medications listed on your medication list to you during your admission in the Hospital.  Special Instructions: Bring a copy of your healthcare power of attorney and living will documents the day of surgery, if you wish to have them scanned into your [facility] Records- EPIC  Please read over the following fact sheets you were given: IF YOU HAVE QUESTIONS ABOUT YOUR PRE-OP INSTRUCTIONS, PLEASE CALL [phone]  ([place])   [place] - Preparing for Surgery Before surgery, you can play an important role.  Because skin is not sterile, your skin needs to be as free of germs as possible.  You can reduce the number of germs on your skin by washing with CHG (chlorahexidine gluconate) soap before surgery.  CHG is an antiseptic cleaner which kills germs and bonds with the skin to continue killing germs even after washing. Please DO NOT use if you have an allergy to CHG or antibacterial soaps.  If your skin becomes reddened/irritated stop using the CHG and inform your nurse when you arrive at Short Stay. Do not shave (including legs and underarms) for at least 48 hours prior to the first CHG shower.  You may shave your face/neck.  Please follow these instructions carefully:  1.  Shower with CHG Soap the night before surgery and the  morning of surgery.  2.  If you choose to wash your hair, wash your hair first as usual with your normal  shampoo.  3.  After you shampoo, rinse your hair and body thoroughly to remove the shampoo.  4.  Use CHG as you would any other liquid soap.  You can apply chg directly to the skin and wash.  Gently with a scrungie or clean washcloth.  5.  Apply the CHG Soap to your body ONLY FROM THE NECK DOWN.   Do not use on face/ open                            Wound or open sores. Avoid contact with eyes, ears mouth and genitals (private parts).                       Wash face,  Genitals (private parts) with your normal soap.             6.  Wash thoroughly, paying special attention to the area where your  surgery  will be performed.  7.  Thoroughly rinse your body with warm water from the neck down.  8.  DO NOT shower/wash with your normal soap after using and rinsing off the CHG Soap.            9.  Pat yourself dry with a clean towel.            10.  Wear clean pajamas.            11.  Place clean sheets on your bed the night of your first shower and do not  sleep with pets.  ON THE DAY OF SURGERY : Do not apply any lotions/deodorants the morning of surgery.  Please wear clean clothes to the hospital/surgery center.    FAILURE TO FOLLOW THESE INSTRUCTIONS MAY RESULT IN THE CANCELLATION OF YOUR SURGERY  PATIENT SIGNATURE_________________________________  NURSE SIGNATURE__________________________________  ________________________________________________________________________       Adam Phenix    An incentive spirometer is a tool that can help keep your lungs clear and active. This tool measures how well you are filling your lungs with each breath. Taking long deep breaths may help reverse or decrease the chance of developing breathing (pulmonary) problems (especially infection) following: A long period of time when you are unable to move or be active. BEFORE THE PROCEDURE  If the spirometer includes an indicator to show your best effort, your nurse or respiratory therapist will set it to a desired goal. If possible, sit up straight or lean slightly forward. Try not to slouch. Hold the incentive spirometer in an upright position. INSTRUCTIONS FOR USE  Sit on the edge of your bed if possible, or sit up as far as you can in bed or on a chair. Hold the incentive spirometer in an upright position. Breathe  out normally. Place the mouthpiece in your mouth and seal your lips tightly around it. Breathe in slowly and as deeply as possible, raising the piston or the ball toward the top of the column. Hold your breath for 3-5 seconds or for as long as possible. Allow the piston or ball to fall to the bottom of the column. Remove the mouthpiece from your mouth and breathe out normally. Rest for a few seconds and repeat Steps 1 through 7 at least 10 times every 1-2 hours when you are awake. Take your time and take a few normal breaths between deep breaths. The spirometer may include an indicator to show your best effort. Use the indicator as a goal to work toward during each repetition. After each set of 10 deep breaths, practice coughing to be  sure your lungs are clear. If you have an incision (the cut made at the time of surgery), support your incision when coughing by placing a pillow or rolled up towels firmly against it. Once you are able to get out of bed, walk around indoors and cough well. You may stop using the incentive spirometer when instructed by your caregiver.  RISKS AND COMPLICATIONS Take your time so you do not get dizzy or light-headed. If you are in pain, you may need to take or ask for pain medication before doing incentive spirometry. It is harder to take a deep breath if you are having pain. AFTER USE Rest and breathe slowly and easily. It can be helpful to keep track of a log of your progress. Your caregiver can provide you with a simple table to help with this. If you are using the spirometer at home, follow these instructions: [place] IF:  You are having difficultly using the spirometer. You have trouble using the spirometer as often as instructed. Your pain medication is not giving enough relief while using the spirometer. You develop fever of 100.5 F (38.1 C) or higher.                                                                                                     SEEK IMMEDIATE MEDICAL CARE IF:  You cough up bloody sputum that had not been present before. You develop fever of 102 F (38.9 C) or greater. You develop worsening pain at or near the incision site. MAKE SURE YOU:  Understand these instructions. Will watch your condition. Will get help right away if you are not doing well or get worse. Document Released: 05/03/2006 Document Revised: 03/15/2011 Document Reviewed: 07/04/2006 [facility] Patient Information 2014 [place], [place].   WHAT IS A BLOOD TRANSFUSION? Blood Transfusion Information  A transfusion is the replacement of blood or some of its parts. Blood is made up of multiple cells which provide different functions. Red blood cells carry oxygen and are used for blood loss replacement. White blood cells fight against infection. Platelets control bleeding. Plasma helps clot blood. Other blood products are available for specialized needs, such as hemophilia or other clotting disorders. BEFORE THE TRANSFUSION  Who gives blood for transfusions?  Healthy volunteers who are fully evaluated to make sure their blood is safe. This is blood bank blood. Transfusion therapy is the safest it has ever been in the practice of medicine. Before blood is taken from a donor, a complete history is taken to make sure that person has no history of diseases nor engages in risky social behavior (examples are intravenous drug use or sexual activity with multiple partners). The donor's travel history is screened to minimize risk of transmitting infections, such as malaria. The donated blood is tested for signs of infectious diseases, such as HIV and hepatitis. The blood is then tested to be sure it is compatible with you in order to minimize the chance of a transfusion reaction. If you or a relative donates blood, this is often done in anticipation of surgery and is not  appropriate for emergency situations. It takes many days to process the donated blood. RISKS AND  COMPLICATIONS Although transfusion therapy is very safe and saves many lives, the main dangers of transfusion include:  Getting an infectious disease. Developing a transfusion reaction. This is an allergic reaction to something in the blood you were given. Every precaution is taken to prevent this. The decision to have a blood transfusion has been considered carefully by your caregiver before blood is given. Blood is not given unless the benefits outweigh the risks. AFTER THE TRANSFUSION Right after receiving a blood transfusion, you will usually feel much better and more energetic. This is especially true if your red blood cells have gotten low (anemic). The transfusion raises the level of the red blood cells which carry oxygen, and this usually causes an energy increase. The nurse administering the transfusion will monitor you carefully for complications. HOME CARE INSTRUCTIONS  No special instructions are needed after a transfusion. You may find your energy is better. Speak with your caregiver about any limitations on activity for underlying diseases you may have. SEEK MEDICAL CARE IF:  Your condition is not improving after your transfusion. You develop redness or irritation at the intravenous (IV) site. SEEK IMMEDIATE MEDICAL CARE IF:  Any of the following symptoms occur over the next 12 hours: Shaking chills. You have a temperature by mouth above 102 F (38.9 C), not controlled by medicine. Chest, back, or muscle pain. People around you feel you are not acting correctly or are confused. Shortness of breath or difficulty breathing. Dizziness and fainting. You get a rash or develop hives. You have a decrease in urine output. Your urine turns a dark color or changes to pink, red, or brown. Any of the following symptoms occur over the next 10 days: You have a temperature by mouth above 102 F (38.9 C), not controlled by medicine. Shortness of breath. Weakness after normal activity. The  white part of the eye turns yellow (jaundice). You have a decrease in the amount of urine or are urinating less often. Your urine turns a dark color or changes to pink, red, or brown. Document Released: 12/19/1999 Document Revised: 03/15/2011 Document Reviewed: 08/07/2007 [facility] Patient Information 2014 [place], [place].  _______________________________________________________________________

## 2021-12-03 ENCOUNTER — Encounter (HOSPITAL_COMMUNITY)
Admission: RE | Admit: 2021-12-03 | Discharge: 2021-12-03 | Disposition: A | Discharge status: Home / Self Care | Payer: Medicare HMO | Source: Ambulatory Visit | Attending: Orthopedic Surgery | Admitting: Orthopedic Surgery

## 2021-12-03 ENCOUNTER — Other Ambulatory Visit: Payer: Self-pay

## 2021-12-03 ENCOUNTER — Encounter (HOSPITAL_COMMUNITY): Payer: Self-pay

## 2021-12-03 VITALS — BP 146/58 | HR 54 | Temp 98.3°F | Resp 14 | Ht 65.0 in | Wt 200.0 lb

## 2021-12-03 DIAGNOSIS — R7303 Prediabetes: Secondary | ICD-10-CM | POA: Insufficient documentation

## 2021-12-03 DIAGNOSIS — I10 Essential (primary) hypertension: Secondary | ICD-10-CM | POA: Insufficient documentation

## 2021-12-03 DIAGNOSIS — Z01818 Encounter for other preprocedural examination: Secondary | ICD-10-CM | POA: Insufficient documentation

## 2021-12-03 HISTORY — DX: Pneumonia, unspecified organism: J18.9

## 2021-12-03 HISTORY — DX: Prediabetes: R73.03

## 2021-12-03 LAB — CBC
HCT: 38.2 % (ref 36.0–46.0)
Hemoglobin: 11.9 g/dL — ABNORMAL LOW (ref 12.0–15.0)
MCH: 25.3 pg — ABNORMAL LOW (ref 26.0–34.0)
MCHC: 31.2 g/dL (ref 30.0–36.0)
MCV: 81.1 fL (ref 80.0–100.0)
Platelets: 182 10*3/uL (ref 150–400)
RBC: 4.71 MIL/uL (ref 3.87–5.11)
RDW: 16.3 % — ABNORMAL HIGH (ref 11.5–15.5)
WBC: 5.2 10*3/uL (ref 4.0–10.5)
nRBC: 0 % (ref 0.0–0.2)

## 2021-12-03 LAB — HEMOGLOBIN A1C
Hgb A1c MFr Bld: 6.1 % — ABNORMAL HIGH (ref 4.8–5.6)
Mean Plasma Glucose: 128 mg/dL

## 2021-12-03 LAB — BASIC METABOLIC PANEL
Anion gap: 7 (ref 5–15)
BUN: 19 mg/dL (ref 8–23)
CO2: 28 mmol/L (ref 22–32)
Calcium: 9.6 mg/dL (ref 8.9–10.3)
Chloride: 107 mmol/L (ref 98–111)
Creatinine, Ser: 0.69 mg/dL (ref 0.44–1.00)
GFR, Estimated: >60 mL/min (ref >60)
Glucose, Bld: 115 mg/dL — ABNORMAL HIGH (ref 70–99)
Potassium: 4.2 mmol/L (ref 3.5–5.1)
Sodium: 142 mmol/L (ref 135–145)

## 2021-12-03 LAB — SURGICAL PCR SCREEN
MRSA, PCR: NEGATIVE
Staphylococcus aureus: POSITIVE — AB

## 2021-12-03 LAB — GLUCOSE, CAPILLARY: Glucose-Capillary: 121 mg/dL — ABNORMAL HIGH (ref 70–99)

## 2021-12-03 NOTE — Progress Notes (Signed)
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 Appropriate notes have been placed on the patient's chart. This note has been routed to Dr.  Alvan Dame for review. The Patient's surgery is currently scheduled for: 12-10-2021 at [facility].  Leota Jacobsen, BSN, CVRN-BC   Pre-Surgical Testing Nurse [place]  [phone]

## 2021-12-09 NOTE — Anesthesia Preprocedure Evaluation (Signed)
Anesthesia Evaluation  Patient identified by MRN, date of birth, ID band Patient awake    Reviewed: Allergy & Precautions, NPO status , Patient's Chart, lab work & pertinent test results  Airway Mallampati: III  TM Distance: >3 FB Neck ROM: Full    Dental  (+)    Pulmonary neg pulmonary ROS   Pulmonary exam normal breath sounds clear to auscultation       Cardiovascular hypertension (amlodipine, irbesartan, metoprolol), Pt. on medications and Pt. on home beta blockers (-) angina (-) Past MI, (-) Cardiac Stents and (-) CABG + dysrhythmias (RBBB)  Rhythm:Regular Rate:Normal  HLD   Neuro/Psych negative neurological ROS     GI/Hepatic negative GI ROS, Neg liver ROS,,,  Endo/Other  diabetes (not on medications), Type 2    Renal/GU negative Renal ROS     Musculoskeletal  (+) Arthritis ,    Abdominal  (+) + obese  Peds  Hematology  (+) Blood dyscrasia (iron deficiency), anemia   Anesthesia Other Findings   Reproductive/Obstetrics                             Anesthesia Physical Anesthesia Plan  ASA: 2  Anesthesia Plan: Spinal and MAC   Post-op Pain Management: Regional block* and Tylenol PO (pre-op)*   Induction:   PONV Risk Score and Plan: 2 and Ondansetron, Propofol infusion and Dexamethasone  Airway Management Planned:   Additional Equipment:   Intra-op Plan:   Post-operative Plan:   Informed Consent: I have reviewed the patients History and Physical, chart, labs and discussed the procedure including the risks, benefits and alternatives for the proposed anesthesia with the patient or authorized representative who has indicated his/her understanding and acceptance.       Plan Discussed with: CRNA and Anesthesiologist  Anesthesia Plan Comments: (I have discussed risks of neuraxial anesthesia including but not limited to infection, bleeding, nerve injury, back pain, headache,  seizures, and failure of block. Patient denies bleeding disorders and is not currently anticoagulated. Labs have been reviewed. Risks and benefits discussed. All patient's questions answered.   Discussed potential risks of nerve blocks including, but not limited to, infection, bleeding, nerve damage, seizures, pneumothorax, respiratory depression, and potential failure of the block. Alternatives to nerve blocks discussed. All questions answered.  Discussed with patient risks of MAC including, but not limited to, minor pain or discomfort, hearing people in the room, and possible need for backup general anesthesia. Risks for general anesthesia also discussed including, but not limited to, sore throat, hoarse voice, chipped/damaged teeth, injury to vocal cords, nausea and vomiting, allergic reactions, lung infection, heart attack, stroke, and death. All questions answered. )        Anesthesia Quick Evaluation

## 2021-12-10 ENCOUNTER — Inpatient Hospital Stay (HOSPITAL_COMMUNITY): Payer: Medicare HMO | Admitting: Certified Registered"

## 2021-12-10 ENCOUNTER — Other Ambulatory Visit: Payer: Self-pay

## 2021-12-10 ENCOUNTER — Inpatient Hospital Stay (HOSPITAL_COMMUNITY)
Admission: RE | Admit: 2021-12-10 | Discharge: 2021-12-14 | DRG: 468 | Disposition: A | Discharge status: Home Health | Payer: Medicare HMO | Attending: Orthopedic Surgery | Admitting: Orthopedic Surgery

## 2021-12-10 ENCOUNTER — Encounter (HOSPITAL_COMMUNITY): Payer: Self-pay | Admitting: Orthopedic Surgery

## 2021-12-10 ENCOUNTER — Encounter (HOSPITAL_COMMUNITY)
Admission: RE | Disposition: A | Discharge status: Home Health | Payer: Self-pay | Source: Home / Self Care | Attending: Orthopedic Surgery

## 2021-12-10 ENCOUNTER — Inpatient Hospital Stay (HOSPITAL_COMMUNITY): Payer: Medicare HMO | Admitting: Physician Assistant

## 2021-12-10 ENCOUNTER — Inpatient Hospital Stay: Payer: Self-pay

## 2021-12-10 DIAGNOSIS — Y831 Surgical operation with implant of artificial internal device as the cause of abnormal reaction of the patient, or of later complication, without mention of misadventure at the time of the procedure: Secondary | ICD-10-CM | POA: Diagnosis present

## 2021-12-10 DIAGNOSIS — T84032A Mechanical loosening of internal right knee prosthetic joint, initial encounter: Secondary | ICD-10-CM | POA: Diagnosis present

## 2021-12-10 DIAGNOSIS — E785 Hyperlipidemia, unspecified: Secondary | ICD-10-CM | POA: Diagnosis present

## 2021-12-10 DIAGNOSIS — T84032D Mechanical loosening of internal right knee prosthetic joint, subsequent encounter: Secondary | ICD-10-CM | POA: Diagnosis not present

## 2021-12-10 DIAGNOSIS — I10 Essential (primary) hypertension: Secondary | ICD-10-CM | POA: Diagnosis present

## 2021-12-10 DIAGNOSIS — Z885 Allergy status to narcotic agent status: Secondary | ICD-10-CM | POA: Diagnosis not present

## 2021-12-10 DIAGNOSIS — R110 Nausea: Secondary | ICD-10-CM | POA: Diagnosis not present

## 2021-12-10 DIAGNOSIS — Z823 Family history of stroke: Secondary | ICD-10-CM | POA: Diagnosis not present

## 2021-12-10 DIAGNOSIS — Z886 Allergy status to analgesic agent status: Secondary | ICD-10-CM

## 2021-12-10 DIAGNOSIS — Z888 Allergy status to other drugs, medicaments and biological substances status: Secondary | ICD-10-CM

## 2021-12-10 DIAGNOSIS — M86461 Chronic osteomyelitis with draining sinus, right tibia and fibula: Secondary | ICD-10-CM | POA: Diagnosis not present

## 2021-12-10 DIAGNOSIS — Z9181 History of falling: Secondary | ICD-10-CM | POA: Diagnosis not present

## 2021-12-10 DIAGNOSIS — M109 Gout, unspecified: Secondary | ICD-10-CM | POA: Diagnosis not present

## 2021-12-10 DIAGNOSIS — T8453XD Infection and inflammatory reaction due to internal right knee prosthesis, subsequent encounter: Secondary | ICD-10-CM | POA: Diagnosis not present

## 2021-12-10 DIAGNOSIS — T8453XA Infection and inflammatory reaction due to internal right knee prosthesis, initial encounter: Principal | ICD-10-CM

## 2021-12-10 DIAGNOSIS — R7303 Prediabetes: Secondary | ICD-10-CM

## 2021-12-10 DIAGNOSIS — Z8249 Family history of ischemic heart disease and other diseases of the circulatory system: Secondary | ICD-10-CM | POA: Diagnosis not present

## 2021-12-10 DIAGNOSIS — Z96651 Presence of right artificial knee joint: Secondary | ICD-10-CM | POA: Diagnosis not present

## 2021-12-10 DIAGNOSIS — G8918 Other acute postprocedural pain: Secondary | ICD-10-CM | POA: Diagnosis not present

## 2021-12-10 DIAGNOSIS — M65861 Other synovitis and tenosynovitis, right lower leg: Secondary | ICD-10-CM | POA: Diagnosis not present

## 2021-12-10 DIAGNOSIS — T8450XA Infection and inflammatory reaction due to unspecified internal joint prosthesis, initial encounter: Secondary | ICD-10-CM

## 2021-12-10 HISTORY — PX: TOTAL KNEE REVISION: SHX996

## 2021-12-10 LAB — TYPE AND SCREEN
ABO/RH(D): AB POS
Antibody Screen: NEGATIVE

## 2021-12-10 LAB — GLUCOSE, CAPILLARY
Glucose-Capillary: 212 mg/dL — ABNORMAL HIGH (ref 70–99)
Glucose-Capillary: 181 mg/dL — ABNORMAL HIGH (ref 70–99)

## 2021-12-10 SURGERY — TOTAL KNEE REVISION
Anesthesia: Monitor Anesthesia Care | Site: Knee | Laterality: Right

## 2021-12-10 MED ORDER — VANCOMYCIN HCL 1000 MG IV SOLR
INTRAVENOUS | Status: AC
  Filled 2021-12-10: qty 100

## 2021-12-10 MED ORDER — METHOCARBAMOL 500 MG IVPB - SIMPLE MED
500.0000 mg | Freq: Four times a day (QID) | INTRAVENOUS | Status: DC | PRN
  Administered 2021-12-10: 500 mg via INTRAVENOUS

## 2021-12-10 MED ORDER — ACETAMINOPHEN 325 MG PO TABS: 325.0000 mg | ORAL_TABLET | Freq: Four times a day (QID) | ORAL | Status: DC | PRN

## 2021-12-10 MED ORDER — ASPIRIN 81 MG PO CHEW
81.0000 mg | CHEWABLE_TABLET | Freq: Two times a day (BID) | ORAL | Status: DC
  Administered 2021-12-10 – 2021-12-14 (×8): 81 mg via ORAL
  Filled 2021-12-10 (×8): qty 1

## 2021-12-10 MED ORDER — HYDROMORPHONE HCL 1 MG/ML IJ SOLN
INTRAMUSCULAR | Status: AC
  Filled 2021-12-10: qty 1

## 2021-12-10 MED ORDER — FENTANYL CITRATE PF 50 MCG/ML IJ SOSY
50.0000 ug | PREFILLED_SYRINGE | INTRAMUSCULAR | Status: DC
  Administered 2021-12-10: 50 ug via INTRAVENOUS
  Filled 2021-12-10: qty 2

## 2021-12-10 MED ORDER — POVIDONE-IODINE 10 % EX SWAB
2.0000 | Freq: Once | CUTANEOUS | Status: AC
  Administered 2021-12-10: 2 via TOPICAL

## 2021-12-10 MED ORDER — DOCUSATE SODIUM 100 MG PO CAPS
100.0000 mg | ORAL_CAPSULE | Freq: Two times a day (BID) | ORAL | Status: DC
  Administered 2021-12-10 – 2021-12-14 (×8): 100 mg via ORAL
  Filled 2021-12-10 (×9): qty 1

## 2021-12-10 MED ORDER — ONDANSETRON HCL 4 MG/2ML IJ SOLN
INTRAMUSCULAR | Status: AC
  Filled 2021-12-10: qty 2

## 2021-12-10 MED ORDER — KETOROLAC TROMETHAMINE 30 MG/ML IJ SOLN
INTRAMUSCULAR | Status: AC
  Filled 2021-12-10: qty 1

## 2021-12-10 MED ORDER — SODIUM CHLORIDE 0.9 % IV SOLN
8.0000 mg/kg | Freq: Every day | INTRAVENOUS | Status: DC
  Administered 2021-12-10 – 2021-12-13 (×4): 700 mg via INTRAVENOUS
  Filled 2021-12-10 (×5): qty 14

## 2021-12-10 MED ORDER — SODIUM CHLORIDE 0.9 % IV SOLN: INTRAVENOUS | Status: DC

## 2021-12-10 MED ORDER — VANCOMYCIN HCL 1000 MG IV SOLR
INTRAVENOUS | Status: AC
  Filled 2021-12-10: qty 120

## 2021-12-10 MED ORDER — GABAPENTIN 300 MG PO CAPS
300.0000 mg | ORAL_CAPSULE | Freq: Three times a day (TID) | ORAL | Status: DC
  Administered 2021-12-10 – 2021-12-14 (×11): 300 mg via ORAL
  Filled 2021-12-10 (×12): qty 1

## 2021-12-10 MED ORDER — METHOCARBAMOL 500 MG IVPB - SIMPLE MED
INTRAVENOUS | Status: AC
  Filled 2021-12-10: qty 55

## 2021-12-10 MED ORDER — ROCURONIUM BROMIDE 100 MG/10ML IV SOLN
INTRAVENOUS | Status: DC | PRN
  Administered 2021-12-10: 20 mg via INTRAVENOUS
  Administered 2021-12-10: 40 mg via INTRAVENOUS
  Administered 2021-12-10: 10 mg via INTRAVENOUS

## 2021-12-10 MED ORDER — LACTATED RINGERS IV SOLN: INTRAVENOUS | Status: DC

## 2021-12-10 MED ORDER — DIPHENHYDRAMINE HCL 12.5 MG/5ML PO ELIX: 12.5000 mg | ORAL_SOLUTION | ORAL | Status: DC | PRN

## 2021-12-10 MED ORDER — ROPIVACAINE HCL 5 MG/ML IJ SOLN
INTRAMUSCULAR | Status: DC | PRN
  Administered 2021-12-10: 20 mL via PERINEURAL

## 2021-12-10 MED ORDER — OXYCODONE HCL 5 MG PO TABS: 5.0000 mg | ORAL_TABLET | Freq: Once | ORAL | Status: DC | PRN

## 2021-12-10 MED ORDER — BUPIVACAINE-EPINEPHRINE (PF) 0.5% -1:200000 IJ SOLN
INTRAMUSCULAR | Status: AC
  Filled 2021-12-10: qty 30

## 2021-12-10 MED ORDER — KETOROLAC TROMETHAMINE 30 MG/ML IJ SOLN
INTRAMUSCULAR | Status: DC | PRN
  Administered 2021-12-10: 30 mg via INTRAMUSCULAR

## 2021-12-10 MED ORDER — VANCOMYCIN HCL 1000 MG IV SOLR
INTRAVENOUS | Status: AC
  Filled 2021-12-10: qty 80

## 2021-12-10 MED ORDER — HYDROMORPHONE HCL 1 MG/ML IJ SOLN
0.2500 mg | INTRAMUSCULAR | Status: DC | PRN
  Administered 2021-12-10 (×4): 0.5 mg via INTRAVENOUS

## 2021-12-10 MED ORDER — CHLORHEXIDINE GLUCONATE 0.12 % MT SOLN
15.0000 mL | Freq: Once | OROMUCOSAL | Status: AC
  Administered 2021-12-10: 15 mL via OROMUCOSAL

## 2021-12-10 MED ORDER — DEXAMETHASONE SODIUM PHOSPHATE 10 MG/ML IJ SOLN
INTRAMUSCULAR | Status: AC
  Filled 2021-12-10: qty 1

## 2021-12-10 MED ORDER — ROSUVASTATIN CALCIUM 5 MG PO TABS
5.0000 mg | ORAL_TABLET | ORAL | Status: DC
  Administered 2021-12-13: 5 mg via ORAL
  Filled 2021-12-10 (×2): qty 1

## 2021-12-10 MED ORDER — ACETAMINOPHEN 500 MG PO TABS
1000.0000 mg | ORAL_TABLET | Freq: Once | ORAL | Status: AC
  Administered 2021-12-10: 1000 mg via ORAL
  Filled 2021-12-10: qty 2

## 2021-12-10 MED ORDER — ONDANSETRON HCL 4 MG PO TABS
4.0000 mg | ORAL_TABLET | Freq: Four times a day (QID) | ORAL | Status: DC | PRN
  Filled 2021-12-10: qty 1

## 2021-12-10 MED ORDER — 0.9 % SODIUM CHLORIDE (POUR BTL) OPTIME
TOPICAL | Status: DC | PRN
  Administered 2021-12-10: 1000 mL

## 2021-12-10 MED ORDER — DEXAMETHASONE SODIUM PHOSPHATE 10 MG/ML IJ SOLN
8.0000 mg | Freq: Once | INTRAMUSCULAR | Status: AC
  Administered 2021-12-10: 4 mg via INTRAVENOUS

## 2021-12-10 MED ORDER — ORAL CARE MOUTH RINSE: 15.0000 mL | Freq: Once | OROMUCOSAL | Status: AC

## 2021-12-10 MED ORDER — SUGAMMADEX SODIUM 200 MG/2ML IV SOLN
INTRAVENOUS | Status: DC | PRN
  Administered 2021-12-10: 200 mg via INTRAVENOUS

## 2021-12-10 MED ORDER — LIDOCAINE HCL (PF) 2 % IJ SOLN
INTRAMUSCULAR | Status: AC
  Filled 2021-12-10: qty 5

## 2021-12-10 MED ORDER — METOCLOPRAMIDE HCL 5 MG/ML IJ SOLN
5.0000 mg | Freq: Three times a day (TID) | INTRAMUSCULAR | Status: DC | PRN
  Administered 2021-12-10: 10 mg via INTRAVENOUS
  Filled 2021-12-10: qty 2

## 2021-12-10 MED ORDER — FENTANYL CITRATE (PF) 100 MCG/2ML IJ SOLN
INTRAMUSCULAR | Status: DC | PRN
  Administered 2021-12-10 (×4): 50 ug via INTRAVENOUS

## 2021-12-10 MED ORDER — HYDROCODONE-ACETAMINOPHEN 7.5-325 MG PO TABS
1.0000 | ORAL_TABLET | ORAL | Status: DC | PRN
  Administered 2021-12-10 – 2021-12-11 (×2): 2 via ORAL
  Administered 2021-12-11 – 2021-12-13 (×2): 1 via ORAL
  Filled 2021-12-10: qty 2
  Filled 2021-12-10: qty 1
  Filled 2021-12-10: qty 2
  Filled 2021-12-10: qty 1

## 2021-12-10 MED ORDER — PROMETHAZINE HCL 25 MG/ML IJ SOLN: 6.2500 mg | INTRAMUSCULAR | Status: DC | PRN

## 2021-12-10 MED ORDER — DEXAMETHASONE SODIUM PHOSPHATE 10 MG/ML IJ SOLN
10.0000 mg | Freq: Once | INTRAMUSCULAR | Status: AC
  Administered 2021-12-11: 10 mg via INTRAVENOUS
  Filled 2021-12-10: qty 1

## 2021-12-10 MED ORDER — SODIUM CHLORIDE 0.9 % IV SOLN
2.0000 g | INTRAVENOUS | Status: DC
  Administered 2021-12-10 – 2021-12-13 (×4): 2 g via INTRAVENOUS
  Filled 2021-12-10 (×4): qty 20

## 2021-12-10 MED ORDER — SODIUM CHLORIDE 0.9 % IV SOLN
6.2500 mg | Freq: Four times a day (QID) | INTRAVENOUS | Status: DC | PRN
  Administered 2021-12-10: 6.25 mg via INTRAVENOUS
  Filled 2021-12-10 (×5): qty 0.25

## 2021-12-10 MED ORDER — CEFAZOLIN SODIUM-DEXTROSE 2-4 GM/100ML-% IV SOLN: 2.0000 g | Freq: Four times a day (QID) | INTRAVENOUS | Status: DC

## 2021-12-10 MED ORDER — EPHEDRINE SULFATE-NACL 50-0.9 MG/10ML-% IV SOSY
PREFILLED_SYRINGE | INTRAVENOUS | Status: DC | PRN
  Administered 2021-12-10: 5 mg via INTRAVENOUS

## 2021-12-10 MED ORDER — TRANEXAMIC ACID-NACL 1000-0.7 MG/100ML-% IV SOLN
1000.0000 mg | INTRAVENOUS | Status: AC
  Administered 2021-12-10: 1000 mg via INTRAVENOUS
  Filled 2021-12-10: qty 100

## 2021-12-10 MED ORDER — PROPOFOL 1000 MG/100ML IV EMUL
INTRAVENOUS | Status: AC
  Filled 2021-12-10: qty 100

## 2021-12-10 MED ORDER — INSULIN ASPART 100 UNIT/ML IJ SOLN
0.0000 [IU] | Freq: Three times a day (TID) | INTRAMUSCULAR | Status: DC
  Administered 2021-12-10 – 2021-12-11 (×2): 3 [IU] via SUBCUTANEOUS
  Administered 2021-12-11 – 2021-12-12 (×3): 2 [IU] via SUBCUTANEOUS
  Administered 2021-12-13: 3 [IU] via SUBCUTANEOUS
  Administered 2021-12-13 (×2): 2 [IU] via SUBCUTANEOUS
  Administered 2021-12-14: 3 [IU] via SUBCUTANEOUS
  Administered 2021-12-14: 2 [IU] via SUBCUTANEOUS

## 2021-12-10 MED ORDER — AMLODIPINE BESYLATE 5 MG PO TABS
5.0000 mg | ORAL_TABLET | Freq: Two times a day (BID) | ORAL | Status: DC
  Administered 2021-12-10 – 2021-12-14 (×8): 5 mg via ORAL
  Filled 2021-12-10 (×8): qty 1

## 2021-12-10 MED ORDER — TRANEXAMIC ACID-NACL 1000-0.7 MG/100ML-% IV SOLN
1000.0000 mg | Freq: Once | INTRAVENOUS | Status: AC
  Administered 2021-12-10: 1000 mg via INTRAVENOUS
  Filled 2021-12-10: qty 100

## 2021-12-10 MED ORDER — MENTHOL 3 MG MT LOZG: 1.0000 | LOZENGE | OROMUCOSAL | Status: DC | PRN

## 2021-12-10 MED ORDER — SODIUM CHLORIDE 0.9 % IR SOLN
Status: DC | PRN
  Administered 2021-12-10: 3000 mL
  Administered 2021-12-10: 1000 mL
  Administered 2021-12-10: 3000 mL

## 2021-12-10 MED ORDER — SODIUM CHLORIDE (PF) 0.9 % IJ SOLN
INTRAMUSCULAR | Status: DC | PRN
  Administered 2021-12-10: 30 mL

## 2021-12-10 MED ORDER — TOBRAMYCIN SULFATE 1.2 G IJ SOLR
6.0000 g | INTRAMUSCULAR | Status: AC
  Filled 2021-12-10: qty 6

## 2021-12-10 MED ORDER — CEFAZOLIN SODIUM-DEXTROSE 2-4 GM/100ML-% IV SOLN
2.0000 g | INTRAVENOUS | Status: AC
  Administered 2021-12-10: 2 g via INTRAVENOUS
  Filled 2021-12-10: qty 100

## 2021-12-10 MED ORDER — ONDANSETRON HCL 4 MG/2ML IJ SOLN
INTRAMUSCULAR | Status: DC | PRN
  Administered 2021-12-10: 4 mg via INTRAVENOUS

## 2021-12-10 MED ORDER — MIDAZOLAM HCL 2 MG/2ML IJ SOLN
1.0000 mg | INTRAMUSCULAR | Status: DC
  Filled 2021-12-10: qty 2

## 2021-12-10 MED ORDER — HYDROCODONE-ACETAMINOPHEN 5-325 MG PO TABS
1.0000 | ORAL_TABLET | ORAL | Status: DC | PRN
  Administered 2021-12-12: 2 via ORAL
  Administered 2021-12-12: 1 via ORAL
  Filled 2021-12-10: qty 1
  Filled 2021-12-10: qty 2

## 2021-12-10 MED ORDER — OXYCODONE HCL 5 MG/5ML PO SOLN: 5.0000 mg | Freq: Once | ORAL | Status: DC | PRN

## 2021-12-10 MED ORDER — ONDANSETRON HCL 4 MG/2ML IJ SOLN: 4.0000 mg | Freq: Four times a day (QID) | INTRAMUSCULAR | Status: DC | PRN

## 2021-12-10 MED ORDER — METOCLOPRAMIDE HCL 5 MG PO TABS
5.0000 mg | ORAL_TABLET | Freq: Three times a day (TID) | ORAL | Status: DC | PRN
  Filled 2021-12-10: qty 2

## 2021-12-10 MED ORDER — FENTANYL CITRATE (PF) 100 MCG/2ML IJ SOLN
INTRAMUSCULAR | Status: AC
  Filled 2021-12-10: qty 2

## 2021-12-10 MED ORDER — POLYETHYLENE GLYCOL 3350 17 G PO PACK
17.0000 g | PACK | Freq: Two times a day (BID) | ORAL | Status: DC
  Administered 2021-12-10 – 2021-12-12 (×2): 17 g via ORAL
  Filled 2021-12-10 (×7): qty 1

## 2021-12-10 MED ORDER — METHOCARBAMOL 500 MG PO TABS
500.0000 mg | ORAL_TABLET | Freq: Four times a day (QID) | ORAL | Status: DC | PRN
  Administered 2021-12-10 – 2021-12-11 (×2): 500 mg via ORAL
  Filled 2021-12-10 (×2): qty 1

## 2021-12-10 MED ORDER — PHENOL 1.4 % MT LIQD: 1.0000 | OROMUCOSAL | Status: DC | PRN

## 2021-12-10 MED ORDER — PROPOFOL 10 MG/ML IV BOLUS
INTRAVENOUS | Status: AC
  Filled 2021-12-10: qty 20

## 2021-12-10 MED ORDER — IRBESARTAN 300 MG PO TABS
300.0000 mg | ORAL_TABLET | Freq: Every day | ORAL | Status: DC
  Administered 2021-12-11 – 2021-12-14 (×4): 300 mg via ORAL
  Filled 2021-12-10 (×4): qty 1

## 2021-12-10 MED ORDER — LIDOCAINE 2% (20 MG/ML) 5 ML SYRINGE
INTRAMUSCULAR | Status: DC | PRN
  Administered 2021-12-10: 100 mg via INTRAVENOUS

## 2021-12-10 MED ORDER — VANCOMYCIN HCL 1000 MG IV SOLR
INTRAVENOUS | Status: DC | PRN
  Administered 2021-12-10: 9 g

## 2021-12-10 MED ORDER — BUPIVACAINE-EPINEPHRINE 0.25% -1:200000 IJ SOLN
INTRAMUSCULAR | Status: DC | PRN
  Administered 2021-12-10: 30 mL

## 2021-12-10 MED ORDER — TOBRAMYCIN SULFATE 1.2 G IJ SOLR
INTRAMUSCULAR | Status: DC | PRN
  Administered 2021-12-10: 10.8 g

## 2021-12-10 MED ORDER — HYDROMORPHONE HCL 1 MG/ML IJ SOLN: 0.5000 mg | INTRAMUSCULAR | Status: DC | PRN

## 2021-12-10 MED ORDER — SODIUM CHLORIDE (PF) 0.9 % IJ SOLN
INTRAMUSCULAR | Status: AC
  Filled 2021-12-10: qty 50

## 2021-12-10 MED ORDER — METOPROLOL TARTRATE 50 MG PO TABS
100.0000 mg | ORAL_TABLET | Freq: Two times a day (BID) | ORAL | Status: DC
  Administered 2021-12-10 – 2021-12-14 (×8): 100 mg via ORAL
  Filled 2021-12-10 (×8): qty 2

## 2021-12-10 MED ORDER — PROPOFOL 10 MG/ML IV BOLUS
INTRAVENOUS | Status: DC | PRN
  Administered 2021-12-10: 120 mg via INTRAVENOUS

## 2021-12-10 MED ORDER — BISACODYL 10 MG RE SUPP: 10.0000 mg | Freq: Every day | RECTAL | Status: DC | PRN

## 2021-12-10 MED ORDER — TOBRAMYCIN SULFATE 1.2 G IJ SOLR
INTRAMUSCULAR | Status: AC
  Filled 2021-12-10: qty 12

## 2021-12-10 SURGICAL SUPPLY — 60 items
ADH SKN CLS APL DERMABOND .7 (GAUZE/BANDAGES/DRESSINGS) ×1
BAG COUNTER SPONGE SURGICOUNT (BAG) IMPLANT
BAG DECANTER FOR FLEXI CONT (MISCELLANEOUS) IMPLANT
BAG SPEC THK2 15X12 ZIP CLS (MISCELLANEOUS)
BAG SPNG CNTER NS LX DISP (BAG) ×1
BAG ZIPLOCK 12X15 (MISCELLANEOUS) IMPLANT
BLADE SAW SGTL 11.0X1.19X90.0M (BLADE) IMPLANT
BLADE SAW SGTL 13.0X1.19X90.0M (BLADE) ×1 IMPLANT
BLADE SAW SGTL 81X20 HD (BLADE) ×1 IMPLANT
BLADE SURG SZ10 CARB STEEL (BLADE) ×2 IMPLANT
BNDG ELASTIC 6X5.8 VLCR STR LF (GAUZE/BANDAGES/DRESSINGS) ×1 IMPLANT
BRUSH FEMORAL CANAL (MISCELLANEOUS) IMPLANT
CEMENT HV SMART SET (Cement) IMPLANT
COVER SURGICAL LIGHT HANDLE (MISCELLANEOUS) ×1 IMPLANT
CUFF TOURN SGL QUICK 34 (TOURNIQUET CUFF) ×1
CUFF TRNQT CYL 34X4.125X (TOURNIQUET CUFF) ×1 IMPLANT
DERMABOND ADVANCED .7 DNX12 (GAUZE/BANDAGES/DRESSINGS) ×1 IMPLANT
DRAPE INCISE IOBAN 66X45 STRL (DRAPES) ×1 IMPLANT
DRAPE U-SHAPE 47X51 STRL (DRAPES) ×1 IMPLANT
DRESSING AQUACEL AG SP 3.5X10 (GAUZE/BANDAGES/DRESSINGS) IMPLANT
DRSG AQUACEL AG SP 3.5X10 (GAUZE/BANDAGES/DRESSINGS) ×1
DURAPREP 26ML APPLICATOR (WOUND CARE) ×2 IMPLANT
ELECT REM PT RETURN 15FT ADLT (MISCELLANEOUS) ×1 IMPLANT
GAUZE PAD ABD 8X10 STRL (GAUZE/BANDAGES/DRESSINGS) IMPLANT
GAUZE SPONGE 2X2 8PLY STRL LF (GAUZE/BANDAGES/DRESSINGS) IMPLANT
GLOVE BIO SURGEON STRL SZ 6 (GLOVE) ×1 IMPLANT
GLOVE BIOGEL PI IND STRL 6.5 (GLOVE) ×1 IMPLANT
GLOVE BIOGEL PI IND STRL 7.5 (GLOVE) ×1 IMPLANT
GLOVE ORTHO TXT STRL SZ7.5 (GLOVE) ×2 IMPLANT
GOWN STRL REUS W/ TWL LRG LVL3 (GOWN DISPOSABLE) ×2 IMPLANT
GOWN STRL REUS W/TWL LRG LVL3 (GOWN DISPOSABLE) ×2
HANDPIECE INTERPULSE COAX TIP (DISPOSABLE) ×1
HOLDER FOLEY CATH W/STRAP (MISCELLANEOUS) IMPLANT
IMMOBILIZER KNEE 20 (SOFTGOODS) ×1
IMMOBILIZER KNEE 20 THIGH 36 (SOFTGOODS) IMPLANT
KIT TURNOVER KIT A (KITS) IMPLANT
MANIFOLD NEPTUNE II (INSTRUMENTS) ×1 IMPLANT
NDL SAFETY ECLIP 18X1.5 (MISCELLANEOUS) IMPLANT
NS IRRIG 1000ML POUR BTL (IV SOLUTION) ×1 IMPLANT
PACK TOTAL KNEE CUSTOM (KITS) ×1 IMPLANT
PADDING CAST COTTON 6X4 STRL (CAST SUPPLIES) IMPLANT
PROTECTOR NERVE ULNAR (MISCELLANEOUS) ×1 IMPLANT
SET HNDPC FAN SPRY TIP SCT (DISPOSABLE) ×1 IMPLANT
SET PAD KNEE POSITIONER (MISCELLANEOUS) ×1 IMPLANT
SOLUTION IRRIG SURGIPHOR (IV SOLUTION) IMPLANT
SOLUTION PRONTOSAN WOUND 350ML (IRRIGATION / IRRIGATOR) IMPLANT
SPACER FEM 44 A/P 67 M/L MED (Miscellaneous) IMPLANT
SPACER TIB 45 A/P 70 M/L MED (Miscellaneous) IMPLANT
STAPLER VISISTAT 35W (STAPLE) IMPLANT
SUT MNCRL AB 3-0 PS2 18 (SUTURE) ×1 IMPLANT
SUT STRATAFIX PDS+ 0 24IN (SUTURE) ×1 IMPLANT
SUT VIC AB 1 CT1 36 (SUTURE) ×1 IMPLANT
SUT VIC AB 2-0 CT1 27 (SUTURE) ×3
SUT VIC AB 2-0 CT1 TAPERPNT 27 (SUTURE) ×3 IMPLANT
SYR 50ML LL SCALE MARK (SYRINGE) IMPLANT
TOWER CARTRIDGE SMART MIX (DISPOSABLE) ×1 IMPLANT
TUBE KAMVAC SUCTION (TUBING) IMPLANT
TUBE SUCTION HIGH CAP CLEAR NV (SUCTIONS) ×1 IMPLANT
WATER STERILE IRR 1000ML POUR (IV SOLUTION) ×1 IMPLANT
WRAP KNEE MAXI GEL POST OP (GAUZE/BANDAGES/DRESSINGS) ×1 IMPLANT

## 2021-12-10 NOTE — Anesthesia Procedure Notes (Addendum)
Spinal  Patient location during procedure: OR Start time: 12/10/2021 8:50 AM End time: 12/10/2021 9:00 AM Reason for block: surgical anesthesia Staffing Performed: anesthesiologist and resident/CRNA  Anesthesiologist: [name], MD Resident/CRNA: Eben Burow, CRNA Performed by: [name], MD Authorized by: [name], MD   Preanesthetic Checklist Completed: patient identified, IV checked, site marked, risks and benefits discussed, surgical consent, monitors and equipment checked, pre-op evaluation and timeout performed Spinal Block Patient position: sitting Prep: DuraPrep Patient monitoring: blood pressure and continuous pulse ox Approach: midline Location: L3-4 Injection technique: single-shot Needle Needle type: Pencan  Needle gauge: 24 G Needle length: 9 cm Additional Notes Risks and benefits of neuraxial anesthesia including, but not limited to, infection, bleeding, local anesthetic toxicity, headache, hypotension, back pain, block failure, etc. were discussed with the patient. The patient expressed understanding and consented to the procedure. I confirmed that the patient has no bleeding disorders and is not taking blood thinners. I confirmed the patient's last platelet count with the nurse. Monitors were applied. A time-out was performed immediately prior to the procedure. Sterile technique was used throughout the whole procedure. First attempt by CRNA unsuccessful. First attempt by attending with CSF return but would not aspirate. CSF then would not flow at all. Second attempt by attending unsuccessful. Will do GA instead.  3 attempt(s)

## 2021-12-10 NOTE — Consult Note (Signed)
[facility] for Infectious Disease  Total days of antibiotics 1 Reason for Consult: right knee pji   Referring Physician: olin  Principal Problem:   Infection of prosthetic right knee joint ([place])    HPI: Susan Turner is a 76 y.o. female with HTN, HLD, arthritis with hx of right knee TKA in 2012, started to have pain to right knee 2 months ago after a fall. Had followed up with dr Alvan Dame who determined she had loosening of femoral component and was admitted for revision of TKA. Upon entering joint space, the synovial fluid appeared consistent with infection due to murky appearnce. Thus, fluid sent for culture, extensive synovectomy performed, and original HW removed. Articulating abtx spacer implanted. ID asked to weigh in on abtx management of pji. Patient denied having recent fever, chills, nightsweats or erythema about her right knee.   No abtx allergies. In the past had been on doxycycline for which it did not agree with her, but no rash.  Past Medical History:  Diagnosis Date   Anemia    Arthritis    Gout    Hyperlipidemia    Hypertension    Iron deficiency anemia    Lipoma of back    Pneumonia    Pre-diabetes    URI (upper respiratory infection)    Weight loss     Allergies:  Allergies  Allergen Reactions   Lisinopril Cough   Codeine Other (See Comments)    Tachycardia    Motrin [Ibuprofen]     Pt does not remember reaction     Current antibiotics:   MEDICATIONS:  amLODipine  5 mg Oral BID   aspirin  81 mg Oral BID   [START ON 12/11/2021] dexamethasone (DECADRON) injection  10 mg Intravenous Once   docusate sodium  100 mg Oral BID   gabapentin  300 mg Oral TID   insulin aspart  0-15 Units Subcutaneous TID WC   [START ON 12/11/2021] irbesartan  300 mg Oral Daily   metoprolol tartrate  100 mg Oral BID   polyethylene glycol  17 g Oral BID   [START ON 12/13/2021] rosuvastatin  5 mg Oral Weekly    Social History   Tobacco Use   Smoking status: Never    Smokeless tobacco: Never  Vaping Use   Vaping Use: Never used  Substance Use Topics   Alcohol use: No   Drug use: No    Family History  Problem Relation Age of Onset   Hypertension Mother    Stroke Mother    Cancer Father     Review of Systems -   Constitutional: Negative for fever, chills, diaphoresis, activity change, appetite change, fatigue and unexpected weight change.  HENT: Negative for congestion, sore throat, rhinorrhea, sneezing, trouble swallowing and sinus pressure.  Eyes: Negative for photophobia and visual disturbance.  Respiratory: Negative for cough, chest tightness, shortness of breath, wheezing and stridor.  Cardiovascular: Negative for chest pain, palpitations and leg swelling.  Gastrointestinal: +for nausea, vomiting, abdominal pain, diarrhea, constipation, blood in stool, abdominal distention and anal bleeding.  Genitourinary: Negative for dysuria, hematuria, flank pain and difficulty urinating.  Musculoskeletal: Negative for myalgias, back pain, joint swelling, arthralgias and gait problem.  Skin: Negative for color change, pallor, rash and wound.  Neurological: + dizziness, tremors, weakness and light-headedness.  Hematological: Negative for adenopathy. Does not bruise/bleed easily.  Psychiatric/Behavioral: Negative for behavioral problems, confusion, sleep disturbance, dysphoric mood, decreased concentration and agitation.     OBJECTIVE: Temp:  [97.5  F (36.4 C)-97.8 F (36.6 C)] 97.5 F (36.4 C) (12/07 1314) Pulse Rate:  [54-125] 61 (12/07 1448) Resp:  [7-19] 19 (12/07 1314) BP: (135-198)/(62-85) 138/62 (12/07 1448) SpO2:  [93 %-100 %] 100 % (12/07 1314) Weight:  [90 kg] 90 kg (12/07 0705) Physical Exam  Constitutional:  oriented to person, place, and time. appears well-developed and well-nourished. No distress.  HENT: [place]/AT, PERRLA, no scleral icterus Mouth/Throat: Oropharynx is clear and moist. No oropharyngeal exudate.  Cardiovascular:  Normal rate, regular rhythm and normal heart sounds. Exam reveals no gallop and no friction rub.  No murmur heard.  Pulmonary/Chest: Effort normal and breath sounds normal. No respiratory distress.  has no wheezes.  Neck = supple, no nuchal rigidity Abdominal: Soft. Bowel sounds are normal.  exhibits no distension. There is no tenderness.  Ext: right knee foam brace in place Lymphadenopathy: no cervical adenopathy. No axillary adenopathy Neurological: alert and oriented to person, place, and time.  Skin: Skin is warm and dry. No rash noted. No erythema.  Psychiatric: a normal mood and affect.  behavior is normal.    LABS: Results for orders placed or performed during the hospital encounter of 12/10/21 (from the past 48 hour(s))  Aerobic/Anaerobic Culture w Gram Stain (surgical/deep wound)     Status: None (Preliminary result)   Collection Time: 12/10/21  9:30 AM   Specimen: Synovial, Right Knee; Body Fluid  Result Value Ref Range   Specimen Description SYNOVIAL RT KNEE    Special Requests NONE    Gram Stain      ABUNDANT WBC PRESENT, PREDOMINANTLY PMN NO ORGANISMS SEEN Gram Stain Report Called to,Read Back By and Verified With: MCNEIL,C. RN '@[id number]'$  ON 12.7.2023 BY [facility] Performed at [facility], [place] [address], [place], [place] [address]    Culture PENDING    Report Status PENDING   Glucose, capillary     Status: Abnormal   Collection Time: 12/10/21  3:30 PM  Result Value Ref Range   Glucose-Capillary 212 (H) 70 - 99 mg/dL    Comment: Glucose reference range applies only to samples taken after fasting for at least 8 hours.    MICRO: She is mssa colonized Culture from OR is pending IMAGING: [place] EKG SITE RITE  Result Date: 12/10/2021 If Site Rite image not attached, placement could not be confirmed due to current cardiac rhythm.    Assessment/Plan:  76yo F with right pji s/p resection of HW and placement of articulating antibiotic spacer.  - anticipate  to treat for 6 wk , will empirically start with iv daptomycin and ceftriaxone - will check ck, sed rate and crp - will get picc line placed - depending on cutlure results, will narrow abtx and do antibiotic administration teaching - for the time being continue to work with PT/OT to see if she will need SNF for rehab vs being able to get home

## 2021-12-10 NOTE — Progress Notes (Signed)
RN made aware that the patient's daughter would like to wait for the PICC to be placed after insurance clarify payment.

## 2021-12-10 NOTE — Brief Op Note (Signed)
12/10/2021  8:57 AM  PATIENT:  Susan Turner  76 y.o. 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Other Dictation: Dictation Number [id number]  PLAN OF CARE: Admit to inpatient   PATIENT DISPOSITION:  PACU - hemodynamically stable.   Delay start of Pharmacological VTE agent (>24hrs) due to surgical blood loss or risk of bleeding: no

## 2021-12-10 NOTE — Anesthesia Postprocedure Evaluation (Signed)
Anesthesia Post Note  Patient: Susan Turner  Procedure(s) Performed: TOTAL KNEE REVISION (Right: Knee)     Patient location during evaluation: PACU Anesthesia Type: MAC Level of consciousness: awake Pain management: pain level controlled Vital Signs Assessment: post-procedure vital signs reviewed and stable Respiratory status: spontaneous breathing, nonlabored ventilation and respiratory function stable Cardiovascular status: blood pressure returned to baseline and stable Postop Assessment: no apparent nausea or vomiting Anesthetic complications: no   No notable events documented.  Last Vitals:  Vitals:   12/10/21 1245 12/10/21 1300  BP: (!) 144/67 (!) 152/70  Pulse: (!) 54 (!) 57  Resp: (!) 7 12  Temp:    SpO2: 95% 97%    Last Pain:  Vitals:   12/10/21 1245  TempSrc:   PainSc: Asleep                 [name]

## 2021-12-10 NOTE — H&P (Signed)
TOTAL KNEE REVISION ADMISSION H&P  Patient is being admitted for right revision total knee arthroplasty.  Subjective:  Chief Complaint:right knee pain.  HPI: Susan Turner, 76 y.o. female. She has a history of primary right TKA in 2012 by Dr. Alvan Dame. She had been having some pain in the knee over the past few years, but worsening pain after a recent fall. Imaging after the fall revealed loosening of the femoral component.   Patient Active Problem List   Diagnosis Date Noted   Essential hypertension 05/31/2015   Hyperglycemia 05/31/2015   Arthritis 05/31/2015   Gallstone 05/31/2015   Past Medical History:  Diagnosis Date   Anemia    Arthritis    Gout    Hyperlipidemia    Hypertension    Iron deficiency anemia    Lipoma of back    Pneumonia    Pre-diabetes    URI (upper respiratory infection)    Weight loss     Past Surgical History:  Procedure Laterality Date   DILATION AND CURETTAGE OF UTERUS     HAND SURGERY Right    Lipoma removed   JOINT REPLACEMENT     TOTAL KNEE ARTHROPLASTY Right     Current Facility-Administered Medications  Medication Dose Route Frequency Provider Last Rate Last Admin   acetaminophen (TYLENOL) tablet 1,000 mg  1,000 mg Oral Once [name], MD       ceFAZolin (ANCEF) IVPB 2g/100 mL premix  2 g Intravenous On Call to OR Irving Copas, PA-C       chlorhexidine (PERIDEX) 0.12 % solution 15 mL  15 mL Mouth/Throat Once Belinda Block, MD       Or   Oral care mouth rinse  15 mL Mouth Rinse Once Belinda Block, MD       dexamethasone (DECADRON) injection 8 mg  8 mg Intravenous Once Irving Copas, PA-C       lactated ringers infusion   Intravenous Continuous Costella Hatcher R, PA-C       lactated ringers infusion   Intravenous Continuous Belinda Block, MD       povidone-iodine 10 % swab 2 Application  2 Application Topical Once Irving Copas, PA-C       tranexamic acid (CYKLOKAPRON) IVPB 1,000 mg  1,000 mg Intravenous  To OR Irving Copas, PA-C       Allergies  Allergen Reactions   Lisinopril Cough   Codeine Other (See Comments)    Tachycardia    Motrin [Ibuprofen]     Pt does not remember reaction     Social History   Tobacco Use   Smoking status: Never   Smokeless tobacco: Never  Substance Use Topics   Alcohol use: No    Family History  Problem Relation Age of Onset   Hypertension Mother    Stroke Mother    Cancer Father       Review of Systems  Constitutional:  Negative for chills and fever.  Respiratory:  Negative for cough and shortness of breath.   Cardiovascular:  Negative for chest pain.  Gastrointestinal:  Negative for nausea and vomiting.  Musculoskeletal:  Positive for arthralgias.      Objective:  Physical Exam Well nourished and well developed. General: Alert and oriented x3, cooperative and pleasant, no acute distress. Head: normocephalic, atraumatic, neck supple. Eyes: EOMI.  Musculoskeletal: Right knee exam: Her surgical incision from her index arthroplasty is healed without signs of infection, no erythema or warmth No palpable effusion Full  knee extension and flexion over 100 degrees with terminal tightness Tenderness to palpation about the knee particularly proximally Stable medial lateral collateral ligaments   Calves soft and nontender. Motor function intact in LE. Strength 5/5 LE bilaterally. Neuro: Distal pulses 2+. Sensation to light touch intact in LE.  Vital signs in last 24 hours: Temp:  [97.6 F (36.4 C)] 97.6 F (36.4 C) (12/07 6010) Pulse Rate:  [60] 60 (12/07 0614) Resp:  [18] 18 (12/07 0614) BP: (135)/(83) 135/83 (12/07 0614) SpO2:  [96 %] 96 % (12/07 0614)  Labs:  Estimated body mass index is 33.28 kg/m as calculated from the following:   Height as of 12/03/21: '5\' 5"'$  (1.651 m).   Weight as of 12/03/21: 90.7 kg.  Imaging Review Imaging: I reviewed a series of radiographs of her right knee from October 30, 2021. Reviewing  these radiographs as compared to previous radiographs there is concerns for loosening of the femoral component particularly over the anterior flange of the component.    Assessment/Plan:  Aseptic loosening ,right total knee  The patient history, physical examination, clinical judgment of the provider and imaging studies are consistent with aseptic loosening of the right knee(s), previous total knee arthroplasty. Revision total knee arthroplasty is deemed medically necessary. The treatment options including medical management, injection therapy, arthroscopy and revision arthroplasty were discussed at length. The risks and benefits of revision total knee arthroplasty were presented and reviewed. The risks due to aseptic loosening, infection, stiffness, patella tracking problems, thromboembolic complications and other imponderables were discussed. The patient acknowledged the explanation, agreed to proceed with the plan and consent was signed. Patient is being admitted for inpatient treatment for surgery, pain control, PT, OT, prophylactic antibiotics, VTE prophylaxis, progressive ambulation and ADL's and discharge planning.The patient is planning to be discharged  home.  Therapy Plans: HHPT vs outpatient therapy at [facility] Disposition: Home with daughter & sister, and caregiver Sonia Side Planned DVT Prophylaxis: aspirin '81mg'$  BID DME needed: walker PCP: Dr. Maury Dus, TXA: Allergies: ibuprofen - dizziness, codeine - tachycardia Anesthesia Concerns: BMI: 33.6 Last HgbA1c: 6.5%   Other:  - hydrocodone, robaxin, tylenol - No hx of VTE or CA  Costella Hatcher, PA-C Orthopedic Surgery [facility] [place] [phone]

## 2021-12-10 NOTE — Anesthesia Procedure Notes (Signed)
Procedure Name: Intubation Date/Time: 12/10/2021 9:08 AM  Performed by: Eben Burow, CRNAPre-anesthesia Checklist: Patient identified, Emergency Drugs available, Suction available, Patient being monitored and Timeout performed Patient Re-evaluated:Patient Re-evaluated prior to induction Oxygen Delivery Method: Circle system utilized Preoxygenation: Pre-oxygenation with 100% oxygen Induction Type: IV induction Ventilation: Mask ventilation without difficulty Laryngoscope Size: Mac and 4 Grade View: Grade I Tube type: Oral Number of attempts: 1 Airway Equipment and Method: Stylet Placement Confirmation: ETT inserted through vocal cords under direct vision, positive ETCO2 and breath sounds checked- equal and bilateral Secured at: 22 cm Tube secured with: Tape Dental Injury: Teeth and Oropharynx as per pre-operative assessment

## 2021-12-10 NOTE — Progress Notes (Signed)
At bedside for PICC placement. Daughter at bedside. Risks/benefits of placement explained to All questions answered and consent obtained for placement, however, daughter wants to wait until  more information regarding insurance payments have been clarified. Adequate PIV access at this time. RN aware

## 2021-12-10 NOTE — Transfer of Care (Signed)
Immediate Anesthesia Transfer of Care Note  Patient: Susan Turner  Procedure(s) Performed: TOTAL KNEE REVISION (Right: Knee)  Patient Location: PACU  Anesthesia Type:General  Level of Consciousness: awake, alert , and patient cooperative  Airway & Oxygen Therapy: Patient Spontanous Breathing and Patient connected to face mask oxygen  Post-op Assessment: Report given to RN and Post -op Vital signs reviewed and stable  Post vital signs: Reviewed and stable  Last Vitals:  Vitals Value Taken Time  BP 174/75 12/10/21 1139  Temp    Pulse 95 12/10/21 1143  Resp 13 12/10/21 1143  SpO2 83 % 12/10/21 1143  Vitals shown include unvalidated device data.  Last Pain:  Vitals:   12/10/21 0828  TempSrc:   PainSc: 0-No pain         Complications: No notable events documented.

## 2021-12-10 NOTE — Op Note (Signed)
NAME: Susan Turner, Susan Turner MEDICAL RECORD NO: [id number] ACCOUNT NO: [id number] DATE OF BIRTH: [birth year] FACILITY: [facility] LOCATION: [id number] PHYSICIAN: Pietro Cassis. 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  POSTOPERATIVE DIAGNOSIS:  Failed right total knee arthroplasty due to septic loosening based on intraoperative findings.  PROCEDURE: 1.  Resection of her previously placed right total knee arthroplasty. 2.  Excisional and non-excisional debridement of the right knee.  The excisional debridement was carried out with the use of a combination of a scalpel and the Bovie of at least 8 inch incision, excising skin, subcutaneous nonviable tissue, synovium and  scar, bone and retained cement followed by a non-excisional debridement using 7 liters of normal saline solution in combination with a bottle of Betadine impregnated solution followed by solution of Prontosan antimicrobial solution. 3.  Placement of a temporary antibiotic laid articulating knee spacer.  SURGEON:  Pietro Cassis. Alvan Dame, MD  ASSISTANT:  Costella Hatcher, PA-C.  Note that [name] was present for the entirety of the case from preoperative positioning, perioperative management of the operative extremity, general facilitation of the case and primary wound closure.  ANESTHESIA:  Regional plus general.  BLOOD LOSS:  Less than 200 mL.  DRAINS:  None.  TOURNIQUET:  Utilized at 250 mmHg for approximately 100 minutes.  INDICATIONS FOR PROCEDURE:  The patient is a 76 year old female with history of right total knee arthroplasty from about 2012.  She presented to the office at our urgent care after a fall.  At the time, serial radiographs had not been obtained for a  while.  It appeared that perhaps after the fall there were some mechanical based loosening of her femoral component.  This was confirmed through further studies.   Based on her presentation and the loosening it felt that this was a traumatic incident and  not related to any chronic issues like infection.  For that reason, I did not perform sedimentation rate and C-reactive protein.  We initially discussed with her that we would be planning on revising her knee and plan accordingly.  Standard risks were  reviewed prior to surgery including the effort that was required to maximize her function return after surgery.  Consent was obtained in the office for benefit of pain control and management of the findings radiographically.  DESCRIPTION OF PROCEDURE:  The patient was brought to the operative theater.  Once adequate anesthesia, preoperative antibiotics initially administered, Ancef, she was positioned supine with a thigh tourniquet placed.  The right lower extremity was then  prepped and draped in sterile fashion.  A timeout was performed identifying the patient, planned procedure, and extremity.  The right lower extremity was then exsanguinated and tourniquet elevated to 250 mmHg.  I excised her old incision to straighten it  out.  Soft tissue planes were then created.  Median arthrotomy was made.  Once I entered the joint, the joint fluid itself had a murky, cloudy appearance.  This changed our entire approach in this case that we had opened up all instrumentation for  revision we had to pivot and planned for resection with placement of an antibiotic spacer.  Given these findings, I then completed the arthrotomy. We obtained fluid for culture.  I then performed an extensive synovectomy medially, laterally as well as in  the suprapatellar region extending multiple biopsies of soft tissue for culture purposes.  Once I had done the exposure and synovectomy I used  an oscillating saw to undermine the bone cement interface on the tibia.  It was fairly stable and did require  the use of an osteotome.  On the femoral side the femur was just taken off the end of the bone,  somewhat as anticipated.  Once I removed these components I performed further synovectomy and debridement posteriorly.  Once I had obtained enough samples I  sent this off for Gram stain, culture analysis.  At this point, we opened up the canals and reamed up to 16 mm on the femur and 14 mm on the tibia.  I then irrigated the canals with pulse lavage with a canal brush irrigator for about a liter of fluid.   We then irrigated the knee with 3 liters of normal saline solution.  I then placed Betadine in the wound as we then measured and selected the size medium KASM articulating spacer system.  We mixed two batches of cement with 3 grams of vancomycin and 3.6  of tobramycin and made the tibial mold as well as tibial and femoral dowels.  Once these fully cured, we mixed another two batches of cement with 2 grams of vancomycin and 2.4 tobramycin for the femoral mold.  Once this cement had fully cured on the  tibia side, we placed the dowels into the femur and tibia and placed the femoral mold onto the femur and held in place until it fully cemented and cured.  I then mixed another batch of cement to cement the tibial tray into place.  Knee was brought to  extension.  I removed the patellar button.  We irrigated the knee with another 3 liters of normal saline solution and then also applied more of the Prontosan solution.  Once the cement had fully cured and we performed a final non-excisional debridements,  the extensor mechanism was reapproximated using #1 Vicryl and #1 Stratafix suture.  The remainder wound was closed in 2-0 Vicryl and a running Monocryl stitch.  The wound was clean, dry and dressed sterilely using surgical glue and Aquacel dressing.   She was then brought to the recovery room in stable condition, tolerating the procedure well.  I did review these findings with her daughter.  She will receive 6 weeks of IV  antibiotics through PICC line.  We will then work on scheduling a reimplantation within  4-6 weeks to make certain that she is free of infection.   PUS D: 12/10/2021 11:27:44 am T: 12/10/2021 2:42:00 pm  JOB: [id number]/ [id number]

## 2021-12-10 NOTE — Discharge Instructions (Signed)

## 2021-12-10 NOTE — Anesthesia Procedure Notes (Addendum)
Anesthesia Regional Block: Adductor canal block   Pre-Anesthetic Checklist: , timeout performed,  Correct Patient, Correct Site, Correct Laterality,  Correct Procedure, Correct Position, site marked,  Risks and benefits discussed,  Surgical consent,  Pre-op evaluation,  At surgeon's request and post-op pain management  Laterality: Right  Prep: chloraprep       Needles:  Injection technique: Single-shot  Needle Type: Echogenic Stimulator Needle     Needle Length: 9cm  Needle Gauge: 21     Additional Needles:   Procedures:,,,, ultrasound used (permanent image in chart),,    Narrative:  Start time: 12/10/2021 8:08 AM End time: 12/10/2021 8:11 AM Injection made incrementally with aspirations every 5 mL.  Performed by: Personally  Anesthesiologist: [name], MD  Additional Notes: Discussed risks and benefits of nerve block including, but not limited to, prolonged and/or permanent nerve injury involving sensory and/or motor function. Monitors were applied and a time-out was performed. The nerve and associated structures were visualized under ultrasound guidance. After negative aspiration, local anesthetic was slowly injected around the nerve. There was no evidence of high pressure during the procedure. There were no paresthesias. VSS remained stable and the patient tolerated the procedure well.

## 2021-12-11 ENCOUNTER — Encounter (HOSPITAL_COMMUNITY): Payer: Self-pay | Admitting: Orthopedic Surgery

## 2021-12-11 DIAGNOSIS — T8453XD Infection and inflammatory reaction due to internal right knee prosthesis, subsequent encounter: Secondary | ICD-10-CM

## 2021-12-11 LAB — CBC WITH DIFFERENTIAL/PLATELET
Abs Immature Granulocytes: 0.03 10*3/uL (ref 0.00–0.07)
Basophils Absolute: 0 10*3/uL (ref 0.0–0.1)
Basophils Relative: 0 %
Eosinophils Absolute: 0 10*3/uL (ref 0.0–0.5)
Eosinophils Relative: 0 %
HCT: 31.8 % — ABNORMAL LOW (ref 36.0–46.0)
Hemoglobin: 10 g/dL — ABNORMAL LOW (ref 12.0–15.0)
Immature Granulocytes: 0 %
Lymphocytes Relative: 12 %
Lymphs Abs: 0.8 10*3/uL (ref 0.7–4.0)
MCH: 25.6 pg — ABNORMAL LOW (ref 26.0–34.0)
MCHC: 31.4 g/dL (ref 30.0–36.0)
MCV: 81.5 fL (ref 80.0–100.0)
Monocytes Absolute: 0.6 10*3/uL (ref 0.1–1.0)
Monocytes Relative: 9 %
Neutro Abs: 5.3 10*3/uL (ref 1.7–7.7)
Neutrophils Relative %: 79 %
Platelets: 167 10*3/uL (ref 150–400)
RBC: 3.9 MIL/uL (ref 3.87–5.11)
RDW: 16.4 % — ABNORMAL HIGH (ref 11.5–15.5)
WBC: 6.8 10*3/uL (ref 4.0–10.5)
nRBC: 0 % (ref 0.0–0.2)

## 2021-12-11 LAB — CK: Total CK: 185 U/L (ref 38–234)

## 2021-12-11 LAB — CBC
HCT: 32 % — ABNORMAL LOW (ref 36.0–46.0)
Hemoglobin: 10.4 g/dL — ABNORMAL LOW (ref 12.0–15.0)
MCH: 26.1 pg (ref 26.0–34.0)
MCHC: 32.5 g/dL (ref 30.0–36.0)
MCV: 80.2 fL (ref 80.0–100.0)
Platelets: 177 10*3/uL (ref 150–400)
RBC: 3.99 MIL/uL (ref 3.87–5.11)
RDW: 16.3 % — ABNORMAL HIGH (ref 11.5–15.5)
WBC: 7.8 10*3/uL (ref 4.0–10.5)
nRBC: 0 % (ref 0.0–0.2)

## 2021-12-11 LAB — GLUCOSE, CAPILLARY
Glucose-Capillary: 116 mg/dL — ABNORMAL HIGH (ref 70–99)
Glucose-Capillary: 149 mg/dL — ABNORMAL HIGH (ref 70–99)
Glucose-Capillary: 176 mg/dL — ABNORMAL HIGH (ref 70–99)
Glucose-Capillary: 166 mg/dL — ABNORMAL HIGH (ref 70–99)

## 2021-12-11 LAB — BASIC METABOLIC PANEL
Anion gap: 8 (ref 5–15)
BUN: 14 mg/dL (ref 8–23)
CO2: 25 mmol/L (ref 22–32)
Calcium: 8.3 mg/dL — ABNORMAL LOW (ref 8.9–10.3)
Chloride: 108 mmol/L (ref 98–111)
Creatinine, Ser: 0.66 mg/dL (ref 0.44–1.00)
GFR, Estimated: >60 mL/min (ref >60)
Glucose, Bld: 141 mg/dL — ABNORMAL HIGH (ref 70–99)
Potassium: 3.8 mmol/L (ref 3.5–5.1)
Sodium: 141 mmol/L (ref 135–145)

## 2021-12-11 LAB — C-REACTIVE PROTEIN: CRP: 2 mg/dL — ABNORMAL HIGH (ref <1.0)

## 2021-12-11 LAB — SEDIMENTATION RATE: Sed Rate: 17 mm/hr (ref 0–22)

## 2021-12-11 LAB — SURGICAL PATHOLOGY

## 2021-12-11 MED ORDER — CHLORHEXIDINE GLUCONATE CLOTH 2 % EX PADS
6.0000 | MEDICATED_PAD | Freq: Every day | CUTANEOUS | Status: DC
  Administered 2021-12-11 – 2021-12-14 (×4): 6 via TOPICAL

## 2021-12-11 MED ORDER — SODIUM CHLORIDE 0.9% FLUSH
10.0000 mL | Freq: Two times a day (BID) | INTRAVENOUS | Status: DC
  Administered 2021-12-11 – 2021-12-14 (×3): 10 mL

## 2021-12-11 MED ORDER — SODIUM CHLORIDE 0.9% FLUSH: 10.0000 mL | INTRAVENOUS | Status: DC | PRN

## 2021-12-11 NOTE — Progress Notes (Signed)
Peripherally Inserted Central Catheter Placement  The IV Nurse has discussed with the patient and/or persons authorized to consent for the patient, the purpose of this procedure and the potential benefits and risks involved with this procedure.  The benefits include less needle sticks, lab draws from the catheter, and the patient may be discharged home with the catheter. Risks include, but not limited to, infection, bleeding, blood clot (thrombus formation), and puncture of an artery; nerve damage and irregular heartbeat and possibility to perform a PICC exchange if needed/ordered by physician.  Alternatives to this procedure were also discussed.  Bard Power PICC patient education guide, fact sheet on infection prevention and patient information card has been provided to patient /or left at bedside.    PICC Placement Documentation  PICC Single Lumen 38/88/75 Right Basilic 39 cm 0 cm (Active)  Indication for Insertion or Continuance of Line Home intravenous therapies (PICC only) 12/11/21 1400  Exposed Catheter (cm) 0 cm 12/11/21 1400  Site Assessment Clean, Dry, Intact 12/11/21 1400  Line Status Flushed;Saline locked;Blood return noted 12/11/21 1400  Dressing Type Transparent;Securing device 12/11/21 1400  Dressing Status Antimicrobial disc in place;Clean, Dry, Intact 12/11/21 1400  Safety Lock Not Applicable 79/72/82 0601  Line Care Connections checked and tightened 12/11/21 1400  Dressing Intervention New dressing 12/11/21 1400  Dressing Change Due 12/18/21 12/11/21 1400       Holley Bouche Renee 12/11/2021, 2:34 PM

## 2021-12-11 NOTE — Progress Notes (Signed)
   Subjective: 1 Day Post-Op Procedure(s) (LRB): TOTAL KNEE REVISION (Right) Patient reports pain as mild.   Patient seen in rounds for Dr. Alvan Dame. Patient is well, and has had no acute complaints or problems. No acute events overnight. She is sitting up in bed finishing breakfast. Foley catheter removed.  Patient tells me she expected there to be infection in the knee as she had felt warmth in it over the past few months. Her nausea improved overnight.   We will start therapy today.   Objective: Vital signs in last 24 hours: Temp:  [97.5 F (36.4 C)-98.3 F (36.8 C)] 98.3 F (36.8 C) (12/08 0543) Pulse Rate:  [54-125] 66 (12/08 0543) Resp:  [7-19] 16 (12/08 0543) BP: (126-180)/(62-77) 158/68 (12/08 0543) SpO2:  [93 %-100 %] 99 % (12/08 0543)  Intake/Output from previous day:  Intake/Output Summary (Last 24 hours) at 12/11/2021 0824 Last data filed at 12/11/2021 0600 Gross per 24 hour  Intake 3962.83 ml  Output 1575 ml  Net 2387.83 ml     Intake/Output this shift: No intake/output data recorded.  Labs: No results for input(s): "HGB" in the last 72 hours. No results for input(s): "WBC", "RBC", "HCT", "PLT" in the last 72 hours. Recent Labs    12/11/21 0342  NA 141  K 3.8  CL 108  CO2 25  BUN 14  CREATININE 0.66  GLUCOSE 141*  CALCIUM 8.3*   No results for input(s): "LABPT", "INR" in the last 72 hours.  Exam: General - Patient is Alert and Oriented Extremity - Neurologically intact Sensation intact distally Intact pulses distally Dorsiflexion/Plantar flexion intact Dressing - dressing C/D/I Motor Function - intact, moving foot and toes well on exam.   Past Medical History:  Diagnosis Date   Anemia    Arthritis    Gout    Hyperlipidemia    Hypertension    Iron deficiency anemia    Lipoma of back    Pneumonia    Pre-diabetes    URI (upper respiratory infection)    Weight loss     Assessment/Plan: 1 Day Post-Op Procedure(s) (LRB): TOTAL KNEE  REVISION (Right) Principal Problem:   Infection of prosthetic right knee joint (HCC)  Estimated body mass index is 33.02 kg/m as calculated from the following:   Height as of this encounter: '5\' 5"'$  (1.651 m).   Weight as of this encounter: 90 kg. Advance diet Up with therapy    DVT Prophylaxis - Aspirin PWB 50% RLE - in knee immobilizer with ambulation May remove immobilizer when resting in bed  CBC pending --   Intra-op cultures pending. No growth currently. Appreciate Dr. Storm Frisk help in managing this patient.   Talked with the patient and am still unsure of the question of insurance approval prior to PICC placement? Patient seems to be thinking of needing to get short term disability approved, but this would not impact inpatient decisions. She will need PICC placement prior to discharge.    Griffith Citron, PA-C Orthopedic Surgery [phone] 12/11/2021, 8:24 AM

## 2021-12-11 NOTE — Evaluation (Signed)
Physical Therapy Evaluation Patient Details Name: Susan Turner MRN: [id number] DOB: 11/08/1945 Today's Date: 12/11/2021  History of Present Illness  Pt s/p R TKR removal with spacer placement 2* infection.  Pt with hx of anemia, pre-diabetes, and R TKR in 2012  Clinical Impression  Pt admitted as above and presenting with functional mobility limitations 2* decreased R LE strength/ROM, post op pain and PWB on R LE.  Pt should progress to dc home with family assist.     Recommendations for follow up therapy are one component of a multi-disciplinary discharge planning process, led by the attending physician.  Recommendations may be updated based on patient status, additional functional criteria and insurance authorization.  Follow Up Recommendations Follow physician's recommendations for discharge plan and follow up therapies      Assistance Recommended at Discharge Frequent or constant Supervision/Assistance  Patient can return home with the following  A little help with walking and/or transfers;A little help with bathing/dressing/bathroom;Assistance with cooking/housework;Assist for transportation;Help with stairs or ramp for entrance    Equipment Recommendations Rolling walker (2 wheels) (youth RW)  Recommendations for Other Services       Functional Status Assessment Patient has had a recent decline in their functional status and demonstrates the ability to make significant improvements in function in a reasonable and predictable amount of time.     Precautions / Restrictions Precautions Precautions: Fall Restrictions Weight Bearing Restrictions: Yes RLE Weight Bearing: Partial weight bearing RLE Partial Weight Bearing Percentage or Pounds: 50      Mobility  Bed Mobility Overal bed mobility: Needs Assistance Bed Mobility: Supine to Sit     Supine to sit: Min assist     General bed mobility comments: Increased time with use of bedrail and min assist to manage R LE     Transfers Overall transfer level: Needs assistance Equipment used: Rolling walker (2 wheels) Transfers: Sit to/from Stand Sit to Stand: Min assist, Mod assist           General transfer comment: cues for LE management and use of UEs to self assist    Ambulation/Gait Ambulation/Gait assistance: Min assist Gait Distance (Feet): 38 Feet Assistive device: Rolling walker (2 wheels) Gait Pattern/deviations: Step-to pattern, Decreased step length - right, Decreased step length - left, Shuffle, Trunk flexed Gait velocity: decr     General Gait Details: cues for sequence, posure and position from [employer] Mobility    Modified Rankin (Stroke Patients Only)       Balance                                             Pertinent Vitals/Pain Pain Assessment Pain Assessment: 0-10 Pain Score: 4  Pain Location: R knee Pain Descriptors / Indicators: Aching, Sore Pain Intervention(s): Limited activity within patient's tolerance, Monitored during session, Premedicated before session, Ice applied    Home Living Family/patient expects to be discharged to:: Private residence Living Arrangements: Alone Available Help at Discharge: Family;Available 24 hours/day Type of Home: House Home Access: Level entry     Alternate Level Stairs-Number of Steps: 2 Home Layout: Two level        Prior Function Prior Level of Function : Independent/Modified Independent  Hand Dominance        Extremity/Trunk Assessment   Upper Extremity Assessment Upper Extremity Assessment: Overall WFL for tasks assessed    Lower Extremity Assessment Lower Extremity Assessment: RLE deficits/detail RLE Deficits / Details: 2+/5 quads with AAROM at knee -10 - 45    Cervical / Trunk Assessment Cervical / Trunk Assessment: Kyphotic  Communication   Communication: No difficulties  Cognition Arousal/Alertness:  Awake/alert Behavior During Therapy: WFL for tasks assessed/performed Overall Cognitive Status: Within Functional Limits for tasks assessed                                          General Comments      Exercises Total Joint Exercises Ankle Circles/Pumps: AROM, Both, 15 reps, Supine Quad Sets: AROM, Both, 10 reps, Supine Heel Slides: AAROM, Right, 10 reps, Supine Straight Leg Raises: AAROM, Right, 10 reps, Supine   Assessment/Plan    PT Assessment Patient needs continued PT services  PT Problem List Decreased strength;Decreased range of motion;Decreased activity tolerance;Decreased balance;Decreased mobility;Decreased knowledge of use of DME;Pain       PT Treatment Interventions DME instruction;Gait training;Stair training;Functional mobility training;Therapeutic activities;Therapeutic exercise;Patient/family education    PT Goals (Current goals can be found in the Care Plan section)  Acute Rehab PT Goals Patient Stated Goal: Regain IND PT Goal Formulation: With patient Time For Goal Achievement: 12/18/21 Potential to Achieve Goals: Good    Frequency 7X/week     Co-evaluation               AM-PAC PT "6 Clicks" Mobility  Outcome Measure Help needed turning from your back to your side while in a flat bed without using bedrails?: A Little Help needed moving from lying on your back to sitting on the side of a flat bed without using bedrails?: A Lot Help needed moving to and from a bed to a chair (including a wheelchair)?: A Lot Help needed standing up from a chair using your arms (e.g., wheelchair or bedside chair)?: A Lot Help needed to walk in hospital room?: A Little Help needed climbing 3-5 steps with a railing? : A Lot 6 Click Score: 14    End of Session Equipment Utilized During Treatment: Gait belt;Right knee immobilizer Activity Tolerance: Patient tolerated treatment well Patient left: in chair;with call bell/phone within reach;with chair  alarm set Nurse Communication: Mobility status PT Visit Diagnosis: Difficulty in walking, not elsewhere classified (R26.2)    Time: 0825-0905 PT Time Calculation (min) (ACUTE ONLY): 40 min   Charges:   PT Evaluation $PT Eval Low Complexity: 1 Low PT Treatments $Gait Training: 8-22 mins $Therapeutic Exercise: 8-22 mins        Debe Coder PT Acute Rehabilitation Services Pager [phone] Office [phone]   Jhaniya Briski 12/11/2021, 10:56 AM

## 2021-12-11 NOTE — Progress Notes (Signed)
    [facility] for [facility]    Date of Admission:  12/10/2021   Total days of antibiotics 2   ID: Susan Turner is a 76 y.o. female with   Principal Problem:   Infection of prosthetic right knee joint (HCC)    Subjective: Afebrile, mild right knee pain. Tolerated some ambulation with PT this morning  Medications:   amLODipine  5 mg Oral BID   aspirin  81 mg Oral BID   docusate sodium  100 mg Oral BID   gabapentin  300 mg Oral TID   insulin aspart  0-15 Units Subcutaneous TID WC   irbesartan  300 mg Oral Daily   metoprolol tartrate  100 mg Oral BID   polyethylene glycol  17 g Oral BID   [START ON 12/13/2021] rosuvastatin  5 mg Oral Weekly    Objective: Vital signs in last 24 hours: Temp:  [97.5 F (36.4 C)-98.3 F (36.8 C)] 98.3 F (36.8 C) (12/08 0944) Pulse Rate:  [54-125] 71 (12/08 0944) Resp:  [7-19] 17 (12/08 0944) BP: (126-174)/(62-77) 170/72 (12/08 0944) SpO2:  [93 %-100 %] 97 % (12/08 0944)  Physical Exam  Constitutional:  oriented to person, place, and time. appears well-developed and well-nourished. No distress.  HENT: /AT, PERRLA, no scleral icterus Mouth/Throat: Oropharynx is clear and moist. No oropharyngeal exudate.  Cardiovascular: Normal rate, regular rhythm and normal heart sounds. Exam reveals no gallop and no friction rub.  No murmur heard.  Pulmonary/Chest: Effort normal and breath sounds normal. No respiratory distress.  has no wheezes.  Neck = supple, no nuchal rigidity Abdominal: Soft. Bowel sounds are normal.  exhibits no distension. There is no tenderness.  IOE:VOJJK knee wrapped Lymphadenopathy: no cervical adenopathy. No axillary adenopathy Neurological: alert and oriented to person, place, and time.  Skin: Skin is warm and dry. No rash noted. No erythema.  Psychiatric: a normal mood and affect.  behavior is normal.    Lab Results Recent Labs    12/11/21 0342 12/11/21 0845  WBC  --  7.8  HGB  --  10.4*  HCT  --   32.0*  NA 141  --   K 3.8  --   CL 108  --   CO2 25  --   BUN 14  --   CREATININE 0.66  --    Liver Panel No results for input(s): "PROT", "ALBUMIN", "AST", "ALT", "ALKPHOS", "BILITOT", "BILIDIR", "IBILI" in the last 72 hours. Sedimentation Rate Recent Labs    12/11/21 0342  ESRSEDRATE 17   C-Reactive Protein Recent Labs    12/11/21 0346  CRP 2.0*    Microbiology: reviewed Studies/Results: [place] EKG SITE RITE  Result Date: 12/10/2021 If Site Rite image not attached, placement could not be confirmed due to current cardiac rhythm.    PLAN: Picc line today. Continue on broad spectrum antibiotics and will narrow once culture results return. Anticipate d/c on [place] [facility] Pager: [phone]  12/11/2021, 9:57 AM

## 2021-12-11 NOTE — Progress Notes (Signed)
Physical Therapy Treatment Patient Details Name: Susan Turner MRN: [id number] DOB: 23-Jun-1945 Today's Date: 12/11/2021   History of Present Illness Pt s/p R TKR removal with spacer placement 2* infection.  Pt with hx of anemia, pre-diabetes, and R TKR in 2012    PT Comments    Pt continues very motivated and progressing well with mobility.     Recommendations for follow up therapy are one component of a multi-disciplinary discharge planning process, led by the attending physician.  Recommendations may be updated based on patient status, additional functional criteria and insurance authorization.  Follow Up Recommendations  Follow physician's recommendations for discharge plan and follow up therapies     Assistance Recommended at Discharge Frequent or constant Supervision/Assistance  Patient can return home with the following A little help with walking and/or transfers;A little help with bathing/dressing/bathroom;Assistance with cooking/housework;Assist for transportation;Help with stairs or ramp for entrance   Equipment Recommendations  Rolling walker (2 wheels)    Recommendations for Other Services       Precautions / Restrictions Precautions Precautions: Fall Restrictions Weight Bearing Restrictions: Yes RLE Weight Bearing: Partial weight bearing RLE Partial Weight Bearing Percentage or Pounds: 50     Mobility  Bed Mobility Overal bed mobility: Needs Assistance Bed Mobility: Sit to Supine     Supine to sit: Min assist Sit to supine: Min assist   General bed mobility comments: Increased time with use of bedrail and min assist to manage R LE    Transfers Overall transfer level: Needs assistance Equipment used: Rolling walker (2 wheels) Transfers: Sit to/from Stand Sit to Stand: Min assist           General transfer comment: cues for LE management and use of UEs to self assist    Ambulation/Gait Ambulation/Gait assistance: Min assist Gait Distance  (Feet): 58 Feet Assistive device: Rolling walker (2 wheels) Gait Pattern/deviations: Step-to pattern, Decreased step length - right, Decreased step length - left, Shuffle, Trunk flexed Gait velocity: decr     General Gait Details: cues for sequence, posure and position from [employer] Mobility    Modified Rankin (Stroke Patients Only)       Balance                                            Cognition Arousal/Alertness: Awake/alert Behavior During Therapy: WFL for tasks assessed/performed Overall Cognitive Status: Within Functional Limits for tasks assessed                                          Exercises Total Joint Exercises Ankle Circles/Pumps: AROM, Both, 15 reps, Supine Quad Sets: AROM, Both, 10 reps, Supine Heel Slides: AAROM, Right, 10 reps, Supine Straight Leg Raises: AAROM, Right, 10 reps, Supine    General Comments        Pertinent Vitals/Pain Pain Assessment Pain Assessment: 0-10 Pain Score: 5  Pain Location: R knee Pain Descriptors / Indicators: Aching, Sore Pain Intervention(s): Limited activity within patient's tolerance, Monitored during session, RN gave pain meds during session, Ice applied    Home Living  Prior Function            PT Goals (current goals can now be found in the care plan section) Acute Rehab PT Goals Patient Stated Goal: Regain IND PT Goal Formulation: With patient Time For Goal Achievement: 12/18/21 Potential to Achieve Goals: Good Progress towards PT goals: Progressing toward goals    Frequency    7X/week      PT Plan Current plan remains appropriate    Co-evaluation              AM-PAC PT "6 Clicks" Mobility   Outcome Measure  Help needed turning from your back to your side while in a flat bed without using bedrails?: A Little Help needed moving from lying on your back to sitting on the side of a  flat bed without using bedrails?: A Little Help needed moving to and from a bed to a chair (including a wheelchair)?: A Little Help needed standing up from a chair using your arms (e.g., wheelchair or bedside chair)?: A Little Help needed to walk in hospital room?: A Little Help needed climbing 3-5 steps with a railing? : A Lot 6 Click Score: 17    End of Session Equipment Utilized During Treatment: Gait belt;Right knee immobilizer Activity Tolerance: Patient tolerated treatment well Patient left: in bed;with bed alarm set;with nursing/sitter in room Nurse Communication: Mobility status PT Visit Diagnosis: Difficulty in walking, not elsewhere classified (R26.2)     Time: 9833-8250 PT Time Calculation (min) (ACUTE ONLY): 26 min  Charges:  $Gait Training: 23-37 mins                     [place] Pager [phone] Office [phone]    Clydell Sposito 12/11/2021, 2:26 PM

## 2021-12-11 NOTE — TOC Initial Note (Addendum)
Transition of Care [facility]) - Initial/Assessment Note   Patient Details  Name: Susan Turner MRN: [id number] Date of Birth: 10/16/1945  [facility]) CM/SW Contact:    Sherie Don, LCSW Phone Number: 12/11/2021, 12:42 PM  Clinical Narrative: Patient is expected to discharge home with [facility] and IV antibiotics. CSW met with patient to review discharge plan and needs. Patient to have PICC line placed. CSW discussed referrals to [place] and [facility] for IV antibiotics and HHRN; patient is agreeable to referrals. Patient aware [facility] will provide teaching prior to discharge once the antibiotic for discharge is determined. CSW made referral to [facility] with Ysidro Evert, who set up [facility] with Cindie from [place]. [employer] delivered rolling walker to patient's room. CSW left VM for daughter regarding discharge plan. Patient will discharge to her daughter's home: [address] [place], [place] [address].  Expected Discharge Plan: [place] Barriers to Discharge: Continued Medical Work up  Patient Goals and CMS Choice Patient states their goals for this hospitalization and ongoing recovery are:: Discharge to daughter's home in [facility].gov Compare Post Acute Care list provided to:: Patient Choice offered to / list presented to : Patient  Expected Discharge Plan and Services Expected Discharge Plan: [place] In-house Referral: Clinical Social Work Post Acute Care Choice: Home Health, Durable Medical Equipment Living arrangements for the past 2 months: Apartment             DME Arranged: Walker rolling DME Agency: Economist spoke with at DME Agency: Prearranged in orthopedist's office HH Arranged: RN, IV Antibiotics HH Agency: [facility], [place] Date [place]: 12/11/21 Representative spoke with at [place]: Jeannene Patella (Ysidro Evert), Rozelle Logan)  Prior Living Arrangements/Services Living arrangements for the past 2  months: Apartment Lives with:: Self Patient language and need for interpreter reviewed:: Yes Do you feel safe going back to the place where you live?: Yes      Need for Family Participation in Patient Care: Yes (Comment) Care giver support system in place?: Yes (comment) Criminal Activity/Legal Involvement Pertinent to Current Situation/Hospitalization: No - Comment as needed  Activities of Daily Living Home Assistive Devices/Equipment: Eyeglasses ADL Screening (condition at time of admission) Patient's cognitive ability adequate to safely complete daily activities?: Yes Is the patient deaf or have difficulty hearing?: No Does the patient have difficulty seeing, even when wearing glasses/contacts?: No Does the patient have difficulty concentrating, remembering, or making decisions?: No Patient able to express need for assistance with ADLs?: Yes Does the patient have difficulty dressing or bathing?: No Independently performs ADLs?: Yes (appropriate for developmental age) Does the patient have difficulty walking or climbing stairs?: Yes Weakness of Legs: Right Weakness of Arms/Hands: None  Permission Sought/Granted Permission sought to share information with : Other (comment) Permission granted to share information with : Yes, Verbal Permission Granted Permission granted to share info w AGENCY: [facility], [place] agencies  Emotional Assessment Appearance:: Appears stated age Attitude/Demeanor/Rapport: Engaged Affect (typically observed): Accepting Orientation: : Oriented to Self, Oriented to Place, Oriented to  Time, Oriented to Situation Alcohol / Substance Use: Not Applicable Psych Involvement: No (comment)  Admission diagnosis:  Infection of prosthetic right knee joint [facility]) [T84.53XA] Patient Active Problem List   Diagnosis Date Noted   Infection of prosthetic right knee joint ([place]) 12/10/2021   Essential hypertension 05/31/2015   Hyperglycemia 05/31/2015   Arthritis 05/31/2015    Gallstone 05/31/2015   PCP:  Maury Dus, MD Pharmacy:  [id number]: [phone]: [phone] [place]Phone: [phone]: [phone] Social Determinants of Health (SDOH) Interventions    Readmission Risk Interventions     No data to display

## 2021-12-12 LAB — GLUCOSE, CAPILLARY
Glucose-Capillary: 111 mg/dL — ABNORMAL HIGH (ref 70–99)
Glucose-Capillary: 143 mg/dL — ABNORMAL HIGH (ref 70–99)
Glucose-Capillary: 141 mg/dL — ABNORMAL HIGH (ref 70–99)
Glucose-Capillary: 143 mg/dL — ABNORMAL HIGH (ref 70–99)

## 2021-12-12 NOTE — Plan of Care (Signed)
Plan of care reviewed and discussed with the patient. 

## 2021-12-12 NOTE — Progress Notes (Signed)
Subjective: 2 Days Post-Op Procedure(s) (LRB): TOTAL KNEE REVISION (Right)  Patient reports pain as mild to moderate.  Denies fever, chills, N/V, CP, SOB.  Tolerating POs well.  Admits to flatus. Notes that her R knee and LE and swollen.  Notes that she is working well with therapy.  Objective:   VITALS:  Temp:  [98.1 F (36.7 C)-98.6 F (37 C)] 98.1 F (36.7 C) (12/09 0617) Pulse Rate:  [67-71] 67 (12/09 0617) Resp:  [17-18] 18 (12/09 0617) BP: (142-170)/(65-76) 158/65 (12/09 0617) SpO2:  [96 %-98 %] 98 % (12/09 0617)  General: WDWN patient in NAD. Psych:  Appropriate mood and affect. Neuro:  A&O x 3, Moving all extremities, sensation intact to light touch HEENT:  EOMs intact Chest:  Even non-labored respirations Skin:  Dressing/ACE bandage C/D/I, no rashes or lesions Extremities: warm/dry, moderate edema to R knee and LE, no erythema or echymosis.  No lymphadenopathy. Pulses: Popliteus 2+ MSK:  ROM: TKE, MMT: able to perform quad set, (-) Homan's    LABS Recent Labs    12/11/21 0342 12/11/21 0845  HGB 10.0* 10.4*  WBC 6.8 7.8  PLT 167 177   Recent Labs    12/11/21 0342  NA 141  K 3.8  CL 108  CO2 25  BUN 14  CREATININE 0.66  GLUCOSE 141*   No results for input(s): "LABPT", "INR" in the last 72 hours.   Assessment/Plan: 2 Days Post-Op Procedure(s) (LRB): TOTAL KNEE REVISION (Right)  Patient seen in rounds for Dr. Alvan Dame PWB 50% to R LE in KI Up with therapy Following cultures.  No growth thus far ABX per ID  Mechele Claude PA-C [facility] Office:  [phone]

## 2021-12-12 NOTE — Progress Notes (Signed)
Physical Therapy Treatment Patient Details Name: Susan Turner MRN: [id number] DOB: Jul 31, 1945 Today's Date: 12/12/2021   History of Present Illness Pt s/p R TKR removal with spacer placement 2* infection.  Pt with hx of anemia, pre-diabetes, and R TKR in 2012    PT Comments    Pt continues very motivated and progressing well with mobility including increased distance ambulated and decreased assist required for most tasks.  Pt hopeful for dc home Monday.   Recommendations for follow up therapy are one component of a multi-disciplinary discharge planning process, led by the attending physician.  Recommendations may be updated based on patient status, additional functional criteria and insurance authorization.  Follow Up Recommendations  Follow physician's recommendations for discharge plan and follow up therapies     Assistance Recommended at Discharge Frequent or constant Supervision/Assistance  Patient can return home with the following A little help with walking and/or transfers;A little help with bathing/dressing/bathroom;Assistance with cooking/housework;Assist for transportation;Help with stairs or ramp for entrance   Equipment Recommendations  Rolling walker (2 wheels)    Recommendations for Other Services       Precautions / Restrictions Precautions Precautions: Fall Restrictions Weight Bearing Restrictions: Yes RLE Weight Bearing: Partial weight bearing RLE Partial Weight Bearing Percentage or Pounds: 50%     Mobility  Bed Mobility Overal bed mobility: Needs Assistance Bed Mobility: Supine to Sit     Supine to sit: Min guard     General bed mobility comments: Increased time with use of bedrail and pt self assisting R LE with UEs    Transfers Overall transfer level: Needs assistance Equipment used: Rolling walker (2 wheels) Transfers: Sit to/from Stand Sit to Stand: Min assist, Min guard           General transfer comment: cues for LE management and  use of UEs to self assist    Ambulation/Gait Ambulation/Gait assistance: Min assist, Min guard Gait Distance (Feet): 111 Feet Assistive device: Rolling walker (2 wheels) Gait Pattern/deviations: Step-to pattern, Decreased step length - right, Decreased step length - left, Shuffle, Trunk flexed Gait velocity: decr     General Gait Details: Increased time with min cues for sequence, posure and position from [employer] Mobility    Modified Rankin (Stroke Patients Only)       Balance                                            Cognition Arousal/Alertness: Awake/alert Behavior During Therapy: WFL for tasks assessed/performed Overall Cognitive Status: Within Functional Limits for tasks assessed                                          Exercises Total Joint Exercises Ankle Circles/Pumps: AROM, Both, 15 reps, Supine Quad Sets: AROM, Both, 10 reps, Supine Heel Slides: AAROM, Right, Supine, 15 reps Straight Leg Raises: AAROM, Right, Supine, 15 reps Goniometric ROM: -8 - 45    General Comments        Pertinent Vitals/Pain Pain Assessment Pain Assessment: 0-10 Pain Score: 6  Pain Location: R knee Pain Descriptors / Indicators: Aching, Sore Pain Intervention(s): Limited activity within patient's tolerance, Monitored during session, Premedicated before session (pt declines ice)  Home Living                          Prior Function            PT Goals (current goals can now be found in the care plan section) Acute Rehab PT Goals Patient Stated Goal: Regain IND PT Goal Formulation: With patient Time For Goal Achievement: 12/18/21 Potential to Achieve Goals: Good Progress towards PT goals: Progressing toward goals    Frequency    7X/week      PT Plan Current plan remains appropriate    Co-evaluation              AM-PAC PT "6 Clicks" Mobility   Outcome Measure  Help  needed turning from your back to your side while in a flat bed without using bedrails?: A Little Help needed moving from lying on your back to sitting on the side of a flat bed without using bedrails?: A Little Help needed moving to and from a bed to a chair (including a wheelchair)?: A Little Help needed standing up from a chair using your arms (e.g., wheelchair or bedside chair)?: A Little Help needed to walk in hospital room?: A Little Help needed climbing 3-5 steps with a railing? : A Lot 6 Click Score: 17    End of Session Equipment Utilized During Treatment: Gait belt;Right knee immobilizer Activity Tolerance: Patient tolerated treatment well Patient left: in chair;with call bell/phone within reach Nurse Communication: Mobility status PT Visit Diagnosis: Difficulty in walking, not elsewhere classified (R26.2)     Time: 1856-3149 PT Time Calculation (min) (ACUTE ONLY): 35 min  Charges:  $Gait Training: 8-22 mins $Therapeutic Exercise: 8-22 mins                     Debe Coder PT Acute Rehabilitation Services Pager [phone] Office [phone]    Bridgette Wolden 12/12/2021, 8:56 AM

## 2021-12-12 NOTE — Progress Notes (Signed)
Physical Therapy Treatment Patient Details Name: Susan Turner MRN: [id number] DOB: 1945-02-17 Today's Date: 12/12/2021   History of Present Illness Pt s/p R TKR removal with spacer placement 2* infection.  Pt with hx of anemia, pre-diabetes, and R TKR in 2012    PT Comments    Pt continues in good spirits, progressing well with mobility and hopeful for return home Monday.   Recommendations for follow up therapy are one component of a multi-disciplinary discharge planning process, led by the attending physician.  Recommendations may be updated based on patient status, additional functional criteria and insurance authorization.  Follow Up Recommendations  Follow physician's recommendations for discharge plan and follow up therapies     Assistance Recommended at Discharge Frequent or constant Supervision/Assistance  Patient can return home with the following A little help with walking and/or transfers;A little help with bathing/dressing/bathroom;Assistance with cooking/housework;Assist for transportation;Help with stairs or ramp for entrance   Equipment Recommendations  Rolling walker (2 wheels)    Recommendations for Other Services       Precautions / Restrictions Precautions Precautions: Fall Restrictions Weight Bearing Restrictions: Yes RLE Weight Bearing: Partial weight bearing RLE Partial Weight Bearing Percentage or Pounds: 50%     Mobility  Bed Mobility Overal bed mobility: Needs Assistance Bed Mobility: Supine to Sit, Sit to Supine     Supine to sit: Supervision Sit to supine: Min guard   General bed mobility comments: Increased time with pt self assisting R LE with UEs off bed and gait belt back on bed    Transfers Overall transfer level: Needs assistance Equipment used: Rolling walker (2 wheels) Transfers: Sit to/from Stand Sit to Stand: Min guard           General transfer comment: cues for LE management and use of UEs to self assist     Ambulation/Gait Ambulation/Gait assistance: Min guard Gait Distance (Feet): 94 Feet Assistive device: Rolling walker (2 wheels) Gait Pattern/deviations: Step-to pattern, Decreased step length - right, Decreased step length - left, Shuffle, Trunk flexed Gait velocity: decr     General Gait Details: Increased time with min cues for sequence, posture and position from [employer]             Wheelchair Mobility    Modified Rankin (Stroke Patients Only)       Balance                                            Cognition Arousal/Alertness: Awake/alert Behavior During Therapy: WFL for tasks assessed/performed Overall Cognitive Status: Within Functional Limits for tasks assessed                                          Exercises Total Joint Exercises Ankle Circles/Pumps: AROM, Both, 15 reps, Supine Quad Sets: AROM, Both, 10 reps, Supine Heel Slides: AAROM, Right, Supine, 15 reps Straight Leg Raises: AAROM, Right, Supine, 15 reps Goniometric ROM: -8 - 45    General Comments        Pertinent Vitals/Pain Pain Assessment Pain Assessment: 0-10 Pain Score: 4  Pain Location: R knee Pain Descriptors / Indicators: Aching, Sore Pain Intervention(s): Limited activity within patient's tolerance, Monitored during session, Premedicated before session, Ice applied    Home Living  Prior Function            PT Goals (current goals can now be found in the care plan section) Acute Rehab PT Goals Patient Stated Goal: Regain IND PT Goal Formulation: With patient Time For Goal Achievement: 12/18/21 Potential to Achieve Goals: Good Progress towards PT goals: Progressing toward goals    Frequency    7X/week      PT Plan Current plan remains appropriate    Co-evaluation              AM-PAC PT "6 Clicks" Mobility   Outcome Measure  Help needed turning from your back to your side while  in a flat bed without using bedrails?: A Little Help needed moving from lying on your back to sitting on the side of a flat bed without using bedrails?: A Little Help needed moving to and from a bed to a chair (including a wheelchair)?: A Little Help needed standing up from a chair using your arms (e.g., wheelchair or bedside chair)?: A Little Help needed to walk in hospital room?: A Little Help needed climbing 3-5 steps with a railing? : A Lot 6 Click Score: 17    End of Session Equipment Utilized During Treatment: Gait belt;Right knee immobilizer Activity Tolerance: Patient tolerated treatment well Patient left: in bed;with call bell/phone within reach;with bed alarm set Nurse Communication: Mobility status PT Visit Diagnosis: Difficulty in walking, not elsewhere classified (R26.2)     Time: 3428-7681 PT Time Calculation (min) (ACUTE ONLY): 26 min  Charges:  $Gait Training: 8-22 mins                     [place] Pager [phone] Office [phone]    Jessicamarie Amiri 12/12/2021, 4:36 PM

## 2021-12-13 LAB — GLUCOSE, CAPILLARY
Glucose-Capillary: 141 mg/dL — ABNORMAL HIGH (ref 70–99)
Glucose-Capillary: 154 mg/dL — ABNORMAL HIGH (ref 70–99)
Glucose-Capillary: 141 mg/dL — ABNORMAL HIGH (ref 70–99)
Glucose-Capillary: 191 mg/dL — ABNORMAL HIGH (ref 70–99)

## 2021-12-13 MED ORDER — CALCIUM CARBONATE ANTACID 500 MG PO CHEW
1.0000 | CHEWABLE_TABLET | Freq: Two times a day (BID) | ORAL | Status: DC | PRN
  Administered 2021-12-13: 200 mg via ORAL
  Filled 2021-12-13: qty 1

## 2021-12-13 MED ORDER — PANTOPRAZOLE SODIUM 40 MG PO TBEC
40.0000 mg | DELAYED_RELEASE_TABLET | Freq: Every day | ORAL | Status: DC
  Administered 2021-12-13 – 2021-12-14 (×2): 40 mg via ORAL
  Filled 2021-12-13 (×2): qty 1

## 2021-12-13 NOTE — Plan of Care (Signed)
  Problem: Education: Goal: Knowledge of the prescribed therapeutic regimen will improve Outcome: Progressing   Problem: Activity: Goal: Ability to avoid complications of mobility impairment will improve Outcome: Progressing   Problem: Pain Management: Goal: Pain level will decrease with appropriate interventions Outcome: Progressing   Problem: Activity: Goal: Ability to avoid complications of mobility impairment will improve Outcome: Progressing

## 2021-12-13 NOTE — Progress Notes (Signed)
Patient ID: Susan Turner, female   DOB: 07/25/1945, 76 y.o.   MRN: [id number] Subjective: 3 Days Post-Op Procedure(s) (LRB): TOTAL KNEE REVISION (Right)    Patient reports pain as mild to moderate No events Appreciate ID input and assistance Cultures still pending this am  Objective:   VITALS:   Vitals:   12/13/21 0539 12/13/21 0804  BP: (!) 165/68 (!) 153/83  Pulse: 70 73  Resp: 20   Temp: 98.4 F (36.9 C)   SpO2: 98%     Neurovascular intact Incision: dressing C/D/I - right knee  LABS Recent Labs    12/11/21 0342 12/11/21 0845  HGB 10.0* 10.4*  HCT 31.8* 32.0*  WBC 6.8 7.8  PLT 167 177    Recent Labs    12/11/21 0342  NA 141  K 3.8  BUN 14  CREATININE 0.66  GLUCOSE 141*    No results for input(s): "LABPT", "INR" in the last 72 hours.   Assessment/Plan: 3 Days Post-Op Procedure(s) (LRB): TOTAL KNEE REVISION (Right)   Up with therapy Continue ABX therapy due to infected right TKR - currently broad spectrum Rocephin and Daptomycin Likely home tomorrow  Pending cultures to select antibiotics

## 2021-12-13 NOTE — Progress Notes (Signed)
Physical Therapy Treatment Patient Details Name: Susan Turner MRN: [id number] DOB: 24-Jul-1945 Today's Date: 12/13/2021   History of Present Illness Pt s/p R TKR removal with spacer placement 2* infection.  Pt with hx of anemia, pre-diabetes, and R TKR in 2012    PT Comments    Pt continues very cooperative and performed HEP with written instruction provided.  Pt requests short rest prior to attempting ambulation.  Cold packs provided and will return shortly to complete tx.   Recommendations for follow up therapy are one component of a multi-disciplinary discharge planning process, led by the attending physician.  Recommendations may be updated based on patient status, additional functional criteria and insurance authorization.  Follow Up Recommendations  Follow physician's recommendations for discharge plan and follow up therapies     Assistance Recommended at Discharge Frequent or constant Supervision/Assistance  Patient can return home with the following A little help with walking and/or transfers;A little help with bathing/dressing/bathroom;Assistance with cooking/housework;Assist for transportation;Help with stairs or ramp for entrance   Equipment Recommendations  Rolling walker (2 wheels)    Recommendations for Other Services       Precautions / Restrictions Precautions Precautions: Fall Restrictions Weight Bearing Restrictions: Yes RLE Weight Bearing: Partial weight bearing RLE Partial Weight Bearing Percentage or Pounds: 50     Mobility  Bed Mobility                    Transfers                        Ambulation/Gait                   Stairs             Wheelchair Mobility    Modified Rankin (Stroke Patients Only)       Balance                                            Cognition Arousal/Alertness: Awake/alert Behavior During Therapy: WFL for tasks assessed/performed Overall Cognitive Status:  Within Functional Limits for tasks assessed                                          Exercises Total Joint Exercises Ankle Circles/Pumps: AROM, Both, 15 reps, Supine Quad Sets: AROM, Both, 10 reps, Supine Heel Slides: AAROM, Right, Supine, 15 reps Straight Leg Raises: AAROM, Right, Supine, 15 reps Goniometric ROM: -8 - 35    General Comments        Pertinent Vitals/Pain Pain Assessment Pain Assessment: 0-10 Pain Score: 7  Pain Location: R knee Pain Descriptors / Indicators: Aching, Sore Pain Intervention(s): Limited activity within patient's tolerance, Monitored during session, Premedicated before session, Ice applied    Home Living                          Prior Function            PT Goals (current goals can now be found in the care plan section) Acute Rehab PT Goals Patient Stated Goal: Regain IND PT Goal Formulation: With patient Time For Goal Achievement: 12/18/21 Potential to Achieve Goals: Good Progress towards PT goals: Progressing toward goals  Frequency    7X/week      PT Plan Current plan remains appropriate    Co-evaluation              AM-PAC PT "6 Clicks" Mobility   Outcome Measure  Help needed turning from your back to your side while in a flat bed without using bedrails?: A Little Help needed moving from lying on your back to sitting on the side of a flat bed without using bedrails?: A Little Help needed moving to and from a bed to a chair (including a wheelchair)?: A Little Help needed standing up from a chair using your arms (e.g., wheelchair or bedside chair)?: A Little Help needed to walk in hospital room?: A Little Help needed climbing 3-5 steps with a railing? : A Lot 6 Click Score: 17    End of Session Equipment Utilized During Treatment: Gait belt;Right knee immobilizer Activity Tolerance: Patient tolerated treatment well Patient left: in bed;with call bell/phone within reach;with bed alarm  set Nurse Communication: Mobility status PT Visit Diagnosis: Difficulty in walking, not elsewhere classified (R26.2)     Time: 6962-9528 PT Time Calculation (min) (ACUTE ONLY): 22 min  Charges:  $Therapeutic Exercise: 8-22 mins                     Debe Coder PT Acute Rehabilitation Services Pager [phone] Office [phone]    Shloma Roggenkamp 12/13/2021, 12:55 PM

## 2021-12-13 NOTE — Progress Notes (Signed)
Physical Therapy Treatment Patient Details Name: Susan Turner MRN: [id number] DOB: [birth year] Today's Date: 12/13/2021   History of Present Illness Pt s/p R TKR removal with spacer placement 2* infection.  Pt with hx of anemia, pre-diabetes, and R TKR in 2012    PT Comments    Pt continues cooperative and up to ambulate in halls but reports increased pain this am despite premed.  Pt returned to room with cold packs provided.  Pt hopeful for dc home tomorrow.   Recommendations for follow up therapy are one component of a multi-disciplinary discharge planning process, led by the attending physician.  Recommendations may be updated based on patient status, additional functional criteria and insurance authorization.  Follow Up Recommendations  Follow physician's recommendations for discharge plan and follow up therapies     Assistance Recommended at Discharge Frequent or constant Supervision/Assistance  Patient can return home with the following A little help with walking and/or transfers;A little help with bathing/dressing/bathroom;Assistance with cooking/housework;Assist for transportation;Help with stairs or ramp for entrance   Equipment Recommendations  Rolling walker (2 wheels)    Recommendations for Other Services       Precautions / Restrictions Precautions Precautions: Fall Restrictions Weight Bearing Restrictions: Yes RLE Weight Bearing: Partial weight bearing RLE Partial Weight Bearing Percentage or Pounds: 50     Mobility  Bed Mobility Overal bed mobility: Needs Assistance Bed Mobility: Supine to Sit     Supine to sit: Supervision     General bed mobility comments: Increased time with pt self assisting R LE with UEs off bed and gait belt back on bed    Transfers Overall transfer level: Needs assistance Equipment used: Rolling walker (2 wheels) Transfers: Sit to/from Stand Sit to Stand: Min guard, Supervision           General transfer comment: cues  for LE management and use of UEs to self assist    Ambulation/Gait Ambulation/Gait assistance: Min guard, Supervision Gait Distance (Feet): 100 Feet Assistive device: Rolling walker (2 wheels) Gait Pattern/deviations: Step-to pattern, Decreased step length - right, Decreased step length - left, Shuffle, Trunk flexed Gait velocity: decr     General Gait Details: Increased time with min cues for sequence, posture and position from [employer]             Wheelchair Mobility    Modified Rankin (Stroke Patients Only)       Balance Overall balance assessment: Mild deficits observed, not formally tested                                          Cognition Arousal/Alertness: Awake/alert Behavior During Therapy: WFL for tasks assessed/performed Overall Cognitive Status: Within Functional Limits for tasks assessed                                          Exercises Total Joint Exercises Ankle Circles/Pumps: AROM, Both, 15 reps, Supine Quad Sets: AROM, Both, 10 reps, Supine Heel Slides: AAROM, Right, Supine, 15 reps Straight Leg Raises: AAROM, Right, Supine, 15 reps Goniometric ROM: -8 - 35    General Comments        Pertinent Vitals/Pain Pain Assessment Pain Assessment: 0-10 Pain Score: 7  Pain Location: R knee Pain Descriptors / Indicators: Aching, Sore Pain Intervention(s):  Limited activity within patient's tolerance, Monitored during session, Premedicated before session, Ice applied    Home Living                          Prior Function            PT Goals (current goals can now be found in the care plan section) Acute Rehab PT Goals Patient Stated Goal: Regain IND PT Goal Formulation: With patient Time For Goal Achievement: 12/18/21 Potential to Achieve Goals: Good Progress towards PT goals: Progressing toward goals    Frequency    7X/week      PT Plan Current plan remains appropriate     Co-evaluation              AM-PAC PT "6 Clicks" Mobility   Outcome Measure  Help needed turning from your back to your side while in a flat bed without using bedrails?: A Little Help needed moving from lying on your back to sitting on the side of a flat bed without using bedrails?: A Little Help needed moving to and from a bed to a chair (including a wheelchair)?: A Little Help needed standing up from a chair using your arms (e.g., wheelchair or bedside chair)?: A Little Help needed to walk in hospital room?: A Little Help needed climbing 3-5 steps with a railing? : A Lot 6 Click Score: 17    End of Session Equipment Utilized During Treatment: Gait belt;Right knee immobilizer Activity Tolerance: Patient tolerated treatment well Patient left: in chair;with call bell/phone within reach;with chair alarm set Nurse Communication: Mobility status PT Visit Diagnosis: Difficulty in walking, not elsewhere classified (R26.2)     Time: 8088-1103 PT Time Calculation (min) (ACUTE ONLY): 22 min  Charges:  $Gait Training: 8-22 mins $Therapeutic Exercise: 8-22 mins                     Debe Coder PT Acute Rehabilitation Services Pager [phone] Office [phone]    Javian Nudd 12/13/2021, 12:59 PM

## 2021-12-14 DIAGNOSIS — T8450XA Infection and inflammatory reaction due to unspecified internal joint prosthesis, initial encounter: Secondary | ICD-10-CM

## 2021-12-14 DIAGNOSIS — T8453XD Infection and inflammatory reaction due to internal right knee prosthesis, subsequent encounter: Secondary | ICD-10-CM | POA: Diagnosis not present

## 2021-12-14 LAB — GLUCOSE, CAPILLARY
Glucose-Capillary: 164 mg/dL — ABNORMAL HIGH (ref 70–99)
Glucose-Capillary: 148 mg/dL — ABNORMAL HIGH (ref 70–99)

## 2021-12-14 MED ORDER — SODIUM CHLORIDE 0.9 % IV SOLN
2.0000 g | INTRAVENOUS | Status: DC
  Administered 2021-12-14: 2 g via INTRAVENOUS
  Filled 2021-12-14: qty 20

## 2021-12-14 MED ORDER — HYDROCODONE-ACETAMINOPHEN 7.5-325 MG PO TABS: 1.0000 | ORAL_TABLET | ORAL | 0 refills | Status: DC | PRN

## 2021-12-14 MED ORDER — CEFTRIAXONE IV (FOR PTA / DISCHARGE USE ONLY): 2.0000 g | INTRAVENOUS | 0 refills | Status: AC

## 2021-12-14 MED ORDER — DAPTOMYCIN IV (FOR PTA / DISCHARGE USE ONLY): 700.0000 mg | INTRAVENOUS | 0 refills | Status: AC

## 2021-12-14 MED ORDER — POLYETHYLENE GLYCOL 3350 17 G PO PACK: 17.0000 g | PACK | Freq: Two times a day (BID) | ORAL | 0 refills | Status: DC

## 2021-12-14 MED ORDER — METHOCARBAMOL 500 MG PO TABS: 500.0000 mg | ORAL_TABLET | Freq: Four times a day (QID) | ORAL | 2 refills | Status: DC | PRN

## 2021-12-14 MED ORDER — SODIUM CHLORIDE 0.9 % IV SOLN
8.0000 mg/kg | Freq: Every day | INTRAVENOUS | Status: DC
  Administered 2021-12-14: 700 mg via INTRAVENOUS
  Filled 2021-12-14: qty 14

## 2021-12-14 MED ORDER — ASPIRIN 81 MG PO CHEW: 81.0000 mg | CHEWABLE_TABLET | Freq: Two times a day (BID) | ORAL | 0 refills | Status: AC

## 2021-12-14 NOTE — Progress Notes (Signed)
Patient ID: Susan Turner, female   DOB: 1945/07/03, 76 y.o.   MRN: [id number] Subjective: 4 Days Post-Op Procedure(s) (LRB): TOTAL KNEE REVISION (Right)    Patient reports pain as moderate as to be expected. She didn't feel well yesterday  Cultures still pending - remains on empiric Dapto and Rocephin  Objective:   VITALS:   Vitals:   12/13/21 2135 12/14/21 0638  BP: (!) 166/75 (!) 166/82  Pulse: 72 83  Resp: 18 18  Temp: 98 F (36.7 C) 98.1 F (36.7 C)  SpO2: 98% 97%    Neurovascular intact Incision: dressing C/D/I  LABS Recent Labs    12/11/21 0845  HGB 10.4*  HCT 32.0*  WBC 7.8  PLT 177    No results for input(s): "NA", "K", "BUN", "CREATININE", "GLUCOSE" in the last 72 hours.  No results for input(s): "LABPT", "INR" in the last 72 hours.   Assessment/Plan: 4 Days Post-Op Procedure(s) (LRB): TOTAL KNEE REVISION (Right)   Up with therapy Plan will be for discharge to home today  Needs [facility] for IV antibiotics for 6 weeks Antibiotic selection per ID - thanks RTC in 2 weeks Rx to be sent

## 2021-12-14 NOTE — Plan of Care (Signed)
Patient discharging home via private vehicle with friend Exie Parody. Discharge instructions provided earlier in the morning to daughter [place] and son-in-law when they were at bedside. PICC line in place for patient to receive IV antbx at home; verified that [place] coordinating. Ivan Anchors, RN 12/14/21 1:28 PM

## 2021-12-14 NOTE — Plan of Care (Signed)
Problem: Clinical Measurements: Goal: Postoperative complications will be avoided or minimized Outcome: Progressing   Problem: Pain Management: Goal: Pain level will decrease with appropriate interventions Outcome: Progressing   Problem: Safety: Goal: Ability to remain free from injury will improve Outcome: [place], RN 12/14/21 11:26 AM

## 2021-12-14 NOTE — Progress Notes (Addendum)
[place] for Infectious Disease    Date of Admission:  12/10/2021      ID: Susan Turner is a 76 y.o. female with  PJI of right knee Principal Problem:   Infection of prosthetic right knee joint ([place])    Subjective: Afebrile still some pain to right knee. Looking forward to going home. No difficulty with right arm picc line  Medications:   amLODipine  5 mg Oral BID   aspirin  81 mg Oral BID   Chlorhexidine Gluconate Cloth  6 each Topical Daily   docusate sodium  100 mg Oral BID   gabapentin  300 mg Oral TID   insulin aspart  0-15 Units Subcutaneous TID WC   irbesartan  300 mg Oral Daily   metoprolol tartrate  100 mg Oral BID   pantoprazole  40 mg Oral Daily   polyethylene glycol  17 g Oral BID   rosuvastatin  5 mg Oral Weekly   sodium chloride flush  10-40 mL Intracatheter Q12H    Objective: Vital signs in last 24 hours: Temp:  [98 F (36.7 C)-98.1 F (36.7 C)] 98.1 F (36.7 C) (12/11 6144) Pulse Rate:  [72-83] 83 (12/11 0638) Resp:  [18] 18 (12/11 3154) BP: (166)/(75-82) 166/82 (12/11 0086) SpO2:  [97 %-98 %] 97 % (12/11 7619)  Physical Exam  Constitutional:  oriented to person, place, and time. appears well-developed and well-nourished. No distress.  HENT: [place]/AT, PERRLA, no scleral icterus Mouth/Throat: Oropharynx is clear and moist. No oropharyngeal exudate.  Cardiovascular: Normal rate, regular rhythm and normal heart sounds. Exam reveals no gallop and no friction rub.  No murmur heard.  Pulmonary/Chest: Effort normal and breath sounds normal. No respiratory distress.  has no wheezes.  Neck = supple, no nuchal rigidity Abdominal: Soft. Bowel sounds are normal.  exhibits no distension. There is no tenderness.  JKD:TOIZT knee bandaged. No breakthrough bleeding Lymphadenopathy: no cervical adenopathy. No axillary adenopathy Neurological: alert and oriented to person, place, and time.  Skin: Skin is warm and dry. No rash noted. No erythema.   Psychiatric: a normal mood and affect.  behavior is normal.    Lab Results Lab Results  Component Value Date   ESRSEDRATE 17 12/11/2021     Microbiology: Culture negative Studies/Results: No results found.   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 S/p explant of HW and articulating spacer placed on 12/07. Started on broad spectrum abtx on 12/07.   Will plan to treat with 6 wk of ceftriaxone 2gm IV daily plus daptomycin. See opat by jeremy frens.   We will arrange for follow up in ID clinic in 5 wk.  ------------------------------- Diagnosis: Culture negative pji of knee   Allergies  Allergen Reactions   Lisinopril Cough   Codeine Other (See Comments)    Tachycardia    Motrin [Ibuprofen]     Pt does not remember reaction     OPAT Orders Discharge antibiotics to be given via PICC line Discharge antibiotics: Per pharmacy protocol  ceftriaxone 2gm iv daily plus daptomycin Aim for Vancomycin trough 15-20 or AUC 400-550 (unless otherwise indicated) Duration:  6 wk End Date: Jan 17th 2024   [place] Per Protocol:  Home health RN for IV administration and teaching; PICC line care and labs.    Labs weekly while on IV antibiotics: _x_ CBC with differential __x BMP _x_ CRP _x_ ESR _x_ CK  _x_ Please pull PIC at completion of IV antibiotics  Fax weekly  labs to [phone]  Clinic Follow Up Appt: 5 wk   @ [place] for Infectious Diseases Pager: [phone]  12/14/2021, 2:25 PM  Elzie Rings. [place] for Infectious Diseases [phone]

## 2021-12-14 NOTE — Progress Notes (Signed)
PHARMACY CONSULT NOTE FOR:  OUTPATIENT  PARENTERAL ANTIBIOTIC THERAPY (OPAT)  Indication: Infected TKR Regimen: daptomycin '700mg'$  IV daily and ceftriaxone 2g IV daily End date: 01/20/2022  IV antibiotic discharge orders are pended. To discharging provider:  please sign these orders via discharge navigator,  Select New Orders & click on the button choice - Manage This Unsigned Work.     Thank you for allowing pharmacy to be a part of this patient's care.  Candie Mile 12/14/2021, 10:09 AM

## 2021-12-14 NOTE — TOC Transition Note (Signed)
Transition of Care [facility]) - CM/SW Discharge Note  Patient Details  Name: Susan Turner MRN: [id number] Date of Birth: 04/20/1945  [facility]) CM/SW Contact:  Sherie Don, LCSW Phone Number: 12/14/2021, 10:25 AM  Clinical Narrative: Jeannene Patella with [facility] to complete teaching for IV antibiotics today. Patient will discharge to daughter's home today with [facility] through [place]. Patient will not receive OPPT or HHPT at this time. TOC signing off.  Final next level of care: [place] Barriers to Discharge: Barriers Resolved  Patient Goals and CMS Choice Patient states their goals for this hospitalization and ongoing recovery are:: Discharge to daughter's home in [facility].gov Compare Post Acute Care list provided to:: Patient Choice offered to / list presented to : Patient  Discharge Plan and Services In-house Referral: Clinical Social Work Post Acute Care Choice: Home Health, Durable Medical Equipment          DME Arranged: Walker rolling DME Agency: [employer] Representative spoke with at DME Agency: Prearranged in orthopedist's office HH Arranged: RN, IV Antibiotics HH Agency: [facility], [place] Date [place]: 12/11/21 Representative spoke with at [place]: Jeannene Patella (Ysidro Evert), Cindie Alvis [place])  Social Determinants of Health (SDOH) Interventions    Readmission Risk Interventions     No data to display

## 2021-12-14 NOTE — Progress Notes (Signed)
Patient will be discharging home today with daughter and son-in-law. Address is [address] [place], [place] [address]. Updated Carolynn Sayers with [facility] infusion regarding location of disposition.   Verified with Tobi Bastos SW that patient not to have [place] or OPPT at discharge. Family updated with this information.  Ivan Anchors, RN 12/14/21 10:46 AM

## 2021-12-14 NOTE — Progress Notes (Signed)
Physical Therapy Treatment Patient Details Name: Susan Turner MRN: [id number] DOB: September 21, 1945 Today's Date: 12/14/2021   History of Present Illness Pt s/p R TKR removal with spacer placement 2* infection.  Pt with hx of anemia, pre-diabetes, and R TKR in 2012    PT Comments    Pt progressing well, demonstrates carryover  from previous sessions, tolerating acceptable gait distance, good stability and adherence to [facility].  Motivated to d/c home; reports she is going to her dtr's home with no steps to enter.   Recommendations for follow up therapy are one component of a multi-disciplinary discharge planning process, led by the attending physician.  Recommendations may be updated based on patient status, additional functional criteria and insurance authorization.  Follow Up Recommendations  Follow physician's recommendations for discharge plan and follow up therapies     Assistance Recommended at Discharge Frequent or constant Supervision/Assistance  Patient can return home with the following A little help with walking and/or transfers;A little help with bathing/dressing/bathroom;Assistance with cooking/housework;Assist for transportation;Help with stairs or ramp for entrance   Equipment Recommendations  Rolling walker (2 wheels)    Recommendations for Other Services       Precautions / Restrictions Precautions Precautions: Fall Restrictions Weight Bearing Restrictions: Yes RLE Weight Bearing: Partial weight bearing RLE Partial Weight Bearing Percentage or Pounds: 50     Mobility  Bed Mobility               General bed mobility comments:  (in recliner)    Transfers Overall transfer level: Needs assistance Equipment used: Rolling walker (2 wheels) Transfers: Sit to/from Stand Sit to Stand: Supervision           General transfer comment: cues for  UEs to self assist    Ambulation/Gait Ambulation/Gait assistance: Supervision Gait Distance (Feet): 100  Feet Assistive device: Rolling walker (2 wheels) Gait Pattern/deviations: Step-to pattern, Decreased step length - right, Decreased step length - left, Trunk flexed Gait velocity: decr     General Gait Details: Increased time with intermittent cues for sequence, PWB, posture and position from [employer]             Wheelchair Mobility    Modified Rankin (Stroke Patients Only)       Balance Overall balance assessment: Mild deficits observed, not formally tested                                          Cognition Arousal/Alertness: Awake/alert Behavior During Therapy: WFL for tasks assessed/performed Overall Cognitive Status: Within Functional Limits for tasks assessed                                          Exercises      General Comments        Pertinent Vitals/Pain Pain Assessment Pain Assessment: 0-10 Pain Score: 4  Pain Location: R knee Pain Descriptors / Indicators: Aching, Sore Pain Intervention(s): Limited activity within patient's tolerance, Monitored during session, Premedicated before session    Home Living                          Prior Function            PT Goals (current goals can now be found  in the care plan section) Acute Rehab PT Goals Patient Stated Goal: Regain IND PT Goal Formulation: With patient Time For Goal Achievement: 12/18/21 Potential to Achieve Goals: Good Progress towards PT goals: Progressing toward goals    Frequency    7X/week      PT Plan Current plan remains appropriate    Co-evaluation              AM-PAC PT "6 Clicks" Mobility   Outcome Measure  Help needed turning from your back to your side while in a flat bed without using bedrails?: A Little Help needed moving from lying on your back to sitting on the side of a flat bed without using bedrails?: A Little Help needed moving to and from a bed to a chair (including a wheelchair)?: A Little Help  needed standing up from a chair using your arms (e.g., wheelchair or bedside chair)?: A Little Help needed to walk in hospital room?: A Little Help needed climbing 3-5 steps with a railing? : A Lot 6 Click Score: 17    End of Session Equipment Utilized During Treatment: Gait belt;Right knee immobilizer Activity Tolerance: Patient tolerated treatment well Patient left: in chair;with call bell/phone within reach;with chair alarm set;with family/visitor present Nurse Communication: Mobility status PT Visit Diagnosis: Difficulty in walking, not elsewhere classified (R26.2)     Time: 1130-1145 PT Time Calculation (min) (ACUTE ONLY): 15 min  Charges:  $Gait Training: 8-22 mins                     Baxter Flattery, PT  Acute Rehab Dept [facility]) [phone]  [facility] Weekend Pager (Saturday/Sunday only)  [phone]  12/14/2021    [facility] 12/14/2021, 12:19 PM

## 2021-12-15 DIAGNOSIS — M1711 Unilateral primary osteoarthritis, right knee: Secondary | ICD-10-CM | POA: Diagnosis not present

## 2021-12-15 DIAGNOSIS — M009 Pyogenic arthritis, unspecified: Secondary | ICD-10-CM | POA: Diagnosis not present

## 2021-12-17 LAB — AEROBIC/ANAEROBIC CULTURE W GRAM STAIN (SURGICAL/DEEP WOUND)

## 2021-12-22 DIAGNOSIS — T8453XA Infection and inflammatory reaction due to internal right knee prosthesis, initial encounter: Secondary | ICD-10-CM | POA: Diagnosis not present

## 2021-12-22 DIAGNOSIS — Z792 Long term (current) use of antibiotics: Secondary | ICD-10-CM | POA: Diagnosis not present

## 2021-12-22 NOTE — Discharge Summary (Signed)
Physician Discharge Summary   Patient ID: SATIVA GELLES MRN: [id number] DOB/AGE: 04/12/1945 76 y.o.  Admit date: 12/10/2021 Discharge date: 12/14/2021  Primary Diagnosis: Prosthetic joint infection, right total knee   Admission Diagnoses:  Past Medical History:  Diagnosis Date   Anemia    Arthritis    Gout    Hyperlipidemia    Hypertension    Iron deficiency anemia    Lipoma of back    Pneumonia    Pre-diabetes    URI (upper respiratory infection)    Weight loss    Discharge Diagnoses:   Principal Problem:   Infection of prosthetic right knee joint ([place]) Active Problems:   Prosthetic joint infection, initial encounter ([place])  Estimated body mass index is 33.02 kg/m as calculated from the following:   Height as of this encounter: _0  (1.651 m).   Weight as of this encounter: 90 kg.  Procedure:  Procedure(s) (LRB): TOTAL KNEE REVISION (Right)   Consults: ID  HPI: The patient is a 76 year old female with history of right total knee arthroplasty from about 2012.  She presented to the office at our urgent care after a fall.  At the time, serial radiographs had not been obtained for a  while.  It appeared that perhaps after the fall there were some mechanical based loosening of her femoral component.  This was confirmed through further studies.  Based on her presentation and the loosening it felt that this was a traumatic incident and  not related to any chronic issues like infection.  For that reason, I did not perform sedimentation rate and C-reactive protein.  We initially discussed with her that we would be planning on revising her knee and plan accordingly.  Standard risks were  reviewed prior to surgery including the effort that was required to maximize her function return after surgery.  Consent was obtained in the office for benefit of pain control and management of the findings radiographically.    Laboratory Data: Admission on 12/10/2021, Discharged on  12/14/2021  Component Date Value Ref Range Status   SURGICAL PATHOLOGY 12/10/2021    Final-Edited                   Value:SURGICAL PATHOLOGY CASE: [id number] PATIENT: Everitt Amber Surgical Pathology Report     Clinical History: Failed right total knee arthroplasty ([facility])     FINAL MICROSCOPIC DIAGNOSIS:  A. RIGHT KNEE, SOFT TISSUE, EXCISION: Acute and chronic and fibrinous synovitis with a polypoid fragment of granulation tissue Fragments of a reactive trabecular bone with marrow fibrosis and chronic inflammation   GROSS DESCRIPTION:  The specimen is received fresh and consists of a 7.0 x 5.5 x 2.3 cm aggregate of tan-pink softened tissue.  Sectioning reveals tan-pink fibrous cut surface, with a 1.0 cm red-purple polypoid structure identified.  Small amount of tan-pink bone is present.  Representative sections are submitted in 2 cassettes. 1 = soft tissue 2 = bone, following decalcification Craig Staggers 12/10/2021)    Final Diagnosis performed by Tobin Chad, MD.   Electronically signed 12/11/2021 Technical component performed at [facility], [place]                          [address], [place], [place] [address].  Professional component performed at [employer], [place] [address], [place], [place] [address].  Immunohistochemistry Technical component (if applicable) was performed at [facility]. [address], [place],  [place] [address].   IMMUNOHISTOCHEMISTRY DISCLAIMER (if applicable): Some of these immunohistochemical stains may have been developed and the performance characteristics determine by [facility]. Some may not have been cleared or approved by the U.S. Food and Drug Administration. The FDA has determined that such clearance or approval is not necessary. This test is used for clinical purposes. It should not be regarded as investigational or for research. This laboratory  is certified under the [place] (CLIA-88) as qualified to perform high complexity clinical laboratory testing.  The controls stained appropriately.    Specimen Description 12/10/2021    Final                   Value:SYNOVIAL RT KNEE Performed at [facility], [place] [address] [place], [place] [address]    Special Requests 12/10/2021    Final                   Value:NONE Performed at [facility], [place] [address], [place], [place] [address]    Gram Stain 12/10/2021    Final                   Value:ABUNDANT WBC PRESENT, PREDOMINANTLY PMN NO ORGANISMS SEEN Gram Stain Report Called to,Read Back By and Verified With: MCNEIL,C. RN _0  ON 12.7.2023 BY [facility] Performed at [facility], [place] [address], [place], [place] [address]    Culture 12/10/2021    Final                   Value:RARE STAPHYLOCOCCUS EPIDERMIDIS NO ANAEROBES ISOLATED Performed at [facility], [place] [address], [place], [place] [address]    Report Status 12/10/2021 12/17/2021 FINAL   Final   Organism ID, Bacteria 12/10/2021 STAPHYLOCOCCUS EPIDERMIDIS   Final   Glucose-Capillary 12/10/2021 212 (H)  70 - 99 mg/dL Final   Glucose reference range applies only to samples taken after fasting for at least 8 hours.   Sodium 12/11/2021 141  135 - 145 mmol/L Final   Potassium 12/11/2021 3.8  3.5 - 5.1 mmol/L Final   Chloride 12/11/2021 108  98 - 111 mmol/L Final   CO2 12/11/2021 25  22 - 32 mmol/L Final   Glucose, Bld 12/11/2021 141 (H)  70 - 99 mg/dL Final   Glucose reference range applies only to samples taken after fasting for at least 8 hours.   BUN 12/11/2021 14  8 - 23 mg/dL Final   Creatinine, Ser 12/11/2021 0.66  0.44 - 1.00 mg/dL Final   Calcium 12/11/2021 8.3 (L)  8.9 - 10.3 mg/dL Final   GFR, Estimated 12/11/2021 >60  >60 mL/min Final   Comment: (NOTE) Calculated using the CKD-EPI Creatinine Equation (2021)     Anion gap 12/11/2021 8  5 - 15 Final   Performed at [facility], [place] [address], [place], [place] [address]   Total CK 12/11/2021 185  38 - 234 U/L Final   Performed at [facility], [place] [address], [place], [place] [address]   Sed Rate 12/11/2021 17  0 - 22 mm/hr Final   Performed at [facility], [place] [address], [place], [place] [address]   CRP 12/11/2021 2.0 (H)  <1.0 mg/dL Final   Performed at [place] [address], [place], [place] [address]   WBC 12/11/2021 6.8  4.0 - 10.5 K/uL Final   RBC 12/11/2021 3.90  3.87 - 5.11 MIL/uL Final   Hemoglobin 12/11/2021 10.0 (L)  12.0 - 15.0 g/dL Final   HCT 12/11/2021 31.8 (L)  36.0 - 46.0 % Final   MCV 12/11/2021 81.5  80.0 - 100.0 fL Final   MCH 12/11/2021 25.6 (L)  26.0 - 34.0 pg Final   MCHC 12/11/2021 31.4  30.0 - 36.0 g/dL Final   RDW 12/11/2021 16.4 (H)  11.5 - 15.5 % Final   Platelets 12/11/2021 167  150 - 400 K/uL Final   nRBC 12/11/2021 0.0  0.0 - 0.2 % Final   Neutrophils Relative % 12/11/2021 79  % Final   Neutro Abs 12/11/2021 5.3  1.7 - 7.7 K/uL Final   Lymphocytes Relative 12/11/2021 12  % Final   Lymphs Abs 12/11/2021 0.8  0.7 - 4.0 K/uL Final   Monocytes Relative 12/11/2021 9  % Final   Monocytes Absolute 12/11/2021 0.6  0.1 - 1.0 K/uL Final   Eosinophils Relative 12/11/2021 0  % Final   Eosinophils Absolute 12/11/2021 0.0  0.0 - 0.5 K/uL Final   Basophils Relative 12/11/2021 0  % Final   Basophils Absolute 12/11/2021 0.0  0.0 - 0.1 K/uL Final   Immature Granulocytes 12/11/2021 0  % Final   Abs Immature Granulocytes 12/11/2021 0.03  0.00 - 0.07 K/uL Final   Performed at [facility], [place] [address] [place], [place] [address]   Glucose-Capillary 12/10/2021 181 (H)  70 - 99 mg/dL Final   Glucose reference range applies only to samples taken after fasting for at least 8 hours.   Glucose-Capillary 12/11/2021 116 (H)  70 - 99 mg/dL Final    Glucose reference range applies only to samples taken after fasting for at least 8 hours.   WBC 12/11/2021 7.8  4.0 - 10.5 K/uL Final   RBC 12/11/2021 3.99  3.87 - 5.11 MIL/uL Final   Hemoglobin 12/11/2021 10.4 (L)  12.0 - 15.0 g/dL Final   HCT 12/11/2021 32.0 (L)  36.0 - 46.0 % Final   MCV 12/11/2021 80.2  80.0 - 100.0 fL Final   MCH 12/11/2021 26.1  26.0 - 34.0 pg Final   MCHC 12/11/2021 32.5  30.0 - 36.0 g/dL Final   RDW 12/11/2021 16.3 (H)  11.5 - 15.5 % Final   Platelets 12/11/2021 177  150 - 400 K/uL Final   nRBC 12/11/2021 0.0  0.0 - 0.2 % Final   Performed at [facility], [place] [address] [place],  [address]   Glucose-Capillary 12/11/2021 149 (H)  70 - 99 mg/dL Final   Glucose reference range applies only to samples taken after fasting for at least 8 hours.   Glucose-Capillary 12/11/2021 176 (H)  70 - 99 mg/dL Final   Glucose reference range applies only to samples taken after fasting for at least 8 hours.   Glucose-Capillary 12/11/2021 166 (H)  70 - 99 mg/dL Final   Glucose reference range applies only to samples taken after fasting for at least 8 hours.   Glucose-Capillary 12/12/2021 111 (H)  70 - 99 mg/dL Final   Glucose reference range applies only to samples taken after fasting for at least 8 hours.   Glucose-Capillary 12/12/2021 143 (H)  70 - 99 mg/dL Final   Glucose reference range applies only to samples taken after fasting for at least 8 hours.   Glucose-Capillary 12/12/2021 141 (H)  70 - 99 mg/dL Final   Glucose reference range applies only to samples taken after fasting for at least 8  hours.   Glucose-Capillary 12/12/2021 143 (H)  70 - 99 mg/dL Final   Glucose reference range applies only to samples taken after fasting for at least 8 hours.   Glucose-Capillary 12/13/2021 141 (H)  70 - 99 mg/dL Final   Glucose reference range applies only to samples taken after fasting for at least 8 hours.   Glucose-Capillary 12/13/2021 154 (H)  70 - 99 mg/dL  Final   Glucose reference range applies only to samples taken after fasting for at least 8 hours.   Glucose-Capillary 12/13/2021 141 (H)  70 - 99 mg/dL Final   Glucose reference range applies only to samples taken after fasting for at least 8 hours.   Glucose-Capillary 12/13/2021 191 (H)  70 - 99 mg/dL Final   Glucose reference range applies only to samples taken after fasting for at least 8 hours.   Glucose-Capillary 12/14/2021 164 (H)  70 - 99 mg/dL Final   Glucose reference range applies only to samples taken after fasting for at least 8 hours.   Glucose-Capillary 12/14/2021 148 (H)  70 - 99 mg/dL Final   Glucose reference range applies only to samples taken after fasting for at least 8 hours.  Hospital Outpatient Visit on 12/03/2021  Component Date Value Ref Range Status   MRSA, PCR 12/03/2021 NEGATIVE  NEGATIVE Final   Staphylococcus aureus 12/03/2021 POSITIVE (A)  NEGATIVE Final   Comment: (NOTE) The Xpert SA Assay (FDA approved for NASAL specimens in patients 68 years of age and older), is one component of a comprehensive surveillance program. It is not intended to diagnose infection nor to guide or monitor treatment. Performed at [facility], [place] [address], [place], [place] [address]    Hgb A1c MFr Bld 12/03/2021 6.1 (H)  4.8 - 5.6 % Final   Comment: (NOTE)         Prediabetes: 5.7 - 6.4         Diabetes: >6.4         Glycemic control for adults with diabetes: <7.0    Mean Plasma Glucose 12/03/2021 128  mg/dL Final   Comment: (NOTE) Performed At: [facility] [place], [place] [id number] Rush Farmer MD [id number]    Sodium 12/03/2021 142  135 - 145 mmol/L Final   Potassium 12/03/2021 4.2  3.5 - 5.1 mmol/L Final   Chloride 12/03/2021 107  98 - 111 mmol/L Final   CO2 12/03/2021 28  22 - 32 mmol/L Final   Glucose, Bld 12/03/2021 115 (H)  70 - 99 mg/dL Final   Glucose reference range applies only to samples taken after  fasting for at least 8 hours.   BUN 12/03/2021 19  8 - 23 mg/dL Final   Creatinine, Ser 12/03/2021 0.69  0.44 - 1.00 mg/dL Final   Calcium 12/03/2021 9.6  8.9 - 10.3 mg/dL Final   GFR, Estimated 12/03/2021 >60  >60 mL/min Final   Comment: (NOTE) Calculated using the CKD-EPI Creatinine Equation (2021)    Anion gap 12/03/2021 7  5 - 15 Final   Performed at [facility], [place] [address] [place], [place] [address]   WBC 12/03/2021 5.2  4.0 - 10.5 K/uL Final   RBC 12/03/2021 4.71  3.87 - 5.11 MIL/uL Final   Hemoglobin 12/03/2021 11.9 (L)  12.0 - 15.0 g/dL Final   HCT 12/03/2021 38.2  36.0 - 46.0 % Final   MCV 12/03/2021 81.1  80.0 - 100.0 fL Final   MCH 12/03/2021 25.3 (L)  26.0 - 34.0 pg Final   MCHC 12/03/2021 31.2  30.0 - 36.0 g/dL Final   RDW 12/03/2021 16.3 (H)  11.5 - 15.5 % Final   Platelets 12/03/2021 182  150 - 400 K/uL Final   nRBC 12/03/2021 0.0  0.0 - 0.2 % Final   Performed at [facility], [place] [address], [place], [place] [address]   ABO/RH(D) 12/03/2021 AB POS   Final   Antibody Screen 12/03/2021 NEG   Final   Sample Expiration 12/03/2021 12/13/2021,2359   Final   Extend sample reason 12/03/2021    Final                   Value:NO TRANSFUSIONS OR PREGNANCY IN THE PAST 3 MONTHS Performed at [place] [address], [place], [place] [address]    Glucose-Capillary 12/03/2021 121 (H)  70 - 99 mg/dL Final   Glucose reference range applies only to samples taken after fasting for at least 8 hours.     X-Rays:US EKG SITE RITE  Result Date: 12/10/2021 If Site Rite image not attached, placement could not be confirmed due to current cardiac rhythm.   EKG: Orders placed or performed during the hospital encounter of 12/03/21   EKG 12 lead per protocol   EKG 12 lead per protocol     Hospital Course: KIMANI BEDOYA is a 76 y.o. who was admitted to [facility]. They were brought to the operating room on  12/10/2021 and underwent Procedure(s): [place].  Patient tolerated the procedure well and was later transferred to the recovery room and then to the orthopaedic floor for postoperative care. They were given PO and IV analgesics for pain control following their surgery. They were given 24 hours of postoperative antibiotics of  Anti-infectives (From admission, onward)    Start     Dose/Rate Route Frequency Ordered Stop   12/15/21 0000  daptomycin (CUBICIN) IVPB        700 mg Intravenous Every 24 hours 12/14/21 1139 01/21/22 2359   12/15/21 0000  cefTRIAXone (ROCEPHIN) IVPB        2 g Intravenous Every 24 hours 12/14/21 1139 01/20/22 2359   12/14/21 1330  DAPTOmycin (CUBICIN) 700 mg in sodium chloride 0.9 % IVPB  Status:  Discontinued        8 mg/kg  90 kg 128 mL/hr over 30 Minutes Intravenous Daily 12/14/21 1146 12/14/21 1922   12/14/21 1230  cefTRIAXone (ROCEPHIN) 2 g in sodium chloride 0.9 % 100 mL IVPB  Status:  Discontinued        2 g 200 mL/hr over 30 Minutes Intravenous Every 24 hours 12/14/21 1146 12/14/21 1922   12/10/21 1600  DAPTOmycin (CUBICIN) 700 mg in sodium chloride 0.9 % IVPB  Status:  Discontinued        8 mg/kg  90 kg 128 mL/hr over 30 Minutes Intravenous Daily 12/10/21 1144 12/14/21 1146   12/10/21 1530  cefTRIAXone (ROCEPHIN) 2 g in sodium chloride 0.9 % 100 mL IVPB  Status:  Discontinued        2 g 200 mL/hr over 30 Minutes Intravenous Every 24 hours 12/10/21 1144 12/14/21 1146   12/10/21 1500  ceFAZolin (ANCEF) IVPB 2g/100 mL premix  Status:  Discontinued        2 g 200 mL/hr over 30 Minutes Intravenous Every 6 hours 12/10/21 1315 12/10/21 1428   12/10/21 1020  vancomycin (VANCOCIN) powder  Status:  Discontinued  As needed 12/10/21 1020 12/10/21 1315   12/10/21 1020  tobramycin (NEBCIN) powder  Status:  Discontinued          As needed 12/10/21 1021 12/10/21 1315   12/10/21 1000  tobramycin (NEBCIN) powder 6 g        6 g Topical To Surgery 12/10/21  0951 12/10/21 1241   12/10/21 0600  ceFAZolin (ANCEF) IVPB 2g/100 mL premix        2 g 200 mL/hr over 30 Minutes Intravenous On call to O.R. 12/10/21 1610 12/10/21 0909      and started on DVT prophylaxis in the form of Aspirin.   PT and OT were ordered for total joint protocol. Discharge planning consulted to help with postop disposition and equipment needs. Patient had a good night on the evening of surgery. They started to get up OOB with therapy on POD #1.   Continued to work with therapy into POD #2 and #3. PICC line was placed. Cultures did not grow, and ID determined an antibiotic regimen.   Pt was seen during rounds on day four and was ready to go home pending progress with therapy. Pt worked with therapy for one additional session and was meeting their goals. She was discharged to home later that day in stable condition.  Diet: Regular diet Activity: PWB Follow-up: in 2 weeks Disposition: Home Discharged Condition: good   Discharge Instructions     [facility] pharmacist to adjust dose for Vancomycin, Aminoglycosides and other anti-infective therapies as requested by physician.   Complete by: As directed    [facility] to provide Cath Flo 65m   Complete by: As directed    Administer for PICC line occlusion and as ordered by physician for other access device issues.   Anaphylaxis Kit: Provided to treat any anaphylactic reaction to the medication being provided to the patient if First Dose or when requested by physician   Complete by: As directed    Epinephrine 144mml vial / amp: Administer 0.15m22m0.15ml27mubcutaneously once for moderate to severe anaphylaxis, nurse to call physician and pharmacy when reaction occurs and call 911 if needed for immediate care   Diphenhydramine 50mg50mIV vial: Administer 25-50mg 85mM PRN for first dose reaction, rash, itching, mild reaction, nurse to call physician and pharmacy when reaction occurs   Sodium Chloride 0.9% NS  500ml I21mdminister if needed for hypovolemic blood pressure drop or as ordered by physician after call to physician with anaphylactic reaction   Call MD / Call 911   Complete by: As directed    If you experience chest pain or shortness of breath, CALL 911 and be transported to the hospital emergency room.  If you develope a fever above 101 F, pus (white drainage) or increased drainage or redness at the wound, or calf pain, call your surgeon's office.   Change dressing   Complete by: As directed    Maintain surgical dressing until follow up in the clinic. If the edges start to pull up, may reinforce with tape. If the dressing is no longer working, may remove and cover with gauze and tape, but must keep the area dry and clean.  Call with any questions or concerns.   Change dressing on IV access line weekly and PRN   Complete by: As directed    Constipation Prevention   Complete by: As directed    Drink plenty of fluids.  Prune juice may be helpful.  You may use a stool softener,  such as Colace (over the counter) 100 mg twice a day.  Use MiraLax (over the counter) for constipation as needed.   Diet - low sodium heart healthy   Complete by: As directed    Flush IV access with Sodium Chloride 0.9% and Heparin 10 units/ml or 100 units/ml   Complete by: As directed    Home infusion instructions - [facility]   Complete by: As directed    Instructions: Flush IV access with Sodium Chloride 0.9% and Heparin 10units/ml or 100units/ml   Change dressing on IV access line: Weekly and PRN   Instructions Cath Flo 76m: Administer for PICC Line occlusion and as ordered by physician for other access device   [facility] pharmacist to adjust dose for: Vancomycin, Aminoglycosides and other anti-infective therapies as requested by physician   Increase activity slowly as tolerated   Complete by: As directed    Weight bearing as tolerated with assist device (walker, cane, etc) as directed,  use it as long as suggested by your surgeon or therapist, typically at least 4-6 weeks.   Method of administration may be changed at the discretion of home infusion pharmacist based upon assessment of the patient and/or caregiver's ability to self-administer the medication ordered   Complete by: As directed    Outpatient Parenteral Antibiotic Therapy Information Antibiotic: Ceftriaxone (Rocephin) IVPB, Daptomycin (Cubicin) IVPB; Indications for use: Infected TKR; End Date: 01/20/2022   Complete by: As directed    Antibiotic:  Ceftriaxone (Rocephin) IVPB Daptomycin (Cubicin) IVPB     Indications for use: Infected TKR   End Date: 01/20/2022   Post-operative opioid taper instructions:   Complete by: As directed    POST-OPERATIVE OPIOID TAPER INSTRUCTIONS: It is important to wean off of your opioid medication as soon as possible. If you do not need pain medication after your surgery it is ok to stop day one. Opioids include: Codeine, Hydrocodone(Norco, Vicodin), Oxycodone(Percocet, oxycontin) and hydromorphone amongst others.  Long term and even short term use of opiods can cause: Increased pain response Dependence Constipation Depression Respiratory depression And more.  Withdrawal symptoms can include Flu like symptoms Nausea, vomiting And more Techniques to manage these symptoms Hydrate well Eat regular healthy meals Stay active Use relaxation techniques(deep breathing, meditating, yoga) Do Not substitute Alcohol to help with tapering If you have been on opioids for less than two weeks and do not have pain than it is ok to stop all together.  Plan to wean off of opioids This plan should start within one week post op of your joint replacement. Maintain the same interval or time between taking each dose and first decrease the dose.  Cut the total daily intake of opioids by one tablet each day Next start to increase the time between doses. The last dose that should be eliminated is  the evening dose.      TED hose   Complete by: As directed    Use stockings (TED hose) for 2 weeks on both leg(s).  You may remove them at night for sleeping.      Allergies as of 12/14/2021       Reactions   Lisinopril Cough   Codeine Other (See Comments)   Tachycardia    Motrin [ibuprofen]    Pt does not remember reaction         Medication List     STOP taking these medications    naproxen 500 MG tablet Commonly known as: NAPROSYN   naproxen sodium 220  MG tablet Commonly known as: ALEVE   traMADol 50 MG tablet Commonly known as: ULTRAM       TAKE these medications    amLODipine 5 MG tablet Commonly known as: NORVASC Take 5 mg by mouth 2 (two) times daily.   aspirin 81 MG chewable tablet Chew 1 tablet (81 mg total) by mouth 2 (two) times daily for 28 days.   calcium carbonate 500 MG chewable tablet Commonly known as: TUMS - dosed in mg elemental calcium Chew 1 tablet by mouth as needed for indigestion or heartburn.   cefTRIAXone  IVPB Commonly known as: ROCEPHIN Inject 2 g into the vein daily. Indication:  Infected TKR First Dose: No Last Day of Therapy:  01/20/2022 Labs - Once weekly:  CBC/D and BMP, Labs - Every other week:  ESR and CRP Method of administration: IV Push Method of administration may be changed at the discretion of home infusion pharmacist based upon assessment of the patient and/or caregiver's ability to self-administer the medication ordered.   daptomycin  IVPB Commonly known as: CUBICIN Inject 700 mg into the vein daily. Indication:  Infected TKR First Dose: No Last Day of Therapy:  01/20/22 Labs - Once weekly:  CBC/D, BMP, and CPK Labs - Every other week:  ESR and CRP Method of administration: IV Push Method of administration may be changed at the discretion of home infusion pharmacist based upon assessment of the patient and/or caregiver's ability to self-administer the medication ordered.   ferrous sulfate dried 160 (50 Fe)  MG Tbcr SR tablet Commonly known as: SLOW FE Take 160 mg by mouth once a week.   gabapentin 300 MG capsule Commonly known as: NEURONTIN Take 300 mg by mouth 3 (three) times daily.   HYDROcodone-acetaminophen 7.5-325 MG tablet Commonly known as: NORCO Take 1 tablet by mouth every 4 (four) hours as needed for severe pain.   irbesartan 300 MG tablet Commonly known as: AVAPRO Take 300 mg by mouth daily.   methocarbamol 500 MG tablet Commonly known as: ROBAXIN Take 1 tablet (500 mg total) by mouth every 6 (six) hours as needed for muscle spasms.   metoprolol tartrate 100 MG tablet Commonly known as: LOPRESSOR Take 100 mg by mouth 2 (two) times daily.   polyethylene glycol 17 g packet Commonly known as: MIRALAX / GLYCOLAX Take 17 g by mouth 2 (two) times daily.   Potassium 99 MG Tabs Take 99 mg by mouth once a week.   rosuvastatin 5 MG tablet Commonly known as: CRESTOR Take 5 mg by mouth once a week. _0 /11/23 1139   12/14/21 0000  Change dressing       Comments: Maintain surgical dressing until follow up in the clinic. If the edges start to pull up, may reinforce with tape. If the dressing is no longer working, may remove and cover with gauze and tape, but must keep the area dry and clean.  Call with any questions or concerns.   12/14/21 1139            Follow-up Information     Paralee Cancel, MD. Schedule an  appointment as soon as possible for a visit in 2 week(s).   Specialty: Orthopedic Surgery Contact information: [address] [place] [address] [phone]         [facility] Follow up.   Why: [facility] will provide IV antibiotics in the home.        Care, [facility] Follow up.    Specialty: Home Health Services Why: Alvis Lemmings will provide nursing for labs and PICC line maintenance. Contact information: [place] [address] [phone]                 Signed: Griffith Citron, PA-C Orthopedic Surgery 12/22/2021, 7:10 AM

## 2021-12-24 DIAGNOSIS — M009 Pyogenic arthritis, unspecified: Secondary | ICD-10-CM | POA: Diagnosis not present

## 2021-12-29 DIAGNOSIS — T8453XA Infection and inflammatory reaction due to internal right knee prosthesis, initial encounter: Secondary | ICD-10-CM | POA: Diagnosis not present

## 2021-12-29 DIAGNOSIS — Z752 Other waiting period for investigation and treatment: Secondary | ICD-10-CM | POA: Diagnosis not present

## 2022-01-01 DIAGNOSIS — M009 Pyogenic arthritis, unspecified: Secondary | ICD-10-CM | POA: Diagnosis not present

## 2022-01-04 DIAGNOSIS — M009 Pyogenic arthritis, unspecified: Secondary | ICD-10-CM | POA: Diagnosis not present

## 2022-01-05 DIAGNOSIS — Z452 Encounter for adjustment and management of vascular access device: Secondary | ICD-10-CM | POA: Diagnosis not present

## 2022-01-05 DIAGNOSIS — T8453XA Infection and inflammatory reaction due to internal right knee prosthesis, initial encounter: Secondary | ICD-10-CM | POA: Diagnosis not present

## 2022-01-08 DIAGNOSIS — M009 Pyogenic arthritis, unspecified: Secondary | ICD-10-CM | POA: Diagnosis not present

## 2022-01-12 DIAGNOSIS — T8453XA Infection and inflammatory reaction due to internal right knee prosthesis, initial encounter: Secondary | ICD-10-CM | POA: Diagnosis not present

## 2022-01-12 DIAGNOSIS — D630 Anemia in neoplastic disease: Secondary | ICD-10-CM | POA: Diagnosis not present

## 2022-01-12 DIAGNOSIS — I10 Essential (primary) hypertension: Secondary | ICD-10-CM | POA: Diagnosis not present

## 2022-01-12 DIAGNOSIS — D509 Iron deficiency anemia, unspecified: Secondary | ICD-10-CM | POA: Diagnosis not present

## 2022-01-12 DIAGNOSIS — M109 Gout, unspecified: Secondary | ICD-10-CM | POA: Diagnosis not present

## 2022-01-12 DIAGNOSIS — Z452 Encounter for adjustment and management of vascular access device: Secondary | ICD-10-CM | POA: Diagnosis not present

## 2022-01-12 DIAGNOSIS — E785 Hyperlipidemia, unspecified: Secondary | ICD-10-CM | POA: Diagnosis not present

## 2022-01-12 DIAGNOSIS — R7303 Prediabetes: Secondary | ICD-10-CM | POA: Diagnosis not present

## 2022-01-12 DIAGNOSIS — D179 Benign lipomatous neoplasm, unspecified: Secondary | ICD-10-CM | POA: Diagnosis not present

## 2022-01-15 DIAGNOSIS — M009 Pyogenic arthritis, unspecified: Secondary | ICD-10-CM | POA: Diagnosis not present

## 2022-01-15 DIAGNOSIS — D179 Benign lipomatous neoplasm, unspecified: Secondary | ICD-10-CM | POA: Diagnosis not present

## 2022-01-15 DIAGNOSIS — T8453XA Infection and inflammatory reaction due to internal right knee prosthesis, initial encounter: Secondary | ICD-10-CM | POA: Diagnosis not present

## 2022-01-15 DIAGNOSIS — R7303 Prediabetes: Secondary | ICD-10-CM | POA: Diagnosis not present

## 2022-01-15 DIAGNOSIS — M109 Gout, unspecified: Secondary | ICD-10-CM | POA: Diagnosis not present

## 2022-01-15 DIAGNOSIS — E785 Hyperlipidemia, unspecified: Secondary | ICD-10-CM | POA: Diagnosis not present

## 2022-01-15 DIAGNOSIS — D630 Anemia in neoplastic disease: Secondary | ICD-10-CM | POA: Diagnosis not present

## 2022-01-15 DIAGNOSIS — I10 Essential (primary) hypertension: Secondary | ICD-10-CM | POA: Diagnosis not present

## 2022-01-15 DIAGNOSIS — D509 Iron deficiency anemia, unspecified: Secondary | ICD-10-CM | POA: Diagnosis not present

## 2022-01-15 DIAGNOSIS — Z452 Encounter for adjustment and management of vascular access device: Secondary | ICD-10-CM | POA: Diagnosis not present

## 2022-01-18 DIAGNOSIS — R7303 Prediabetes: Secondary | ICD-10-CM | POA: Diagnosis not present

## 2022-01-18 DIAGNOSIS — T8453XA Infection and inflammatory reaction due to internal right knee prosthesis, initial encounter: Secondary | ICD-10-CM | POA: Diagnosis not present

## 2022-01-18 DIAGNOSIS — D179 Benign lipomatous neoplasm, unspecified: Secondary | ICD-10-CM | POA: Diagnosis not present

## 2022-01-18 DIAGNOSIS — D630 Anemia in neoplastic disease: Secondary | ICD-10-CM | POA: Diagnosis not present

## 2022-01-18 DIAGNOSIS — Z452 Encounter for adjustment and management of vascular access device: Secondary | ICD-10-CM | POA: Diagnosis not present

## 2022-01-18 DIAGNOSIS — I10 Essential (primary) hypertension: Secondary | ICD-10-CM | POA: Diagnosis not present

## 2022-01-18 DIAGNOSIS — D509 Iron deficiency anemia, unspecified: Secondary | ICD-10-CM | POA: Diagnosis not present

## 2022-01-18 DIAGNOSIS — E785 Hyperlipidemia, unspecified: Secondary | ICD-10-CM | POA: Diagnosis not present

## 2022-01-18 DIAGNOSIS — Z792 Long term (current) use of antibiotics: Secondary | ICD-10-CM | POA: Diagnosis not present

## 2022-01-18 DIAGNOSIS — M109 Gout, unspecified: Secondary | ICD-10-CM | POA: Diagnosis not present

## 2022-01-18 DIAGNOSIS — Z5181 Encounter for therapeutic drug level monitoring: Secondary | ICD-10-CM | POA: Diagnosis not present

## 2022-01-19 ENCOUNTER — Telehealth: Payer: Self-pay

## 2022-01-19 ENCOUNTER — Encounter: Payer: Self-pay | Admitting: Internal Medicine

## 2022-01-19 ENCOUNTER — Other Ambulatory Visit: Payer: Self-pay

## 2022-01-19 ENCOUNTER — Ambulatory Visit: Payer: Medicare HMO | Admitting: Internal Medicine

## 2022-01-19 VITALS — BP 144/78 | HR 77 | Resp 16 | Ht 65.0 in | Wt 198.0 lb

## 2022-01-19 DIAGNOSIS — T8453XS Infection and inflammatory reaction due to internal right knee prosthesis, sequela: Secondary | ICD-10-CM

## 2022-01-19 DIAGNOSIS — T8453XD Infection and inflammatory reaction due to internal right knee prosthesis, subsequent encounter: Secondary | ICD-10-CM | POA: Diagnosis not present

## 2022-01-19 NOTE — Telephone Encounter (Signed)
Left vm with Dr Honor Loh office asked them to schedule 2-3 week follow up with patient to eval new implant and monitor off of the abx. Will call again tomorrow. [phone] Dr Alvan Dame office.

## 2022-01-19 NOTE — Telephone Encounter (Signed)
Pull PICC after 01/20/22 per Dr Baxter Flattery.  Advised [facility] via Chat and attached [place].

## 2022-01-19 NOTE — Progress Notes (Signed)
Patient ID: Demetrius Charity, female   DOB: 06-23-1945, 77 y.o.   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 S/p explant of HW and articulating spacer placed on 12/07. Started on broad spectrum abtx on 12/07 with plan to treat for 6 wks of  ceftriaxone 2gm IV daily plus daptomycin. See opat by jeremy frens. To finish out abtx on 1/17 then remove picc line. No issues with iv abtx  Outpatient Encounter Medications as of 01/19/2022  Medication Sig   amLODipine (NORVASC) 5 MG tablet Take 5 mg by mouth 2 (two) times daily.    calcium carbonate (TUMS - DOSED IN MG ELEMENTAL CALCIUM) 500 MG chewable tablet Chew 1 tablet by mouth as needed for indigestion or heartburn.   cefTRIAXone (ROCEPHIN) IVPB Inject 2 g into the vein daily. Indication:  Infected TKR First Dose: No Last Day of Therapy:  01/20/2022 Labs - Once weekly:  CBC/D and BMP, Labs - Every other week:  ESR and CRP Method of administration: IV Push Method of administration may be changed at the discretion of home infusion pharmacist based upon assessment of the patient and/or caregiver's ability to self-administer the medication ordered.   daptomycin (CUBICIN) IVPB Inject 700 mg into the vein daily. Indication:  Infected TKR First Dose: No Last Day of Therapy:  01/20/22 Labs - Once weekly:  CBC/D, BMP, and CPK Labs - Every other week:  ESR and CRP Method of administration: IV Push Method of administration may be changed at the discretion of home infusion pharmacist based upon assessment of the patient and/or caregiver's ability to self-administer the medication ordered.   ferrous sulfate dried (SLOW FE) 160 (50 FE) MG TBCR Take 160 mg by mouth once a week.    gabapentin (NEURONTIN) 300 MG capsule Take 300 mg by mouth 3 (three) times daily.   HYDROcodone-acetaminophen (NORCO) 7.5-325 MG tablet Take 1 tablet by mouth every 4 (four) hours as needed for severe pain.   irbesartan (AVAPRO) 300 MG  tablet Take 300 mg by mouth daily.   methocarbamol (ROBAXIN) 500 MG tablet Take 1 tablet (500 mg total) by mouth every 6 (six) hours as needed for muscle spasms.   metoprolol tartrate (LOPRESSOR) 100 MG tablet Take 100 mg by mouth 2 (two) times daily.    polyethylene glycol (MIRALAX / GLYCOLAX) 17 g packet Take 17 g by mouth 2 (two) times daily.   Potassium 99 MG TABS Take 99 mg by mouth once a week.   rosuvastatin (CRESTOR) 5 MG tablet Take 5 mg by mouth once a week. Sunday morning   No facility-administered encounter medications on file as of 01/19/2022.     Patient Active Problem List   Diagnosis Date Noted   Prosthetic joint infection, initial encounter ([place]) 12/14/2021   Infection of prosthetic right knee joint ([place]) 12/10/2021   Essential hypertension 05/31/2015   Hyperglycemia 05/31/2015   Arthritis 05/31/2015   Gallstone 05/31/2015     Health Maintenance Due  Topic Date Due   COVID-19 Vaccine (1) Never done   Hepatitis C Screening  Never done   DTaP/Tdap/Td (1 - Tdap) Never done   Zoster Vaccines- Shingrix (1 of 2) Never done   Pneumonia Vaccine 58+ Years old (1 - PCV) Never done   INFLUENZA VACCINE  Never done     Review of Systems" arthritis and deconditioning. Otherwise 12 point ros is negative Physical Exam   BP (!) 144/78   Pulse 77  Resp 16   Ht '5\' 5"'$  (1.651 m)   Wt 198 lb (89.8 kg)   SpO2 99%   BMI 32.95 kg/m    Physical Exam  Constitutional:  oriented to person, place, and time. appears well-developed and well-nourished. No distress.  HENT: [place]/AT, PERRLA, no scleral icterus Mouth/Throat: Oropharynx is clear and moist. No oropharyngeal exudate.  Cardiovascular: Normal rate, regular rhythm and normal heart sounds. Exam reveals no gallop and no friction rub.  No murmur heard.  Pulmonary/Chest: Effort normal and breath sounds normal. No respiratory distress.  has no wheezes.  Ext: trace pitting edema Neurological: alert and oriented to person, place, and  time.  Skin: Skin is warm and dry. No rash noted. No erythema.  Psychiatric: a normal mood and affect.  behavior is normal.    CBC Lab Results  Component Value Date   WBC 7.8 12/11/2021   RBC 3.99 12/11/2021   HGB 10.4 (L) 12/11/2021   HCT 32.0 (L) 12/11/2021   PLT 177 12/11/2021   MCV 80.2 12/11/2021   MCH 26.1 12/11/2021   MCHC 32.5 12/11/2021   RDW 16.3 (H) 12/11/2021   LYMPHSABS 0.8 12/11/2021   MONOABS 0.6 12/11/2021   EOSABS 0.0 12/11/2021    BMET Lab Results  Component Value Date   NA 141 12/11/2021   K 3.8 12/11/2021   CL 108 12/11/2021   CO2 25 12/11/2021   GLUCOSE 141 (H) 12/11/2021   BUN 14 12/11/2021   CREATININE 0.66 12/11/2021   CALCIUM 8.3 (L) 12/11/2021   GFRNONAA >60 12/11/2021   GFRAA >60 08/31/2018   Lab Results  Component Value Date   ESRSEDRATE 17 12/11/2021   Lab Results  Component Value Date   CRP 2.0 (H) 12/11/2021   21/2 - sedrate/crp from 1/15   Assessment and Plan Culture negative prosthetic joint infection = plan for finishing  out abtx on 1/17 then pull off picc line, monitor off of abtx Needs to follow up with dr Alvan Dame to assess when new knee replacement.

## 2022-01-21 DIAGNOSIS — M109 Gout, unspecified: Secondary | ICD-10-CM | POA: Diagnosis not present

## 2022-01-21 DIAGNOSIS — I10 Essential (primary) hypertension: Secondary | ICD-10-CM | POA: Diagnosis not present

## 2022-01-21 DIAGNOSIS — D509 Iron deficiency anemia, unspecified: Secondary | ICD-10-CM | POA: Diagnosis not present

## 2022-01-21 DIAGNOSIS — E785 Hyperlipidemia, unspecified: Secondary | ICD-10-CM | POA: Diagnosis not present

## 2022-01-21 DIAGNOSIS — R7303 Prediabetes: Secondary | ICD-10-CM | POA: Diagnosis not present

## 2022-01-21 DIAGNOSIS — D179 Benign lipomatous neoplasm, unspecified: Secondary | ICD-10-CM | POA: Diagnosis not present

## 2022-01-21 DIAGNOSIS — D630 Anemia in neoplastic disease: Secondary | ICD-10-CM | POA: Diagnosis not present

## 2022-01-21 DIAGNOSIS — T8453XA Infection and inflammatory reaction due to internal right knee prosthesis, initial encounter: Secondary | ICD-10-CM | POA: Diagnosis not present

## 2022-01-21 DIAGNOSIS — Z452 Encounter for adjustment and management of vascular access device: Secondary | ICD-10-CM | POA: Diagnosis not present

## 2022-01-28 DIAGNOSIS — I10 Essential (primary) hypertension: Secondary | ICD-10-CM | POA: Diagnosis not present

## 2022-01-28 DIAGNOSIS — Z452 Encounter for adjustment and management of vascular access device: Secondary | ICD-10-CM | POA: Diagnosis not present

## 2022-01-28 DIAGNOSIS — D509 Iron deficiency anemia, unspecified: Secondary | ICD-10-CM | POA: Diagnosis not present

## 2022-01-28 DIAGNOSIS — D630 Anemia in neoplastic disease: Secondary | ICD-10-CM | POA: Diagnosis not present

## 2022-01-28 DIAGNOSIS — E785 Hyperlipidemia, unspecified: Secondary | ICD-10-CM | POA: Diagnosis not present

## 2022-01-28 DIAGNOSIS — T8453XA Infection and inflammatory reaction due to internal right knee prosthesis, initial encounter: Secondary | ICD-10-CM | POA: Diagnosis not present

## 2022-01-28 DIAGNOSIS — D179 Benign lipomatous neoplasm, unspecified: Secondary | ICD-10-CM | POA: Diagnosis not present

## 2022-01-28 DIAGNOSIS — M109 Gout, unspecified: Secondary | ICD-10-CM | POA: Diagnosis not present

## 2022-01-28 DIAGNOSIS — R7303 Prediabetes: Secondary | ICD-10-CM | POA: Diagnosis not present

## 2022-02-10 DIAGNOSIS — M25511 Pain in right shoulder: Secondary | ICD-10-CM | POA: Diagnosis not present

## 2022-02-11 DIAGNOSIS — Z5189 Encounter for other specified aftercare: Secondary | ICD-10-CM | POA: Diagnosis not present

## 2022-03-11 ENCOUNTER — Encounter (HOSPITAL_COMMUNITY): Payer: Medicare HMO

## 2022-03-19 NOTE — Patient Instructions (Signed)
DUE TO COVID-19 ONLY TWO VISITORS  (aged 77 and older)  ARE ALLOWED TO COME WITH YOU AND STAY IN THE WAITING ROOM ONLY DURING PRE OP AND PROCEDURE.   **NO VISITORS ARE ALLOWED IN THE SHORT STAY AREA OR RECOVERY ROOM!!**  IF YOU WILL BE ADMITTED INTO THE HOSPITAL YOU ARE ALLOWED ONLY FOUR SUPPORT PEOPLE DURING VISITATION HOURS ONLY (7 AM -8PM)   The support person(s) must pass our screening, gel in and out, and wear a mask at all times, including in the patient's room. Patients must also wear a mask when staff or their support person are in the room. Visitors GUEST BADGE MUST BE WORN VISIBLY  One adult visitor may remain with you overnight and MUST be in the room by 8 P.M.     Your procedure is scheduled on: 04/02/22   Report to [facility] Main Entrance    Report to admitting at : 11:00 AM   Call this number if you have problems the morning of surgery [phone]   Do not eat food :After Midnight.   After Midnight you may have the following liquids until : 10:30 AM DAY OF SURGERY  Water Black Coffee (sugar ok, NO MILK/CREAM OR CREAMERS)  Tea (sugar ok, NO MILK/CREAM OR CREAMERS) regular and decaf                             Plain Jell-O (NO RED)                                           Fruit ices (not with fruit pulp, NO RED)                                     Popsicles (NO RED)                                                                  Juice: apple, WHITE grape, WHITE cranberry Sports drinks like Gatorade (NO RED)     The day of surgery:  Drink ONE (1) Pre-Surgery Clear G2 at: 10:30 AM the morning of surgery. Drink in one sitting. Do not sip.  This drink was given to you during your hospital  pre-op appointment visit. Nothing else to drink after completing the  Pre-Surgery Clear Ensure or G2.          If you have questions, please contact your surgeon's office.  Oral Hygiene is also important to reduce your risk of infection.                                     Remember - BRUSH YOUR TEETH THE MORNING OF SURGERY WITH YOUR REGULAR TOOTHPASTE  DENTURES WILL BE REMOVED PRIOR TO SURGERY PLEASE DO NOT APPLY "Poly grip" OR ADHESIVES!!!   Do NOT smoke after Midnight   Take these medicines the morning of surgery with A SIP OF WATER: gabapentin,metoprolol,amlodipine.Tylenol as needed.  You may not have any metal on your body including hair pins, jewelry, and body piercing             Do not wear make-up, lotions, powders, perfumes/cologne, or deodorant  Do not wear nail polish including gel and S&S, artificial/acrylic nails, or any other type of covering on natural nails including finger and toenails. If you have artificial nails, gel coating, etc. that needs to be removed by a nail salon please have this removed prior to surgery or surgery may need to be canceled/ delayed if the surgeon/ anesthesia feels like they are unable to be safely monitored.   Do not shave  48 hours prior to surgery.    Do not bring valuables to the hospital. [place].   Contacts, glasses, or bridgework may not be worn into surgery.   Bring small overnight bag day of surgery.   DO NOT [place]. PHARMACY WILL DISPENSE MEDICATIONS LISTED ON YOUR MEDICATION LIST TO YOU DURING YOUR ADMISSION [place]!    Patients discharged on the day of surgery will not be allowed to drive home.  Someone NEEDS to stay with you for the first 24 hours after anesthesia.   Special Instructions: Bring a copy of your healthcare power of attorney and living will documents         the day of surgery if you haven't scanned them before.              Please read over the following fact sheets you were given: IF YOU HAVE QUESTIONS ABOUT YOUR PRE-OP INSTRUCTIONS PLEASE CALL [phone]    [facility] - Preparing for Surgery Before surgery, you can play an important role.  Because skin is not  sterile, your skin needs to be as free of germs as possible.  You can reduce the number of germs on your skin by washing with CHG (chlorahexidine gluconate) soap before surgery.  CHG is an antiseptic cleaner which kills germs and bonds with the skin to continue killing germs even after washing. Please DO NOT use if you have an allergy to CHG or antibacterial soaps.  If your skin becomes reddened/irritated stop using the CHG and inform your nurse when you arrive at Short Stay. Do not shave (including legs and underarms) for at least 48 hours prior to the first CHG shower.  You may shave your face/neck. Please follow these instructions carefully:  1.  Shower with CHG Soap the night before surgery and the  morning of Surgery.  2.  If you choose to wash your hair, wash your hair first as usual with your  normal  shampoo.  3.  After you shampoo, rinse your hair and body thoroughly to remove the  shampoo.                           4.  Use CHG as you would any other liquid soap.  You can apply chg directly  to the skin and wash                       Gently with a scrungie or clean washcloth.  5.  Apply the CHG Soap to your body ONLY FROM THE NECK DOWN.   Do not use on face/ open  Wound or open sores. Avoid contact with eyes, ears mouth and genitals (private parts).                       Wash face,  Genitals (private parts) with your normal soap.             6.  Wash thoroughly, paying special attention to the area where your surgery  will be performed.  7.  Thoroughly rinse your body with warm water from the neck down.  8.  DO NOT shower/wash with your normal soap after using and rinsing off  the CHG Soap.                9.  Pat yourself dry with a clean towel.            10.  Wear clean pajamas.            11.  Place clean sheets on your bed the night of your first shower and do not  sleep with pets. Day of Surgery : Do not apply any lotions/deodorants the morning of surgery.   Please wear clean clothes to the hospital/surgery center.  FAILURE TO FOLLOW THESE INSTRUCTIONS MAY RESULT IN THE CANCELLATION OF YOUR SURGERY PATIENT SIGNATURE_________________________________  NURSE SIGNATURE__________________________________  ________________________________________________________________________  Adam Phenix  An incentive spirometer is a tool that can help keep your lungs clear and active. This tool measures how well you are filling your lungs with each breath. Taking long deep breaths may help reverse or decrease the chance of developing breathing (pulmonary) problems (especially infection) following: A long period of time when you are unable to move or be active. BEFORE THE PROCEDURE  If the spirometer includes an indicator to show your best effort, your nurse or respiratory therapist will set it to a desired goal. If possible, sit up straight or lean slightly forward. Try not to slouch. Hold the incentive spirometer in an upright position. INSTRUCTIONS FOR USE  Sit on the edge of your bed if possible, or sit up as far as you can in bed or on a chair. Hold the incentive spirometer in an upright position. Breathe out normally. Place the mouthpiece in your mouth and seal your lips tightly around it. Breathe in slowly and as deeply as possible, raising the piston or the ball toward the top of the column. Hold your breath for 3-5 seconds or for as long as possible. Allow the piston or ball to fall to the bottom of the column. Remove the mouthpiece from your mouth and breathe out normally. Rest for a few seconds and repeat Steps 1 through 7 at least 10 times every 1-2 hours when you are awake. Take your time and take a few normal breaths between deep breaths. The spirometer may include an indicator to show your best effort. Use the indicator as a goal to work toward during each repetition. After each set of 10 deep breaths, practice coughing to be sure your  lungs are clear. If you have an incision (the cut made at the time of surgery), support your incision when coughing by placing a pillow or rolled up towels firmly against it. Once you are able to get out of bed, walk around indoors and cough well. You may stop using the incentive spirometer when instructed by your caregiver.  RISKS AND COMPLICATIONS Take your time so you do not get dizzy or light-headed. If you are in pain, you may need to take or ask  for pain medication before doing incentive spirometry. It is harder to take a deep breath if you are having pain. AFTER USE Rest and breathe slowly and easily. It can be helpful to keep track of a log of your progress. Your caregiver can provide you with a simple table to help with this. If you are using the spirometer at home, follow these instructions: [place] IF:  You are having difficultly using the spirometer. You have trouble using the spirometer as often as instructed. Your pain medication is not giving enough relief while using the spirometer. You develop fever of 100.5 F (38.1 C) or higher. SEEK IMMEDIATE MEDICAL CARE IF:  You cough up bloody sputum that had not been present before. You develop fever of 102 F (38.9 C) or greater. You develop worsening pain at or near the incision site. MAKE SURE YOU:  Understand these instructions. Will watch your condition. Will get help right away if you are not doing well or get worse. Document Released: 05/03/2006 Document Revised: 03/15/2011 Document Reviewed: 07/04/2006 [facility] Patient Information 2014 [place], [place].   ________________________________________________________________________

## 2022-03-22 ENCOUNTER — Other Ambulatory Visit: Payer: Self-pay

## 2022-03-22 ENCOUNTER — Encounter (HOSPITAL_COMMUNITY)
Admission: RE | Admit: 2022-03-22 | Discharge: 2022-03-22 | Disposition: A | Discharge status: Home / Self Care | Payer: Medicare HMO | Source: Ambulatory Visit | Attending: Orthopedic Surgery | Admitting: Orthopedic Surgery

## 2022-03-22 ENCOUNTER — Encounter (HOSPITAL_COMMUNITY): Payer: Self-pay

## 2022-03-22 VITALS — BP 147/78 | HR 54 | Temp 98.3°F | Ht 65.0 in | Wt 201.0 lb

## 2022-03-22 DIAGNOSIS — I10 Essential (primary) hypertension: Secondary | ICD-10-CM | POA: Diagnosis not present

## 2022-03-22 DIAGNOSIS — R739 Hyperglycemia, unspecified: Secondary | ICD-10-CM

## 2022-03-22 DIAGNOSIS — Z01812 Encounter for preprocedural laboratory examination: Secondary | ICD-10-CM | POA: Diagnosis not present

## 2022-03-22 DIAGNOSIS — Z01818 Encounter for other preprocedural examination: Secondary | ICD-10-CM

## 2022-03-22 HISTORY — DX: Carpal tunnel syndrome, bilateral upper limbs: G56.03

## 2022-03-22 LAB — CBC
HCT: 42.6 % (ref 36.0–46.0)
Hemoglobin: 14 g/dL (ref 12.0–15.0)
MCH: 27.6 pg (ref 26.0–34.0)
MCHC: 32.9 g/dL (ref 30.0–36.0)
MCV: 84 fL (ref 80.0–100.0)
Platelets: 150 10*3/uL (ref 150–400)
RBC: 5.07 MIL/uL (ref 3.87–5.11)
RDW: 16.1 % — ABNORMAL HIGH (ref 11.5–15.5)
WBC: 6 10*3/uL (ref 4.0–10.5)
nRBC: 0 % (ref 0.0–0.2)

## 2022-03-22 LAB — BASIC METABOLIC PANEL
Anion gap: 7 (ref 5–15)
BUN: 16 mg/dL (ref 8–23)
CO2: 27 mmol/L (ref 22–32)
Calcium: 9.3 mg/dL (ref 8.9–10.3)
Chloride: 106 mmol/L (ref 98–111)
Creatinine, Ser: 0.85 mg/dL (ref 0.44–1.00)
GFR, Estimated: >60 mL/min (ref >60)
Glucose, Bld: 127 mg/dL — ABNORMAL HIGH (ref 70–99)
Potassium: 3.6 mmol/L (ref 3.5–5.1)
Sodium: 140 mmol/L (ref 135–145)

## 2022-03-22 LAB — SURGICAL PCR SCREEN
MRSA, PCR: NEGATIVE
Staphylococcus aureus: POSITIVE — AB

## 2022-03-22 LAB — GLUCOSE, CAPILLARY: Glucose-Capillary: 132 mg/dL — ABNORMAL HIGH (ref 70–99)

## 2022-03-22 NOTE — Progress Notes (Signed)
PCR: + STAPH °

## 2022-03-22 NOTE — Progress Notes (Signed)
For Short Stay: [place] appointment date:  Bowel Prep reminder:   For Anesthesia: PCP - Dr. Maury Dus. Cardiologist - N/A  Chest x-ray -  EKG - 12/03/21 Stress Test -  ECHO -  Cardiac Cath -  Pacemaker/ICD device last checked: Pacemaker orders received: Device Rep notified:  Spinal Cord Stimulator:  Sleep Study - N/A CPAP -   Fasting Blood Sugar - N/A Checks Blood Sugar __0___ times a day Date and result of last Hgb A1c- 12/03/21: 6.1  Last dose of GLP1 agonist- N/A GLP1 instructions:   Last dose of SGLT-2 inhibitors- N/A SGLT-2 instructions:   Blood Thinner Instructions: Aspirin Instructions: Last Dose:  Activity level: Can go up a flight of stairs and activities of daily living without stopping and without chest pain and/or shortness of breath   Able to exercise without chest pain and/or shortness of breath    Anesthesia review: HTN,Pre-DIA.  Patient denies shortness of breath, fever, cough and chest pain at PAT appointment   Patient verbalized understanding of instructions that were given to them at the PAT appointment. Patient was also instructed that they will need to review over the PAT instructions again at home before surgery.

## 2022-03-23 LAB — HEMOGLOBIN A1C
Hgb A1c MFr Bld: 6.6 % — ABNORMAL HIGH (ref 4.8–5.6)
Mean Plasma Glucose: 143 mg/dL

## 2022-04-01 NOTE — H&P (Signed)
TOTAL KNEE REVISION ADMISSION H&P  Patient is being admitted for right total knee reimplantation  Subjective:  Chief Complaint:right knee pain.  HPI: Susan Turner, 77 y.o. female. She has a history of right knee PJI, for which she was treated with resection and placement of antibiotic spacer. She underwent 6 weeks IV antibiotics. She is here for right total knee reimplantation.   Patient Active Problem List   Diagnosis Date Noted   Prosthetic joint infection, initial encounter ([place]) 12/14/2021   Infection of prosthetic right knee joint ([place]) 12/10/2021   Essential hypertension 05/31/2015   Hyperglycemia 05/31/2015   Arthritis 05/31/2015   Gallstone 05/31/2015   Past Medical History:  Diagnosis Date   Anemia    Arthritis    Carpal tunnel syndrome on both sides    Gout    Hyperlipidemia    Hypertension    Iron deficiency anemia    Lipoma of back    Pneumonia    Pre-diabetes    URI (upper respiratory infection)    Weight loss     Past Surgical History:  Procedure Laterality Date   DILATION AND CURETTAGE OF UTERUS     HAND SURGERY Right    Lipoma removed   JOINT REPLACEMENT     TOTAL KNEE ARTHROPLASTY Right    TOTAL KNEE REVISION Right 12/10/2021   Procedure: TOTAL KNEE REVISION;  Surgeon: Paralee Cancel, MD;  Location: [facility];  Service: Orthopedics;  Laterality: Right;    No current facility-administered medications for this encounter.   Current Outpatient Medications  Medication Sig Dispense Refill Last Dose   acetaminophen (TYLENOL) 500 MG tablet Take 1,000 mg by mouth every 6 (six) hours as needed for moderate pain, mild pain or headache.      amLODipine (NORVASC) 5 MG tablet Take 5 mg by mouth 2 (two) times daily.       calcium carbonate (TUMS - DOSED IN MG ELEMENTAL CALCIUM) 500 MG chewable tablet Chew 2 tablets by mouth daily as needed for indigestion or heartburn.      ferrous sulfate dried (SLOW FE) 160 (50 FE) MG TBCR Take 160 mg by mouth once a week.        gabapentin (NEURONTIN) 300 MG capsule Take 300 mg by mouth 3 (three) times daily.      irbesartan (AVAPRO) 300 MG tablet Take 300 mg by mouth daily.      methocarbamol (ROBAXIN) 500 MG tablet Take 1 tablet (500 mg total) by mouth every 6 (six) hours as needed for muscle spasms. 40 tablet 2    metoprolol tartrate (LOPRESSOR) 100 MG tablet Take 100 mg by mouth 2 (two) times daily.       polyethylene glycol (MIRALAX / GLYCOLAX) 17 g packet Take 17 g by mouth 2 (two) times daily. (Patient taking differently: Take 17 g by mouth daily as needed for mild constipation or moderate constipation.) 14 each 0    Potassium 99 MG TABS Take 99 mg by mouth once a week.      rosuvastatin (CRESTOR) 5 MG tablet Take 5 mg by mouth once a week. Sunday morning      Allergies  Allergen Reactions   Lisinopril Cough   Codeine Other (See Comments)    Tachycardia    Motrin [Ibuprofen]     Pt does not remember reaction     Social History   Tobacco Use   Smoking status: Never   Smokeless tobacco: Never  Substance Use Topics   Alcohol use: No  Family History  Problem Relation Age of Onset   Hypertension Mother    Stroke Mother    Cancer Father       Review of Systems     Objective:  Physical Exam Well nourished and well developed. General: Alert and oriented x3, cooperative and pleasant, no acute distress. Head: normocephalic, atraumatic, neck supple. Eyes: EOMI.  Musculoskeletal: Right Knee: Well healed arthroplasty scar ROM deferred due to spacer in place No erythema, no effusion  Calves soft and nontender. Motor function intact in LE. Strength 5/5 LE bilaterally. Neuro: Distal pulses 2+. Sensation to light touch intact in LE.  Labs:  Vital signs in last 24 hours:    Labs:  Estimated body mass index is 33.45 kg/m as calculated from the following:   Height as of 03/22/22: 5\' 5"  (1.651 m).   Weight as of 03/22/22: 91.2 kg.  Imaging Review No interim radiographs  Labs:  ESR:  23 (slightly elevated) CRP: 2  Assessment/Plan:  Status post right total knee arthroplasty resection  The patient history, physical examination, clinical judgment of the provider and imaging studies are consistent with v of the right knee(s), previous total knee arthroplasty. Revision total knee arthroplasty is deemed medically necessary. The treatment options including medical management, injection therapy, arthroscopy and revision arthroplasty were discussed at length. The risks and benefits of revision total knee arthroplasty were presented and reviewed. The risks due to aseptic loosening, infection, stiffness, patella tracking problems, thromboembolic complications and other imponderables were discussed. The patient acknowledged the explanation, agreed to proceed with the plan and consent was signed. Patient is being admitted for inpatient treatment for surgery, pain control, PT, OT, prophylactic antibiotics, VTE prophylaxis, progressive ambulation and ADL's and discharge planning.The patient is planning to be discharged  home.   Therapy Plans: outpatient therapy at [facility] Disposition: Home with Jerry's help, and sister helping Planned DVT Prophylaxis: aspirin 81mg  BID DME needed: none PCP: Dr. Maury Dus, clearance received TXA: IV Allergies: ibuprofen - dizziness, codeine - tachycardia Anesthesia Concerns: **prefers anesthesiologist to do spinal ** Had a lot of pain with her last ESI BMI: 36.4 Last HgbA1c: 6.5%   Other: - Caretaker Sonia Side may not be available until Sunday/Monday - will likely stay through the weekend - Still having right shoulder pain - hydrocodone, robaxin, tylenol - No hx of VTE or CA  Costella Hatcher, PA-C Orthopedic Surgery [facility] [place] [phone]

## 2022-04-01 NOTE — Progress Notes (Signed)
Left message for pt to arrive to [facility] admitting at 1030 instead of 1100 04/02/22.

## 2022-04-02 ENCOUNTER — Inpatient Hospital Stay (HOSPITAL_COMMUNITY)
Admission: RE | Admit: 2022-04-02 | Discharge: 2022-04-05 | DRG: 468 | Disposition: A | Discharge status: Home / Self Care | Payer: Medicare HMO | Source: Ambulatory Visit | Attending: Orthopedic Surgery | Admitting: Orthopedic Surgery

## 2022-04-02 ENCOUNTER — Inpatient Hospital Stay (HOSPITAL_COMMUNITY): Payer: Medicare HMO | Admitting: Physician Assistant

## 2022-04-02 ENCOUNTER — Other Ambulatory Visit: Payer: Self-pay

## 2022-04-02 ENCOUNTER — Inpatient Hospital Stay (HOSPITAL_COMMUNITY): Payer: Medicare HMO

## 2022-04-02 ENCOUNTER — Encounter (HOSPITAL_COMMUNITY)
Admission: RE | Disposition: A | Discharge status: Home / Self Care | Payer: Self-pay | Source: Ambulatory Visit | Attending: Orthopedic Surgery

## 2022-04-02 ENCOUNTER — Encounter (HOSPITAL_COMMUNITY): Payer: Self-pay | Admitting: Orthopedic Surgery

## 2022-04-02 DIAGNOSIS — Z96651 Presence of right artificial knee joint: Secondary | ICD-10-CM | POA: Diagnosis not present

## 2022-04-02 DIAGNOSIS — E785 Hyperlipidemia, unspecified: Secondary | ICD-10-CM | POA: Diagnosis present

## 2022-04-02 DIAGNOSIS — I10 Essential (primary) hypertension: Secondary | ICD-10-CM | POA: Diagnosis present

## 2022-04-02 DIAGNOSIS — Y831 Surgical operation with implant of artificial internal device as the cause of abnormal reaction of the patient, or of later complication, without mention of misadventure at the time of the procedure: Secondary | ICD-10-CM | POA: Diagnosis present

## 2022-04-02 DIAGNOSIS — T8453XD Infection and inflammatory reaction due to internal right knee prosthesis, subsequent encounter: Secondary | ICD-10-CM | POA: Diagnosis not present

## 2022-04-02 DIAGNOSIS — Z809 Family history of malignant neoplasm, unspecified: Secondary | ICD-10-CM

## 2022-04-02 DIAGNOSIS — Z886 Allergy status to analgesic agent status: Secondary | ICD-10-CM | POA: Diagnosis not present

## 2022-04-02 DIAGNOSIS — Z823 Family history of stroke: Secondary | ICD-10-CM

## 2022-04-02 DIAGNOSIS — Z8249 Family history of ischemic heart disease and other diseases of the circulatory system: Secondary | ICD-10-CM

## 2022-04-02 DIAGNOSIS — Z888 Allergy status to other drugs, medicaments and biological substances status: Secondary | ICD-10-CM | POA: Diagnosis not present

## 2022-04-02 DIAGNOSIS — Z885 Allergy status to narcotic agent status: Secondary | ICD-10-CM

## 2022-04-02 DIAGNOSIS — M109 Gout, unspecified: Secondary | ICD-10-CM | POA: Diagnosis present

## 2022-04-02 DIAGNOSIS — Z79899 Other long term (current) drug therapy: Secondary | ICD-10-CM | POA: Diagnosis not present

## 2022-04-02 DIAGNOSIS — T8453XA Infection and inflammatory reaction due to internal right knee prosthesis, initial encounter: Secondary | ICD-10-CM

## 2022-04-02 DIAGNOSIS — G8918 Other acute postprocedural pain: Secondary | ICD-10-CM | POA: Diagnosis not present

## 2022-04-02 DIAGNOSIS — R7303 Prediabetes: Secondary | ICD-10-CM | POA: Diagnosis not present

## 2022-04-02 HISTORY — PX: REIMPLANTATION OF TOTAL KNEE: SHX6052

## 2022-04-02 LAB — GLUCOSE, CAPILLARY: Glucose-Capillary: 155 mg/dL — ABNORMAL HIGH (ref 70–99)

## 2022-04-02 SURGERY — REVISION, TOTAL ARTHROPLASTY, KNEE
Anesthesia: Monitor Anesthesia Care | Site: Knee | Laterality: Right

## 2022-04-02 MED ORDER — VANCOMYCIN HCL 1000 MG IV SOLR
INTRAVENOUS | Status: AC
  Filled 2022-04-02: qty 20

## 2022-04-02 MED ORDER — PROPOFOL 10 MG/ML IV BOLUS
INTRAVENOUS | Status: DC | PRN
  Administered 2022-04-02: 10 mg via INTRAVENOUS
  Administered 2022-04-02: 20 mg via INTRAVENOUS

## 2022-04-02 MED ORDER — HYDROCODONE-ACETAMINOPHEN 7.5-325 MG PO TABS: 1.0000 | ORAL_TABLET | ORAL | Status: DC | PRN

## 2022-04-02 MED ORDER — BUPIVACAINE IN DEXTROSE 0.75-8.25 % IT SOLN
INTRATHECAL | Status: DC | PRN
  Administered 2022-04-02: 1.8 mL via INTRATHECAL

## 2022-04-02 MED ORDER — ORAL CARE MOUTH RINSE: 15.0000 mL | Freq: Once | OROMUCOSAL | Status: AC

## 2022-04-02 MED ORDER — POLYETHYLENE GLYCOL 3350 17 G PO PACK
17.0000 g | PACK | Freq: Two times a day (BID) | ORAL | Status: DC
  Filled 2022-04-02 (×2): qty 1

## 2022-04-02 MED ORDER — PHENOL 1.4 % MT LIQD: 1.0000 | OROMUCOSAL | Status: DC | PRN

## 2022-04-02 MED ORDER — POVIDONE-IODINE 10 % EX SWAB: 2.0000 | Freq: Once | CUTANEOUS | Status: DC

## 2022-04-02 MED ORDER — ONDANSETRON HCL 4 MG/2ML IJ SOLN
INTRAMUSCULAR | Status: AC
  Filled 2022-04-02: qty 2

## 2022-04-02 MED ORDER — ONDANSETRON HCL 4 MG/2ML IJ SOLN
INTRAMUSCULAR | Status: DC | PRN
  Administered 2022-04-02: 4 mg via INTRAVENOUS

## 2022-04-02 MED ORDER — OXYCODONE HCL 5 MG/5ML PO SOLN: 5.0000 mg | Freq: Once | ORAL | Status: DC | PRN

## 2022-04-02 MED ORDER — CEFAZOLIN SODIUM-DEXTROSE 2-4 GM/100ML-% IV SOLN
2.0000 g | INTRAVENOUS | Status: AC
  Administered 2022-04-02: 2 g via INTRAVENOUS
  Filled 2022-04-02: qty 100

## 2022-04-02 MED ORDER — OXYCODONE HCL 5 MG PO TABS: 5.0000 mg | ORAL_TABLET | Freq: Once | ORAL | Status: DC | PRN

## 2022-04-02 MED ORDER — BUPIVACAINE-EPINEPHRINE (PF) 0.5% -1:200000 IJ SOLN
INTRAMUSCULAR | Status: DC | PRN
  Administered 2022-04-02: 20 mL via PERINEURAL

## 2022-04-02 MED ORDER — MIDAZOLAM HCL 2 MG/2ML IJ SOLN
0.5000 mg | Freq: Once | INTRAMUSCULAR | Status: DC
  Filled 2022-04-02: qty 2

## 2022-04-02 MED ORDER — FENTANYL CITRATE (PF) 100 MCG/2ML IJ SOLN
INTRAMUSCULAR | Status: DC | PRN
  Administered 2022-04-02 (×2): 50 ug via INTRAVENOUS

## 2022-04-02 MED ORDER — IRBESARTAN 150 MG PO TABS
300.0000 mg | ORAL_TABLET | Freq: Every day | ORAL | Status: DC
  Administered 2022-04-03 – 2022-04-05 (×3): 300 mg via ORAL
  Filled 2022-04-02 (×4): qty 2

## 2022-04-02 MED ORDER — ACETAMINOPHEN 10 MG/ML IV SOLN
INTRAVENOUS | Status: DC | PRN
  Administered 2022-04-02: 1000 mg via INTRAVENOUS

## 2022-04-02 MED ORDER — PHENYLEPHRINE HCL-NACL 20-0.9 MG/250ML-% IV SOLN
INTRAVENOUS | Status: AC
  Filled 2022-04-02: qty 250

## 2022-04-02 MED ORDER — TRANEXAMIC ACID-NACL 1000-0.7 MG/100ML-% IV SOLN
1000.0000 mg | INTRAVENOUS | Status: AC
  Administered 2022-04-02: 1000 mg via INTRAVENOUS
  Filled 2022-04-02: qty 100

## 2022-04-02 MED ORDER — METOPROLOL TARTRATE 50 MG PO TABS
100.0000 mg | ORAL_TABLET | Freq: Two times a day (BID) | ORAL | Status: DC
  Administered 2022-04-03 – 2022-04-05 (×6): 100 mg via ORAL
  Filled 2022-04-02 (×6): qty 2

## 2022-04-02 MED ORDER — LACTATED RINGERS IV SOLN: INTRAVENOUS | Status: DC

## 2022-04-02 MED ORDER — BUPIVACAINE HCL (PF) 0.25 % IJ SOLN
INTRAMUSCULAR | Status: AC
  Filled 2022-04-02: qty 30

## 2022-04-02 MED ORDER — ACETAMINOPHEN 10 MG/ML IV SOLN: 1000.0000 mg | Freq: Once | INTRAVENOUS | Status: DC | PRN

## 2022-04-02 MED ORDER — SODIUM CHLORIDE 0.9 % IV SOLN: INTRAVENOUS | Status: DC

## 2022-04-02 MED ORDER — SODIUM CHLORIDE (PF) 0.9 % IJ SOLN
INTRAMUSCULAR | Status: AC
  Filled 2022-04-02: qty 30

## 2022-04-02 MED ORDER — ACETAMINOPHEN 325 MG PO TABS: 325.0000 mg | ORAL_TABLET | Freq: Four times a day (QID) | ORAL | Status: DC | PRN

## 2022-04-02 MED ORDER — ROSUVASTATIN CALCIUM 5 MG PO TABS
5.0000 mg | ORAL_TABLET | ORAL | Status: DC
  Administered 2022-04-04: 5 mg via ORAL
  Filled 2022-04-02 (×2): qty 1

## 2022-04-02 MED ORDER — PROPOFOL 500 MG/50ML IV EMUL
INTRAVENOUS | Status: DC | PRN
  Administered 2022-04-02: 25 ug/kg/min via INTRAVENOUS

## 2022-04-02 MED ORDER — MENTHOL 3 MG MT LOZG: 1.0000 | LOZENGE | OROMUCOSAL | Status: DC | PRN

## 2022-04-02 MED ORDER — ACETAMINOPHEN 160 MG/5ML PO SOLN: 1000.0000 mg | Freq: Once | ORAL | Status: DC | PRN

## 2022-04-02 MED ORDER — MIDAZOLAM HCL 2 MG/2ML IJ SOLN
INTRAMUSCULAR | Status: AC
  Filled 2022-04-02: qty 2

## 2022-04-02 MED ORDER — PROPOFOL 1000 MG/100ML IV EMUL
INTRAVENOUS | Status: AC
  Filled 2022-04-02: qty 100

## 2022-04-02 MED ORDER — MIDAZOLAM HCL 5 MG/5ML IJ SOLN
INTRAMUSCULAR | Status: DC | PRN
  Administered 2022-04-02: 1 mg via INTRAVENOUS

## 2022-04-02 MED ORDER — ACETAMINOPHEN 10 MG/ML IV SOLN
INTRAVENOUS | Status: AC
  Filled 2022-04-02: qty 100

## 2022-04-02 MED ORDER — FENTANYL CITRATE PF 50 MCG/ML IJ SOSY
25.0000 ug | PREFILLED_SYRINGE | Freq: Once | INTRAMUSCULAR | Status: AC
  Administered 2022-04-02: 50 ug via INTRAVENOUS
  Filled 2022-04-02: qty 2

## 2022-04-02 MED ORDER — PHENYLEPHRINE HCL-NACL 20-0.9 MG/250ML-% IV SOLN
INTRAVENOUS | Status: DC | PRN
  Administered 2022-04-02: 20 ug/min via INTRAVENOUS

## 2022-04-02 MED ORDER — AMLODIPINE BESYLATE 5 MG PO TABS
5.0000 mg | ORAL_TABLET | Freq: Two times a day (BID) | ORAL | Status: DC
  Administered 2022-04-03 – 2022-04-05 (×6): 5 mg via ORAL
  Filled 2022-04-02 (×6): qty 1

## 2022-04-02 MED ORDER — DEXAMETHASONE SODIUM PHOSPHATE 10 MG/ML IJ SOLN
8.0000 mg | Freq: Once | INTRAMUSCULAR | Status: AC
  Administered 2022-04-02: 10 mg via INTRAVENOUS

## 2022-04-02 MED ORDER — BISACODYL 10 MG RE SUPP: 10.0000 mg | Freq: Every day | RECTAL | Status: DC | PRN

## 2022-04-02 MED ORDER — METHOCARBAMOL 500 MG IVPB - SIMPLE MED: 500.0000 mg | Freq: Four times a day (QID) | INTRAVENOUS | Status: DC | PRN

## 2022-04-02 MED ORDER — POTASSIUM 99 MG PO TABS: 99.0000 mg | ORAL_TABLET | ORAL | Status: DC

## 2022-04-02 MED ORDER — TRANEXAMIC ACID-NACL 1000-0.7 MG/100ML-% IV SOLN
1000.0000 mg | Freq: Once | INTRAVENOUS | Status: AC
  Administered 2022-04-02: 1000 mg via INTRAVENOUS
  Filled 2022-04-02: qty 100

## 2022-04-02 MED ORDER — ONDANSETRON HCL 4 MG PO TABS: 4.0000 mg | ORAL_TABLET | Freq: Four times a day (QID) | ORAL | Status: DC | PRN

## 2022-04-02 MED ORDER — CEFAZOLIN SODIUM-DEXTROSE 2-4 GM/100ML-% IV SOLN
2.0000 g | Freq: Four times a day (QID) | INTRAVENOUS | Status: AC
  Administered 2022-04-02 – 2022-04-03 (×2): 2 g via INTRAVENOUS
  Filled 2022-04-02 (×2): qty 100

## 2022-04-02 MED ORDER — ONDANSETRON HCL 4 MG/2ML IJ SOLN: 4.0000 mg | Freq: Four times a day (QID) | INTRAMUSCULAR | Status: DC | PRN

## 2022-04-02 MED ORDER — SODIUM CHLORIDE (PF) 0.9 % IJ SOLN
INTRAMUSCULAR | Status: DC | PRN
  Administered 2022-04-02: 30 mL via INTRAVENOUS

## 2022-04-02 MED ORDER — FENTANYL CITRATE PF 50 MCG/ML IJ SOSY: 25.0000 ug | PREFILLED_SYRINGE | INTRAMUSCULAR | Status: DC | PRN

## 2022-04-02 MED ORDER — ASPIRIN 81 MG PO CHEW
81.0000 mg | CHEWABLE_TABLET | Freq: Two times a day (BID) | ORAL | Status: DC
  Administered 2022-04-03 – 2022-04-05 (×6): 81 mg via ORAL
  Filled 2022-04-02 (×6): qty 1

## 2022-04-02 MED ORDER — SODIUM CHLORIDE 0.9 % IR SOLN
Status: DC | PRN
  Administered 2022-04-02 (×2): 1000 mL

## 2022-04-02 MED ORDER — VANCOMYCIN HCL 1 G IV SOLR
INTRAVENOUS | Status: DC | PRN
  Administered 2022-04-02: 1000 mg

## 2022-04-02 MED ORDER — FENTANYL CITRATE (PF) 100 MCG/2ML IJ SOLN
INTRAMUSCULAR | Status: AC
  Filled 2022-04-02: qty 2

## 2022-04-02 MED ORDER — METOCLOPRAMIDE HCL 5 MG PO TABS: 5.0000 mg | ORAL_TABLET | Freq: Three times a day (TID) | ORAL | Status: DC | PRN

## 2022-04-02 MED ORDER — KETOROLAC TROMETHAMINE 30 MG/ML IJ SOLN
INTRAMUSCULAR | Status: AC
  Filled 2022-04-02: qty 1

## 2022-04-02 MED ORDER — DOCUSATE SODIUM 100 MG PO CAPS
100.0000 mg | ORAL_CAPSULE | Freq: Two times a day (BID) | ORAL | Status: DC
  Administered 2022-04-04: 100 mg via ORAL
  Filled 2022-04-02 (×3): qty 1

## 2022-04-02 MED ORDER — GABAPENTIN 300 MG PO CAPS
300.0000 mg | ORAL_CAPSULE | Freq: Three times a day (TID) | ORAL | Status: DC
  Administered 2022-04-02 – 2022-04-05 (×9): 300 mg via ORAL
  Filled 2022-04-02 (×9): qty 1

## 2022-04-02 MED ORDER — MORPHINE SULFATE (PF) 2 MG/ML IV SOLN: 0.5000 mg | INTRAVENOUS | Status: DC | PRN

## 2022-04-02 MED ORDER — EPINEPHRINE PF 1 MG/ML IJ SOLN
INTRAMUSCULAR | Status: AC
  Filled 2022-04-02: qty 1

## 2022-04-02 MED ORDER — DEXAMETHASONE SODIUM PHOSPHATE 10 MG/ML IJ SOLN
INTRAMUSCULAR | Status: AC
  Filled 2022-04-02: qty 1

## 2022-04-02 MED ORDER — BUPIVACAINE-EPINEPHRINE (PF) 0.25% -1:200000 IJ SOLN
INTRAMUSCULAR | Status: DC | PRN
  Administered 2022-04-02: 30 mL

## 2022-04-02 MED ORDER — DIPHENHYDRAMINE HCL 12.5 MG/5ML PO ELIX: 12.5000 mg | ORAL_SOLUTION | ORAL | Status: DC | PRN

## 2022-04-02 MED ORDER — METHOCARBAMOL 500 MG PO TABS
500.0000 mg | ORAL_TABLET | Freq: Four times a day (QID) | ORAL | Status: DC | PRN
  Administered 2022-04-04: 500 mg via ORAL
  Filled 2022-04-02: qty 1

## 2022-04-02 MED ORDER — HYDROCODONE-ACETAMINOPHEN 5-325 MG PO TABS
1.0000 | ORAL_TABLET | ORAL | Status: DC | PRN
  Administered 2022-04-03 (×2): 2 via ORAL
  Administered 2022-04-03 (×2): 1 via ORAL
  Administered 2022-04-04: 2 via ORAL
  Administered 2022-04-04: 1 via ORAL
  Administered 2022-04-04: 2 via ORAL
  Administered 2022-04-05: 1 via ORAL
  Filled 2022-04-02: qty 1
  Filled 2022-04-02 (×3): qty 2
  Filled 2022-04-02 (×3): qty 1
  Filled 2022-04-02: qty 2

## 2022-04-02 MED ORDER — METOCLOPRAMIDE HCL 5 MG/ML IJ SOLN: 5.0000 mg | Freq: Three times a day (TID) | INTRAMUSCULAR | Status: DC | PRN

## 2022-04-02 MED ORDER — CHLORHEXIDINE GLUCONATE 0.12 % MT SOLN
15.0000 mL | Freq: Once | OROMUCOSAL | Status: AC
  Administered 2022-04-02: 15 mL via OROMUCOSAL

## 2022-04-02 MED ORDER — ACETAMINOPHEN 500 MG PO TABS: 1000.0000 mg | ORAL_TABLET | Freq: Once | ORAL | Status: DC | PRN

## 2022-04-02 MED ORDER — KETOROLAC TROMETHAMINE 30 MG/ML IJ SOLN
INTRAMUSCULAR | Status: DC | PRN
  Administered 2022-04-02: 30 mg via INTRAMUSCULAR

## 2022-04-02 MED ORDER — PROPOFOL 10 MG/ML IV BOLUS
INTRAVENOUS | Status: AC
  Filled 2022-04-02: qty 20

## 2022-04-02 MED ORDER — DEXAMETHASONE SODIUM PHOSPHATE 10 MG/ML IJ SOLN
10.0000 mg | Freq: Once | INTRAMUSCULAR | Status: AC
  Administered 2022-04-03: 10 mg via INTRAVENOUS
  Filled 2022-04-02: qty 1

## 2022-04-02 SURGICAL SUPPLY — 71 items
ADH SKN CLS APL DERMABOND .7 (GAUZE/BANDAGES/DRESSINGS) ×1
ATTUNE DIST FEM SZ5 4 KNEE (Miscellaneous) IMPLANT
ATTUNE MED ANAT PAT 35 KNEE (Knees) IMPLANT
ATUNE TIB SLV M/L 53 (Orthopedic Implant) ×1 IMPLANT
AUG FEM SZ5 4 REV DIST STRL LF (Miscellaneous) ×1 IMPLANT
AUG FEM SZ5 4 REV POST STRL LF (Miscellaneous) ×1 IMPLANT
AUGMENT POST FEM SZ5 4 KNEE (Miscellaneous) IMPLANT
BAG COUNTER SPONGE SURGICOUNT (BAG) IMPLANT
BAG SPEC THK2 15X12 ZIP CLS (MISCELLANEOUS) ×1
BAG SPNG CNTER NS LX DISP (BAG)
BAG ZIPLOCK 12X15 (MISCELLANEOUS) ×1 IMPLANT
BLADE SAW SGTL 11.0X1.19X90.0M (BLADE) ×1 IMPLANT
BLADE SAW SGTL 13.0X1.19X90.0M (BLADE) ×1 IMPLANT
BLADE SAW SGTL 81X20 HD (BLADE) ×1 IMPLANT
BNDG CMPR 5X62 HK CLSR LF (GAUZE/BANDAGES/DRESSINGS) ×1
BNDG ELASTIC 6INX 5YD STR LF (GAUZE/BANDAGES/DRESSINGS) ×1 IMPLANT
BOWL SMART MIX CTS (DISPOSABLE) ×1 IMPLANT
BRUSH FEMORAL CANAL (MISCELLANEOUS) IMPLANT
BSPLAT TIB 4 CMNT REV ROT PLAT (Knees) ×1 IMPLANT
CEMENT HV SMART SET (Cement) ×2 IMPLANT
COMP FEM ATTUNE CRS CEM RT SZ5 (Femur) ×1 IMPLANT
COMPONENT FEM ATN CR CEM RTSZ5 (Femur) IMPLANT
CONE SLEEVE ATTUNE KNEE SM (Sleeve) IMPLANT
COVER SURGICAL LIGHT HANDLE (MISCELLANEOUS) ×1 IMPLANT
CUFF TOURN SGL QUICK 34 (TOURNIQUET CUFF) ×1
CUFF TRNQT CYL 34X4.125X (TOURNIQUET CUFF) ×1 IMPLANT
DERMABOND ADVANCED .7 DNX12 (GAUZE/BANDAGES/DRESSINGS) ×1 IMPLANT
DRAPE INCISE IOBAN 66X45 STRL (DRAPES) ×3 IMPLANT
DRAPE U-SHAPE 47X51 STRL (DRAPES) ×2 IMPLANT
DRESSING AQUACEL AG SP 3.5X10 (GAUZE/BANDAGES/DRESSINGS) IMPLANT
DRSG AQUACEL AG ADV 3.5X14 (GAUZE/BANDAGES/DRESSINGS) IMPLANT
DRSG AQUACEL AG SP 3.5X10 (GAUZE/BANDAGES/DRESSINGS)
DURAPREP 26ML APPLICATOR (WOUND CARE) ×2 IMPLANT
ELECT REM PT RETURN 15FT ADLT (MISCELLANEOUS) ×1 IMPLANT
GLOVE BIO SURGEON STRL SZ 6 (GLOVE) ×1 IMPLANT
GLOVE BIOGEL PI IND STRL 7.5 (GLOVE) ×3 IMPLANT
GLOVE BIOGEL PI IND STRL 8.5 (GLOVE) ×1 IMPLANT
GLOVE ECLIPSE 8.0 STRL XLNG CF (GLOVE) ×2 IMPLANT
GLOVE INDICATOR 6.5 STRL GRN (GLOVE) ×2 IMPLANT
GOWN STRL REUS W/ TWL LRG LVL3 (GOWN DISPOSABLE) ×3 IMPLANT
GOWN STRL REUS W/TWL LRG LVL3 (GOWN DISPOSABLE) ×3
HOLDER FOLEY CATH W/STRAP (MISCELLANEOUS) ×1 IMPLANT
INSERT TIB CMT ATTUNE RP SZ4 (Knees) IMPLANT
INSERT TIB CRS ATTUNE SZ5 8 (Insert) IMPLANT
JET LAVAGE IRRISEPT WOUND (IRRIGATION / IRRIGATOR) ×1
KIT TURNOVER KIT A (KITS) IMPLANT
LAVAGE JET IRRISEPT WOUND (IRRIGATION / IRRIGATOR) ×1 IMPLANT
MANIFOLD NEPTUNE II (INSTRUMENTS) ×1 IMPLANT
NDL SAFETY ECLIP 18X1.5 (MISCELLANEOUS) ×1 IMPLANT
NS IRRIG 1000ML POUR BTL (IV SOLUTION) ×1 IMPLANT
PACK TOTAL KNEE CUSTOM (KITS) ×1 IMPLANT
PIN FIX SIGMA LCS THRD HI (PIN) IMPLANT
PROTECTOR NERVE ULNAR (MISCELLANEOUS) ×1 IMPLANT
RESTRICTOR CEMENT SZ 5 C-STEM (Cement) IMPLANT
SET PAD KNEE POSITIONER (MISCELLANEOUS) ×1 IMPLANT
SLEEVE ATTUNE TIB M/L 53 (Orthopedic Implant) IMPLANT
STAPLER VISISTAT 35W (STAPLE) IMPLANT
STEM CEMT ATTUNE 14X80 (Knees) IMPLANT
STEM STR ATTUNE PF 12X160 (Knees) IMPLANT
SUT MNCRL AB 3-0 PS2 18 (SUTURE) ×1 IMPLANT
SUT STRATAFIX PDS+ 0 24IN (SUTURE) ×1 IMPLANT
SUT VIC AB 1 CT1 36 (SUTURE) ×3 IMPLANT
SUT VIC AB 2-0 CT1 27 (SUTURE) ×3
SUT VIC AB 2-0 CT1 TAPERPNT 27 (SUTURE) ×3 IMPLANT
SWAB COLLECTION DEVICE MRSA (MISCELLANEOUS) IMPLANT
SWAB CULTURE ESWAB REG 1ML (MISCELLANEOUS) IMPLANT
SYR 3ML LL SCALE MARK (SYRINGE) ×1 IMPLANT
TRAY FOLEY MTR SLVR 16FR STAT (SET/KITS/TRAYS/PACK) ×1 IMPLANT
TUBE SUCTION HIGH CAP CLEAR NV (SUCTIONS) ×1 IMPLANT
WATER STERILE IRR 1000ML POUR (IV SOLUTION) ×2 IMPLANT
WRAP KNEE MAXI GEL POST OP (GAUZE/BANDAGES/DRESSINGS) ×1 IMPLANT

## 2022-04-02 NOTE — Anesthesia Procedure Notes (Addendum)
Spinal  Patient location during procedure: OR Start time: 04/02/2022 12:58 PM End time: 04/02/2022 1:06 PM Reason for block: surgical anesthesia Staffing Anesthesiologist: Oleta Mouse, MD Performed by: Oleta Mouse, MD Authorized by: Oleta Mouse, MD   Preanesthetic Checklist Completed: patient identified, IV checked, risks and benefits discussed, surgical consent, monitors and equipment checked, pre-op evaluation and timeout performed Spinal Block Patient position: sitting Prep: DuraPrep Patient monitoring: heart rate, cardiac monitor, continuous pulse ox and blood pressure Approach: midline Location: L4-5 Injection technique: single-shot Needle Needle type: Pencan  Needle gauge: 24 G Needle length: 9 cm Assessment Sensory level: T6 Events: CSF return

## 2022-04-02 NOTE — Plan of Care (Signed)

## 2022-04-02 NOTE — Anesthesia Procedure Notes (Signed)
Date/Time: 04/02/2022 1:00 PM  Performed by: Sharlette Dense, CRNAOxygen Delivery Method: Simple face mask

## 2022-04-02 NOTE — Plan of Care (Signed)
  Problem: Pain Management: Goal: Pain level will decrease with appropriate interventions Outcome: Progressing   Problem: Nutrition: Goal: Adequate nutrition will be maintained Outcome: Progressing   

## 2022-04-02 NOTE — Brief Op Note (Signed)
04/02/2022  3:06 PM  PATIENT:  Susan Turner  77 y.o. female  PRE-OPERATIVE DIAGNOSIS:  Status post resection of right total knee arthroplasty with placement of articulating antibiotic spacer  POST-OPERATIVE DIAGNOSIS:  Status post resection of right total knee arthroplasty with placement of articulating antibiotic spacer  PROCEDURE:  Procedure(s): REIMPLANTATION OF TOTAL KNEE AND REMOVAL OF ANTIBIOTIC SPACER (Right)  SURGEON:  Surgeon(s) and Role:    Paralee Cancel, MD - Primary  PHYSICIAN ASSISTANT: Costella Hatcher, PA-C  ANESTHESIA:   regional and spinal  EBL:  250 mL   BLOOD ADMINISTERED:none  DRAINS: none   LOCAL MEDICATIONS USED:  MARCAINE     SPECIMEN:  No Specimen  DISPOSITION OF SPECIMEN:  N/A  COUNTS:  YES  TOURNIQUET:   Total Tourniquet Time Documented: Thigh (Right) - 68 minutes Total: Thigh (Right) - 68 minutes   DICTATION: .Other Dictation: Dictation Number [id number]  PLAN OF CARE: Admit to inpatient   PATIENT DISPOSITION:  PACU - hemodynamically stable.   Delay start of Pharmacological VTE agent (>24hrs) due to surgical blood loss or risk of bleeding: no

## 2022-04-02 NOTE — Discharge Instructions (Signed)

## 2022-04-02 NOTE — Op Note (Unsigned)
NAME: Susan Turner, Susan Turner MEDICAL RECORD NO: [id number] ACCOUNT NO: [id number] DATE OF BIRTH: [birth year] FACILITY: [facility]ATION: [id number] PHYSICIAN: Pietro Cassis. 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  PROCEDURE: 1.  Resection of previously placed articulating antibiotic spacer. 2.  Revision/reimplantation right total knee arthroplasty.  COMPONENTS USED:  DePuy Attune revision knee system with a size right 5 femoral component with a 14 x 80 cemented stem, a small femoral cone.  On the femoral side, I used 4 mm distal medial augment as well as a 4 mm posterior medial augment.  On the  tibia side, we used a size 4 revision tibial tray with a size 53 porous coated sleeve with a 12 x 60 mm press-fit stem.  We used a 8 mm revision polyethylene insert to match the size 5 femur.  SURGEON:  Pietro Cassis. Alvan Dame, MD  ASSISTANT:  Costella Hatcher, PA-C.  Note that [name] was present for the entirety of the case from preoperative positioning, perioperative management of the operative extremity and general facilitation of the case.  ANESTHESIA:  Regional plus spinal block anesthesia.  BLOOD LOSS:  Less than 200 mL.  TOURNIQUET:  Up for 68 minutes at 250 mmHg.  DRAINS:  None.  COMPLICATIONS:  None.  INDICATIONS FOR PROCEDURE:  The patient is a 77 year old female referred for management of her right knee.  She had a history of right total knee arthroplasty.  She presented with pain and swelling.  Workup was consistent for infection.  She underwent a  resection of her infected right total knee arthroplasty with placement of an  articulating antibiotic spacer.  She received 6 weeks of IV antibiotics.  We then followed her progress and watched her laboratory markers.  Clinically, we were convinced that  she was free of infection and thus she was scheduled for reimplantation.  The risks of recurrent infection, DVT, component failure, need for future surgeries were discussed and reviewed.  The postoperative course was reviewed and expectations.  Consent  was obtained for the benefit of management of her infection during this 2-stage process.  DESCRIPTION OF PROCEDURE:  The patient was brought to the operative theater.  Once adequate anesthesia, preoperative antibiotics, Ancef administered as well as tranexamic acid and Decadron she was positioned supine with a right thigh tourniquet placed.   The right lower extremity was then prepped and draped in sterile fashion.  A timeout was performed identifying the patient, planned procedure, and extremity.  The leg was exsanguinated and tourniquet elevated to 250 mmHg.  Her old incision was utilized  and extended slightly proximal and distal for exposure purposes.  Soft tissue planes were created.  Median arthrotomy was then made.  She was noted to have noninflammatory, noninfectious appearing synovial fluid that was slight blood-tinged.  Given this  findings, we proceeded with our planned reimplantation.  Following an initial synovectomy and scar debridement medially, laterally and in the suprapatellar pouch, we focused around the patella.  Following debridement of the soft tissue around the patella  I recut very small portion of the patella.  We elected to use a 35 patellar button.  Lug holes were drilled.  A metal shim was placed to protect the patella from retractors  and saw blades during the remainder of the case.  Once this was done, the  patella was subluxated laterally and the knee flexed.  I then used osteotomes and was able to remove the previously placed articulating antibiotic  spacer without any further bone loss.  Once this was done, we debrided all soft tissues and removed any  remaining cement.  I then reamed on the femoral side up to 16 mm to use a 14 mm cemented stem.  On the tibia side she was relatively tight in the metadiaphyseal region and I was only able to ream up to 12 mm.  Based on this, we elected to use a press-fit  stem.  Once this was done, I used a saw just to freshen up the cut on the proximal tibia, that I did intend to leave a tibial component slightly proud with the use of a press-fit sleeve.  Once this was done, I broached from the starting broach up to 53  mm leaving the tibial tray about 5-6 mm proud.  We screwed trial tray into this sleeve.  At this point, we attended to the femur, please note that I sized the tibia to be a size 4.  On the femoral side I selected the size 5 femoral component.  We first  revisited the distal femoral cut with the reamer in place.  The reamer was then removed.  All cuts were then made using the appropriate jigs for size 5 femur, setting the rotation based off the proximal tibial tray.  Once this was done, we prepared the  metaphysis of the femur for femoral cone.  I selected the size small cone.  Once this was done, we did a trial reduction and found that with an 8 mm insert, the knee came to full extension and the extension and flexion gaps appeared to be matched.  Once  this was done, all the trial components were removed and final components were opened.  The final components were configured on the back table under direct supervision.  While this was being done, the knee was irrigated with normal saline solution  including the canals with a canal brush irrigator.  The rest of the knee was irrigated with pulse lavage as well as with Prontosan antimicrobial solution.  We selected a size 5 cement restrictor and placed into the distal femur.  Once the final  components were configured the femoral cone was impacted into  position.  Following this, the tibial component was impacted with good fixation with good rotational control.  At this point, cement was mixed.  The femoral component was then cemented into  place and the knee brought to extension with an 8 mm insert until the cement fully cured.  Once the cement fully cured, we removed any excessive cement.  I then based on the trial reduction selected the revision 8 polyethylene posterior stabilized tibial  insert.  It was placed into the tibial tray.  The knee was reduced.  The tourniquet had been let down after 68 minutes without significant hemostasis required with noted punctate oozing as to be expected.  Following irrigation of remaining fluid as well  as the Prontosan solution we then brought the knee to about 40 degrees of flexion and reapproximated the extensor mechanism using a combination of #1 Vicryl and #1 Stratafix suture.  The remainder of the wound was closed in layers with 2-0 Vicryl and a  running Monocryl stitch.  The wound was clean, dry and dressed sterilely using  surgical glue and Aquacel dressing.  The knee was wrapped in an Ace wrap.  She was then brought to the recovery room in stable condition, tolerating the procedure well.  Postoperatively, she will be up with weightbearing as tolerated to work on range of motion and strength.  Discharge will be dependent on her progress with therapy in the hospital.  We will see her back in the office in 2 weeks for initial postoperative  visit.   PUS D: 04/02/2022 6:01:22 pm T: 04/02/2022 8:58:00 pm  JOB: [id number]

## 2022-04-02 NOTE — Interval H&P Note (Signed)
History and Physical Interval Note:  04/02/2022 12:43 PM  Susan Turner  has presented today for surgery, with the diagnosis of Status post resection of right total knee arthroplasty.  The various methods of treatment have been discussed with the patient and family. After consideration of risks, benefits and other options for treatment, the patient has consented to  Procedure(s): REIMPLANTATION OF TOTAL KNEE AND REMOVAL OF ANTIBIOTIC SPACER (Right) as a surgical intervention.  The patient's history has been reviewed, patient examined, no change in status, stable for surgery.  I have reviewed the patient's chart and labs.  Questions were answered to the patient's satisfaction.     Mauri Pole

## 2022-04-02 NOTE — Anesthesia Procedure Notes (Signed)
Anesthesia Regional Block: Adductor canal block   Pre-Anesthetic Checklist: , timeout performed,  Correct Patient, Correct Site, Correct Laterality,  Correct Procedure, Correct Position, site marked,  Risks and benefits discussed,  Pre-op evaluation,  At surgeon's request and post-op pain management  Laterality: Right and Lower  Prep: chloraprep       Needles:  Injection technique: Single-shot      Needle Length: 9cm  Needle Gauge: 22     Additional Needles: Arrow StimuQuik ECHO Echogenic Stimulating PNB Needle  Procedures:,,,, ultrasound used (permanent image in chart),,    Narrative:  Start time: 04/02/2022 12:14 PM End time: 04/02/2022 12:19 PM Injection made incrementally with aspirations every 5 mL.  Performed by: Personally  Anesthesiologist: Oleta Mouse, MD

## 2022-04-02 NOTE — Transfer of Care (Signed)
Immediate Anesthesia Transfer of Care Note  Patient: Susan Turner  Procedure(s) Performed: REIMPLANTATION OF TOTAL KNEE AND REMOVAL OF ANTIBIOTIC SPACER (Right: Knee)  Patient Location: PACU  Anesthesia Type:Spinal  Level of Consciousness: awake and alert   Airway & Oxygen Therapy: Patient Spontanous Breathing and Patient connected to face mask oxygen  Post-op Assessment: Report given to RN and Post -op Vital signs reviewed and stable  Post vital signs: Reviewed and stable  Last Vitals:  Vitals Value Taken Time  BP 126/78 04/02/22 1535  Temp    Pulse 70 04/02/22 1535  Resp 18 04/02/22 1535  SpO2 98 % 04/02/22 1535  Vitals shown include unvalidated device data.  Last Pain:  Vitals:   04/02/22 1230  PainSc: 2       Patients Stated Pain Goal: 4 ([id number])  Complications: No notable events documented.

## 2022-04-02 NOTE — Anesthesia Preprocedure Evaluation (Signed)
Anesthesia Evaluation  Patient identified by MRN, date of birth, ID band Patient awake    Reviewed: Allergy & Precautions, NPO status , Patient's Chart, lab work & pertinent test results  Airway Mallampati: III  TM Distance: >3 FB Neck ROM: Full    Dental  (+)    Pulmonary neg pulmonary ROS   Pulmonary exam normal breath sounds clear to auscultation       Cardiovascular hypertension, Pt. on medications and Pt. on home beta blockers (-) angina (-) Past MI, (-) Cardiac Stents and (-) CABG + dysrhythmias (RBBB)  Rhythm:Regular Rate:Normal  HLD   Neuro/Psych negative neurological ROS     GI/Hepatic negative GI ROS, Neg liver ROS,,,  Endo/Other  diabetes (not on medications), Type 2    Renal/GU negative Renal ROS     Musculoskeletal  (+) Arthritis ,    Abdominal  (+) + obese  Peds  Hematology  (+) Blood dyscrasia (iron deficiency), anemia Lab Results      Component                Value               Date                      WBC                      6.0                 03/22/2022                HGB                      14.0                03/22/2022                HCT                      42.6                03/22/2022                MCV                      84.0                03/22/2022                PLT                      150                 03/22/2022            Denies blood thinners   Anesthesia Other Findings   Reproductive/Obstetrics                             Anesthesia Physical Anesthesia Plan  ASA: 2  Anesthesia Plan: Spinal and MAC   Post-op Pain Management: Regional block* and Tylenol PO (pre-op)*   Induction:   PONV Risk Score and Plan: 2 and Ondansetron, Propofol infusion and Dexamethasone  Airway Management Planned: Nasal Cannula, Natural Airway and Simple Face Mask  Additional Equipment: None  Intra-op Plan:   Post-operative Plan:   Informed Consent: I  have reviewed the patients History and Physical, chart, labs and discussed the procedure including the risks, benefits and alternatives for the proposed anesthesia with the patient or authorized representative who has indicated his/her understanding and acceptance.     Dental advisory given  Plan Discussed with: CRNA  Anesthesia Plan Comments: (I have discussed risks of neuraxial anesthesia including but not limited to infection, bleeding, nerve injury, back pain, headache, seizures, and failure of block. Patient denies bleeding disorders and is not currently anticoagulated. Labs have been reviewed. Risks and benefits discussed. All patient's questions answered.   Discussed potential risks of nerve blocks including, but not limited to, infection, bleeding, nerve damage, seizures, pneumothorax, respiratory depression, and potential failure of the block. Alternatives to nerve blocks discussed. All questions answered.  Discussed with patient risks of MAC including, but not limited to, minor pain or discomfort, hearing people in the room, and possible need for backup general anesthesia. Risks for general anesthesia also discussed including, but not limited to, sore throat, hoarse voice, chipped/damaged teeth, injury to vocal cords, nausea and vomiting, allergic reactions, lung infection, heart attack, stroke, and death. All questions answered. )        Anesthesia Quick Evaluation

## 2022-04-03 LAB — CBC
HCT: 30 % — ABNORMAL LOW (ref 36.0–46.0)
Hemoglobin: 10.1 g/dL — ABNORMAL LOW (ref 12.0–15.0)
MCH: 28.6 pg (ref 26.0–34.0)
MCHC: 33.7 g/dL (ref 30.0–36.0)
MCV: 85 fL (ref 80.0–100.0)
Platelets: 121 10*3/uL — ABNORMAL LOW (ref 150–400)
RBC: 3.53 MIL/uL — ABNORMAL LOW (ref 3.87–5.11)
RDW: 15.8 % — ABNORMAL HIGH (ref 11.5–15.5)
WBC: 6.9 10*3/uL (ref 4.0–10.5)
nRBC: 0 % (ref 0.0–0.2)

## 2022-04-03 LAB — BASIC METABOLIC PANEL
Anion gap: 9 (ref 5–15)
BUN: 24 mg/dL — ABNORMAL HIGH (ref 8–23)
CO2: 21 mmol/L — ABNORMAL LOW (ref 22–32)
Calcium: 8.4 mg/dL — ABNORMAL LOW (ref 8.9–10.3)
Chloride: 107 mmol/L (ref 98–111)
Creatinine, Ser: 0.8 mg/dL (ref 0.44–1.00)
GFR, Estimated: >60 mL/min (ref >60)
Glucose, Bld: 242 mg/dL — ABNORMAL HIGH (ref 70–99)
Potassium: 3.7 mmol/L (ref 3.5–5.1)
Sodium: 137 mmol/L (ref 135–145)

## 2022-04-03 NOTE — Progress Notes (Signed)
Subjective: 1 Day Post-Op Procedure(s) (LRB): REIMPLANTATION OF TOTAL KNEE AND REMOVAL OF ANTIBIOTIC SPACER (Right) Patient seen in rounds for Dr. Alvan Dame Patient reports pain as 3 on 0-10 scale.   She has not been able to get up with physical therapy yet No events overnight  Objective: Vital signs in last 24 hours: Temp:  [97.4 F (36.3 C)-98 F (36.7 C)] 97.9 F (36.6 C) (03/30 0548) Pulse Rate:  [59-72] 59 (03/30 0548) Resp:  [11-20] 17 (03/30 0548) BP: (110-177)/(57-85) 121/57 (03/30 0548) SpO2:  [92 %-98 %] 96 % (03/30 0548) Weight:  [91.2 kg] 91.2 kg (03/29 1751)  Intake/Output from previous day: 03/29 0701 - 03/30 0700 In: 3629.6 [P.O.:840; I.V.:2289.6; IV Piggyback:500] Out: 1350 [Urine:1100; Blood:250] Intake/Output this shift: No intake/output data recorded.  Recent Labs    04/03/22 0319  HGB 10.1*   Recent Labs    04/03/22 0319  WBC 6.9  RBC 3.53*  HCT 30.0*  PLT 121*   Recent Labs    04/03/22 0319  NA 137  K 3.7  CL 107  CO2 21*  BUN 24*  CREATININE 0.80  GLUCOSE 242*  CALCIUM 8.4*   No results for input(s): "LABPT", "INR" in the last 72 hours.  Neurologically intact Neurovascular intact Sensation intact distally Intact pulses distally Dorsiflexion/Plantar flexion intact Incision: dressing C/D/I Compartment soft   Assessment/Plan: 1 Day Post-Op Procedure(s) (LRB): REIMPLANTATION OF TOTAL KNEE AND REMOVAL OF ANTIBIOTIC SPACER (Right) Up with therapy she can get up weightbearing as tolerated with therapy She will follow-up in the office 2 weeks for her first postop appointment DVT prophylaxis aspirin 81 mg twice daily Leave dressings on dry and intact until postop appointment   Drue Novel, PA-C [facility] with Dr. Theda Sers [phone] 04/03/2022, 8:00 AM

## 2022-04-03 NOTE — Plan of Care (Signed)
  Problem: Education: Goal: Knowledge of the prescribed therapeutic regimen will improve Outcome: Progressing   Problem: Activity: Goal: Ability to avoid complications of mobility impairment will improve Outcome: Progressing   Problem: Pain Management: Goal: Pain level will decrease with appropriate interventions Outcome: Progressing   Problem: Nutrition: Goal: Adequate nutrition will be maintained Outcome: Progressing   Problem: Pain Managment: Goal: General experience of comfort will improve Outcome: Progressing

## 2022-04-03 NOTE — Evaluation (Signed)
Physical Therapy Evaluation Patient Details Name: Susan Turner MRN: [id number] DOB: 1945/04/05 Today's Date: 04/03/2022  History of Present Illness  Pt s/p re-implantation of R TKR and with hx of R TKR, one revision and s/p resection with spacer placement in 12/24  Clinical Impression  Pt  admitted as above and presenting with functional mobility limitations 2* decreased R LE strength/ROM, arthritic L knee, and post op pain.  Pt should progress to dc home with assist of family and PCA.     Recommendations for follow up therapy are one component of a multi-disciplinary discharge planning process, led by the attending physician.  Recommendations may be updated based on patient status, additional functional criteria and insurance authorization.  Follow Up Recommendations       Assistance Recommended at Discharge Frequent or constant Supervision/Assistance  Patient can return home with the following  A little help with walking and/or transfers;A little help with bathing/dressing/bathroom;Assistance with cooking/housework;Assist for transportation;Help with stairs or ramp for entrance    Equipment Recommendations None recommended by PT  Recommendations for Other Services       Functional Status Assessment Patient has had a recent decline in their functional status and demonstrates the ability to make significant improvements in function in a reasonable and predictable amount of time.     Precautions / Restrictions Precautions Precautions: Fall Restrictions Weight Bearing Restrictions: No RLE Weight Bearing: Weight bearing as tolerated      Mobility  Bed Mobility Overal bed mobility: Needs Assistance Bed Mobility: Supine to Sit     Supine to sit: Min assist     General bed mobility comments: Increased time with assist for R LE    Transfers Overall transfer level: Needs assistance Equipment used: Rolling walker (2 wheels) Transfers: Sit to/from Stand Sit to Stand: Min  assist           General transfer comment: cues for LE management and use of UEs to self assist    Ambulation/Gait Ambulation/Gait assistance: Min assist Gait Distance (Feet): 30 Feet Assistive device: Rolling walker (2 wheels) Gait Pattern/deviations: Step-to pattern, Decreased step length - right, Decreased step length - left, Shuffle, Trunk flexed Gait velocity: decr     General Gait Details: cues for sequence, posture and position from [employer]            Wheelchair Mobility    Modified Rankin (Stroke Patients Only)       Balance Overall balance assessment: Needs assistance Sitting-balance support: Feet supported, No upper extremity supported Sitting balance-Leahy Scale: Good     Standing balance support: Bilateral upper extremity supported Standing balance-Leahy Scale: Poor                               Pertinent Vitals/Pain Pain Assessment Pain Assessment: 0-10 Pain Score: 7  Pain Location: R knee Pain Descriptors / Indicators: Aching, Sore Pain Intervention(s): Limited activity within patient's tolerance, Monitored during session, Premedicated before session, Ice applied    Home Living Family/patient expects to be discharged to:: Private residence Living Arrangements: Alone Available Help at Discharge: Family;Available 24 hours/day;Personal care attendant Type of Home: House Home Access: Level entry     Alternate Level Stairs-Number of Steps: 2 Home Layout: Two level Home Equipment: Conservation officer, nature (2 wheels);Wheelchair - manual      Prior Function Prior Level of Function : Independent/Modified Independent             Mobility Comments:  using RW or WC since spacer placement       Hand Dominance        Extremity/Trunk Assessment   Upper Extremity Assessment Upper Extremity Assessment: Generalized weakness    Lower Extremity Assessment Lower Extremity Assessment: RLE deficits/detail RLE Deficits / Details: AAROM  R knee -5-40 with 2+/5 quad strength    Cervical / Trunk Assessment Cervical / Trunk Assessment: Kyphotic  Communication   Communication: No difficulties  Cognition Arousal/Alertness: Awake/alert Behavior During Therapy: WFL for tasks assessed/performed Overall Cognitive Status: Within Functional Limits for tasks assessed                                          General Comments      Exercises Total Joint Exercises Ankle Circles/Pumps: AROM, Both, 15 reps, Supine Quad Sets: AROM, Both, 10 reps, Supine Heel Slides: AAROM, Right, 10 reps, Supine Straight Leg Raises: AAROM, Right, 10 reps, Supine   Assessment/Plan    PT Assessment Patient needs continued PT services  PT Problem List Decreased strength;Decreased range of motion;Decreased activity tolerance;Decreased balance;Decreased knowledge of use of DME;Pain       PT Treatment Interventions DME instruction;Gait training;Stair training;Functional mobility training;Therapeutic activities;Therapeutic exercise;Patient/family education    PT Goals (Current goals can be found in the Care Plan section)  Acute Rehab PT Goals Patient Stated Goal: REgain IND PT Goal Formulation: With patient Time For Goal Achievement: 04/16/22 Potential to Achieve Goals: Good    Frequency 7X/week     Co-evaluation               AM-PAC PT "6 Clicks" Mobility  Outcome Measure Help needed turning from your back to your side while in a flat bed without using bedrails?: A Little Help needed moving from lying on your back to sitting on the side of a flat bed without using bedrails?: A Little Help needed moving to and from a bed to a chair (including a wheelchair)?: A Little Help needed standing up from a chair using your arms (e.g., wheelchair or bedside chair)?: A Little Help needed to walk in hospital room?: A Little Help needed climbing 3-5 steps with a railing? : A Lot 6 Click Score: 17    End of Session Equipment  Utilized During Treatment: Gait belt Activity Tolerance: Patient tolerated treatment well;Patient limited by pain Patient left: in chair;with call bell/phone within reach;with chair alarm set;with nursing/sitter in room Nurse Communication: Mobility status PT Visit Diagnosis: Unsteadiness on feet (R26.81);Difficulty in walking, not elsewhere classified (R26.2)    Time: 0950-1030 PT Time Calculation (min) (ACUTE ONLY): 40 min   Charges:   PT Evaluation $PT Eval Low Complexity: 1 Low PT Treatments $Gait Training: 8-22 mins $Therapeutic Exercise: 8-22 mins        Debe Coder PT Acute Rehabilitation Services Pager [phone] Office [phone]   Dayne Chait 04/03/2022, 12:04 PM

## 2022-04-03 NOTE — Progress Notes (Signed)
Physical Therapy Treatment Patient Details Name: Susan Turner MRN: [id number] DOB: [birth year] Today's Date: 04/03/2022   History of Present Illness Pt s/p re-implantation of R TKR and with hx of R TKR, one revision and s/p resection with spacer placement in 12/24    PT Comments    Pt continues very motivated and up to ambulate increased distance in hall with noted improvement in stability and activity tolerance.   Recommendations for follow up therapy are one component of a multi-disciplinary discharge planning process, led by the attending physician.  Recommendations may be updated based on patient status, additional functional criteria and insurance authorization.  Follow Up Recommendations       Assistance Recommended at Discharge Frequent or constant Supervision/Assistance  Patient can return home with the following A little help with walking and/or transfers;A little help with bathing/dressing/bathroom;Assistance with cooking/housework;Assist for transportation;Help with stairs or ramp for entrance   Equipment Recommendations  None recommended by PT    Recommendations for Other Services       Precautions / Restrictions Precautions Precautions: Fall Restrictions Weight Bearing Restrictions: No RLE Weight Bearing: Weight bearing as tolerated     Mobility  Bed Mobility Overal bed mobility: Needs Assistance Bed Mobility: Supine to Sit     Supine to sit: Min assist     General bed mobility comments: Increased time with assist for R LE    Transfers Overall transfer level: Needs assistance Equipment used: Rolling walker (2 wheels) Transfers: Sit to/from Stand Sit to Stand: Min assist           General transfer comment: cues for LE management and use of UEs to self assist    Ambulation/Gait Ambulation/Gait assistance: Min assist Gait Distance (Feet): 60 Feet Assistive device: Rolling walker (2 wheels) Gait Pattern/deviations: Step-to pattern, Decreased  step length - right, Decreased step length - left, Shuffle, Trunk flexed Gait velocity: decr     General Gait Details: Increased time with cues for sequence, posture and position from [employer]             Wheelchair Mobility    Modified Rankin (Stroke Patients Only)       Balance Overall balance assessment: Needs assistance Sitting-balance support: Feet supported, No upper extremity supported Sitting balance-Leahy Scale: Good     Standing balance support: Bilateral upper extremity supported Standing balance-Leahy Scale: Poor                              Cognition Arousal/Alertness: Awake/alert Behavior During Therapy: WFL for tasks assessed/performed Overall Cognitive Status: Within Functional Limits for tasks assessed                                          Exercises Total Joint Exercises Ankle Circles/Pumps: AROM, Both, 15 reps, Supine Quad Sets: AROM, Both, 10 reps, Supine Heel Slides: AAROM, Right, 10 reps, Supine Straight Leg Raises: AAROM, Right, 10 reps, Supine    General Comments        Pertinent Vitals/Pain Pain Assessment Pain Assessment: 0-10 Pain Score: 7  Pain Location: R knee Pain Descriptors / Indicators: Aching, Sore Pain Intervention(s): Limited activity within patient's tolerance, Monitored during session, Premedicated before session, Ice applied    Home Living  Prior Function            PT Goals (current goals can now be found in the care plan section) Acute Rehab PT Goals Patient Stated Goal: REgain IND PT Goal Formulation: With patient Time For Goal Achievement: 04/16/22 Potential to Achieve Goals: Good Progress towards PT goals: Progressing toward goals    Frequency    7X/week      PT Plan Current plan remains appropriate    Co-evaluation              AM-PAC PT "6 Clicks" Mobility   Outcome Measure  Help needed turning from your back to  your side while in a flat bed without using bedrails?: A Little Help needed moving from lying on your back to sitting on the side of a flat bed without using bedrails?: A Little Help needed moving to and from a bed to a chair (including a wheelchair)?: A Little Help needed standing up from a chair using your arms (e.g., wheelchair or bedside chair)?: A Little Help needed to walk in hospital room?: A Little Help needed climbing 3-5 steps with a railing? : A Lot 6 Click Score: 17    End of Session Equipment Utilized During Treatment: Gait belt Activity Tolerance: Patient tolerated treatment well;Patient limited by pain;Patient limited by fatigue Patient left: in chair;with call bell/phone within reach;with chair alarm set;with nursing/sitter in room Nurse Communication: Mobility status PT Visit Diagnosis: Unsteadiness on feet (R26.81);Difficulty in walking, not elsewhere classified (R26.2)     Time: [id number] PT Time Calculation (min) (ACUTE ONLY): 25 min  Charges:  $Gait Training: 23-37 mins                     [place] Pager [phone] Office [phone]    Susan Turner 04/03/2022, 5:22 PM

## 2022-04-04 LAB — CBC
HCT: 28.1 % — ABNORMAL LOW (ref 36.0–46.0)
Hemoglobin: 9.4 g/dL — ABNORMAL LOW (ref 12.0–15.0)
MCH: 28.7 pg (ref 26.0–34.0)
MCHC: 33.5 g/dL (ref 30.0–36.0)
MCV: 85.7 fL (ref 80.0–100.0)
Platelets: 127 10*3/uL — ABNORMAL LOW (ref 150–400)
RBC: 3.28 MIL/uL — ABNORMAL LOW (ref 3.87–5.11)
RDW: 15.9 % — ABNORMAL HIGH (ref 11.5–15.5)
WBC: 6.6 10*3/uL (ref 4.0–10.5)
nRBC: 0 % (ref 0.0–0.2)

## 2022-04-04 NOTE — Plan of Care (Signed)
  Problem: Education: Goal: Knowledge of the prescribed therapeutic regimen will improve Outcome: Progressing   Problem: Pain Management: Goal: Pain level will decrease with appropriate interventions Outcome: Progressing   Problem: Activity: Goal: Risk for activity intolerance will decrease Outcome: Progressing   

## 2022-04-04 NOTE — Anesthesia Postprocedure Evaluation (Signed)
Anesthesia Post Note  Patient: Susan Turner  Procedure(s) Performed: REIMPLANTATION OF TOTAL KNEE AND REMOVAL OF ANTIBIOTIC SPACER (Right: Knee)     Patient location during evaluation: PACU Anesthesia Type: MAC and Spinal Level of consciousness: awake and alert Pain management: pain level controlled Vital Signs Assessment: post-procedure vital signs reviewed and stable Respiratory status: spontaneous breathing, nonlabored ventilation and respiratory function stable Cardiovascular status: stable and blood pressure returned to baseline Postop Assessment: no apparent nausea or vomiting Anesthetic complications: no   No notable events documented.  Last Vitals:  Vitals:   04/03/22 2111 04/04/22 0455  BP: (!) 148/70 (!) 153/74  Pulse: 67 63  Resp: 18 18  Temp: 36.9 C 36.7 C  SpO2: 99% 96%    Last Pain:  Vitals:   04/04/22 0455  TempSrc: Oral  PainSc:                  Amayrany Cafaro

## 2022-04-04 NOTE — Progress Notes (Signed)
Physical Therapy Treatment Patient Details Name: Susan Turner MRN: [id number] DOB: 1945-10-30 Today's Date: 04/04/2022   History of Present Illness Pt s/p re-implantation of R TKR and with hx of R TKR, one revision and s/p resection with spacer placement in 12/24    PT Comments    Pt continues very motivated and progressing steadily with mobility with noted increased activity tolerance and decreased level of assist for most tasks.  Pt hopeful for dc home tomorrow but unclear on when OP PT is scheduled.   Recommendations for follow up therapy are one component of a multi-disciplinary discharge planning process, led by the attending physician.  Recommendations may be updated based on patient status, additional functional criteria and insurance authorization.  Follow Up Recommendations       Assistance Recommended at Discharge Frequent or constant Supervision/Assistance  Patient can return home with the following A little help with walking and/or transfers;A little help with bathing/dressing/bathroom;Assistance with cooking/housework;Assist for transportation;Help with stairs or ramp for entrance   Equipment Recommendations  None recommended by PT    Recommendations for Other Services       Precautions / Restrictions Precautions Precautions: Fall Restrictions Weight Bearing Restrictions: No RLE Weight Bearing: Weight bearing as tolerated     Mobility  Bed Mobility Overal bed mobility: Needs Assistance Bed Mobility: Sit to Supine       Sit to supine: Min guard   General bed mobility comments: Increased time with pt assisting R LE with gait belt    Transfers Overall transfer level: Needs assistance Equipment used: Rolling walker (2 wheels) Transfers: Sit to/from Stand Sit to Stand: Min guard           General transfer comment: cues for LE management and use of UEs to self assist    Ambulation/Gait Ambulation/Gait assistance: Min guard Gait Distance (Feet):  115 Feet Assistive device: Rolling walker (2 wheels) Gait Pattern/deviations: Step-to pattern, Decreased step length - right, Decreased step length - left, Shuffle, Trunk flexed Gait velocity: decr     General Gait Details: Increased time with cues for sequence, posture and position from [employer]             Wheelchair Mobility    Modified Rankin (Stroke Patients Only)       Balance Overall balance assessment: Needs assistance Sitting-balance support: Feet supported, No upper extremity supported Sitting balance-Leahy Scale: Good     Standing balance support: Single extremity supported Standing balance-Leahy Scale: Poor                              Cognition Arousal/Alertness: Awake/alert Behavior During Therapy: WFL for tasks assessed/performed Overall Cognitive Status: Within Functional Limits for tasks assessed                                          Exercises Total Joint Exercises Ankle Circles/Pumps: AROM, Both, 15 reps, Supine Quad Sets: AROM, Both, 10 reps, Supine Heel Slides: AAROM, Right, Supine, 15 reps Hip ABduction/ADduction: AAROM, Right, 15 reps, Supine Straight Leg Raises: AAROM, Right, Supine, 15 reps Goniometric ROM: -5 - 40 AAROM at R knee    General Comments        Pertinent Vitals/Pain Pain Assessment Pain Assessment: 0-10 Pain Score: 5  Pain Location: R knee Pain Descriptors / Indicators: Aching, Sore Pain Intervention(s): Limited  activity within patient's tolerance, Monitored during session, Premedicated before session, Ice applied    Home Living                          Prior Function            PT Goals (current goals can now be found in the care plan section) Acute Rehab PT Goals Patient Stated Goal: REgain IND PT Goal Formulation: With patient Time For Goal Achievement: 04/16/22 Potential to Achieve Goals: Good Progress towards PT goals: Progressing toward goals     Frequency    7X/week      PT Plan Current plan remains appropriate    Co-evaluation              AM-PAC PT "6 Clicks" Mobility   Outcome Measure  Help needed turning from your back to your side while in a flat bed without using bedrails?: A Little Help needed moving from lying on your back to sitting on the side of a flat bed without using bedrails?: A Little Help needed moving to and from a bed to a chair (including a wheelchair)?: A Little Help needed standing up from a chair using your arms (e.g., wheelchair or bedside chair)?: A Little Help needed to walk in hospital room?: A Little Help needed climbing 3-5 steps with a railing? : A Lot 6 Click Score: 17    End of Session Equipment Utilized During Treatment: Gait belt Activity Tolerance: Patient tolerated treatment well Patient left: in bed;with call bell/phone within reach;with bed alarm set Nurse Communication: Mobility status PT Visit Diagnosis: Unsteadiness on feet (R26.81);Difficulty in walking, not elsewhere classified (R26.2)     Time: [id number] PT Time Calculation (min) (ACUTE ONLY): 38 min  Charges:  $Gait Training: 23-37 mins $Therapeutic Exercise: 8-22 mins                     Debe Coder PT Acute Rehabilitation Services Pager [phone] Office [phone]    Leor Whyte 04/04/2022, 12:21 PM

## 2022-04-04 NOTE — Plan of Care (Signed)
  Problem: Education: Goal: Knowledge of the prescribed therapeutic regimen will improve Outcome: Progressing   Problem: Activity: Goal: Ability to avoid complications of mobility impairment will improve Outcome: Progressing Goal: Range of joint motion will improve Outcome: Progressing   Problem: Activity: Goal: Range of joint motion will improve Outcome: Progressing   Problem: Activity: Goal: Risk for activity intolerance will decrease Outcome: Progressing   Problem: Nutrition: Goal: Adequate nutrition will be maintained Outcome: Progressing   Problem: Pain Managment: Goal: General experience of comfort will improve Outcome: Progressing

## 2022-04-04 NOTE — Progress Notes (Signed)
Physical Therapy Treatment Patient Details Name: Susan Turner MRN: [id number] DOB: 09/11/1945 Today's Date: 04/04/2022   History of Present Illness Pt s/p re-implantation of R TKR and with hx of R TKR, one revision and s/p resection with spacer placement in 12/24    PT Comments    Pt continues very cooperative and progressing well with mobility.  Pt up to bathroom for toileting and up to ambulate in halls with increased time requiring but min guard to Sup assist only.  Pt hopeful for dc home tomorrow.   Recommendations for follow up therapy are one component of a multi-disciplinary discharge planning process, led by the attending physician.  Recommendations may be updated based on patient status, additional functional criteria and insurance authorization.  Follow Up Recommendations       Assistance Recommended at Discharge Frequent or constant Supervision/Assistance  Patient can return home with the following A little help with walking and/or transfers;A little help with bathing/dressing/bathroom;Assistance with cooking/housework;Assist for transportation;Help with stairs or ramp for entrance   Equipment Recommendations  None recommended by PT    Recommendations for Other Services       Precautions / Restrictions Precautions Precautions: Fall Restrictions Weight Bearing Restrictions: No RLE Weight Bearing: Weight bearing as tolerated     Mobility  Bed Mobility Overal bed mobility: Needs Assistance Bed Mobility: Supine to Sit, Sit to Supine     Supine to sit: Min guard Sit to supine: Min guard   General bed mobility comments: Increased time with pt assisting R LE with gait belt    Transfers Overall transfer level: Needs assistance Equipment used: Rolling walker (2 wheels) Transfers: Sit to/from Stand Sit to Stand: Min guard           General transfer comment: cues for LE management and use of UEs to self assist    Ambulation/Gait Ambulation/Gait  assistance: Min guard, Supervision Gait Distance (Feet): 115 Feet (and 15' into bathroom for toileting) Assistive device: Rolling walker (2 wheels) Gait Pattern/deviations: Step-to pattern, Decreased step length - right, Decreased step length - left, Shuffle, Trunk flexed Gait velocity: decr     General Gait Details: Increased time with cues for sequence, posture and position from [employer]             Wheelchair Mobility    Modified Rankin (Stroke Patients Only)       Balance Overall balance assessment: Needs assistance Sitting-balance support: Feet supported, No upper extremity supported Sitting balance-Leahy Scale: Good     Standing balance support: No upper extremity supported Standing balance-Leahy Scale: Fair                              Cognition Arousal/Alertness: Awake/alert Behavior During Therapy: WFL for tasks assessed/performed Overall Cognitive Status: Within Functional Limits for tasks assessed                                          Exercises Total Joint Exercises Ankle Circles/Pumps: AROM, Both, 15 reps, Supine Quad Sets: AROM, Both, 10 reps, Supine Heel Slides: AAROM, Right, Supine, 15 reps Hip ABduction/ADduction: AAROM, Right, 15 reps, Supine Straight Leg Raises: AAROM, Right, Supine, 15 reps Goniometric ROM: -5 - 40 AAROM at R knee    General Comments        Pertinent Vitals/Pain Pain Assessment Pain Assessment: 0-10  Pain Score: 5  Pain Location: R knee Pain Descriptors / Indicators: Aching, Sore Pain Intervention(s): Limited activity within patient's tolerance, Monitored during session, Premedicated before session, Ice applied    Home Living                          Prior Function            PT Goals (current goals can now be found in the care plan section) Acute Rehab PT Goals Patient Stated Goal: REgain IND PT Goal Formulation: With patient Time For Goal Achievement:  04/16/22 Potential to Achieve Goals: Good Progress towards PT goals: Progressing toward goals    Frequency    7X/week      PT Plan Current plan remains appropriate    Co-evaluation              AM-PAC PT "6 Clicks" Mobility   Outcome Measure  Help needed turning from your back to your side while in a flat bed without using bedrails?: A Little Help needed moving from lying on your back to sitting on the side of a flat bed without using bedrails?: A Little Help needed moving to and from a bed to a chair (including a wheelchair)?: A Little Help needed standing up from a chair using your arms (e.g., wheelchair or bedside chair)?: A Little Help needed to walk in hospital room?: A Little Help needed climbing 3-5 steps with a railing? : A Lot 6 Click Score: 17    End of Session Equipment Utilized During Treatment: Gait belt Activity Tolerance: Patient tolerated treatment well Patient left: in bed;with call bell/phone within reach;with bed alarm set;with family/visitor present Nurse Communication: Mobility status PT Visit Diagnosis: Unsteadiness on feet (R26.81);Difficulty in walking, not elsewhere classified (R26.2)     Time: [id number] PT Time Calculation (min) (ACUTE ONLY): 27 min  Charges:  $Gait Training: 8-22 mins $Therapeutic Exercise: 8-22 mins $Therapeutic Activity: 8-22 mins                     [place] Pager [phone] Office [phone]    Kavari Parrillo 04/04/2022, 2:23 PM

## 2022-04-04 NOTE — Progress Notes (Signed)
Subjective: 2 Days Post-Op Procedure(s) (LRB): REIMPLANTATION OF TOTAL KNEE AND REMOVAL OF ANTIBIOTIC SPACER (Right)  Patient reports pain as mild to moderate.  Reports that pain is improving.  Denies fever, chills, N/V, CP, SOB.  Tolerating POs well.  Admits to flatus.  Reports that she is working well with PT.  Hopeful to go home tomorrow.  Objective:   VITALS:  Temp:  [98.1 F (36.7 C)-99 F (37.2 C)] 98.1 F (36.7 C) (03/31 0455) Pulse Rate:  [63-72] 63 (03/31 0455) Resp:  [14-18] 18 (03/31 0455) BP: (129-153)/(57-74) 153/74 (03/31 0455) SpO2:  [95 %-99 %] 96 % (03/31 0455)  General: WDWN patient in NAD. Psych:  Appropriate mood and affect. Neuro:  A&O x 3, Moving all extremities, sensation intact to light touch HEENT:  EOMs intact Chest:  Even non-labored respirations Skin: Dressing  C/D/I, no rashes or lesions Extremities: warm/dry, mild edema to R knee, no erythema or echymosis.  No lymphadenopathy. Pulses: Popliteus 2+  MSK:  ROM: lacks 5 degrees TKE, MMT: able to perform quad set, (-) Homan's    LABS Recent Labs    04/03/22 0319 04/04/22 0340  HGB 10.1* 9.4*  WBC 6.9 6.6  PLT 121* 127*   Recent Labs    04/03/22 0319  NA 137  K 3.7  CL 107  CO2 21*  BUN 24*  CREATININE 0.80  GLUCOSE 242*   No results for input(s): "LABPT", "INR" in the last 72 hours.   Assessment/Plan: 2 Days Post-Op Procedure(s) (LRB): REIMPLANTATION OF TOTAL KNEE AND REMOVAL OF ANTIBIOTIC SPACER (Right)  Patient seen in rounds for Dr. Silvano Bilis Up with therapy DVT ppx: ASA Leave dressings intact until outpatient post-op appointment. Patient staying at least another night.   Mechele Claude PA-C [facility] Office:  [phone]

## 2022-04-05 MED ORDER — METHOCARBAMOL 500 MG PO TABS: 500.0000 mg | ORAL_TABLET | Freq: Four times a day (QID) | ORAL | 2 refills | Status: DC | PRN

## 2022-04-05 MED ORDER — HYDROCODONE-ACETAMINOPHEN 5-325 MG PO TABS: 1.0000 | ORAL_TABLET | ORAL | 0 refills | Status: DC | PRN

## 2022-04-05 MED ORDER — ASPIRIN 81 MG PO CHEW: 81.0000 mg | CHEWABLE_TABLET | Freq: Two times a day (BID) | ORAL | 0 refills | Status: AC

## 2022-04-05 MED ORDER — POLYETHYLENE GLYCOL 3350 17 G PO PACK: 17.0000 g | PACK | Freq: Two times a day (BID) | ORAL | 0 refills | Status: AC

## 2022-04-05 NOTE — Progress Notes (Signed)
   Subjective: 3 Days Post-Op Procedure(s) (LRB): REIMPLANTATION OF TOTAL KNEE AND REMOVAL OF ANTIBIOTIC SPACER (Right) Patient reports pain as mild.   Patient seen in rounds for Dr. Alvan Dame. Patient is well, and has had no acute complaints or problems. No acute events overnight. Foley catheter removed. Patient ambulated 115 feet with PT.  We will start therapy today.   Objective: Vital signs in last 24 hours: Temp:  [98 F (36.7 C)-98.4 F (36.9 C)] 98 F (36.7 C) (04/01 0646) Pulse Rate:  [64-71] 64 (04/01 0646) Resp:  [16-18] 16 (04/01 0646) BP: (126-140)/(57-82) 130/82 (04/01 0646) SpO2:  [93 %-98 %] 96 % (04/01 0646)  Intake/Output from previous day:  Intake/Output Summary (Last 24 hours) at 04/05/2022 0732 Last data filed at 04/04/2022 1323 Gross per 24 hour  Intake 360 ml  Output --  Net 360 ml     Intake/Output this shift: No intake/output data recorded.  Labs: Recent Labs    04/03/22 0319 04/04/22 0340  HGB 10.1* 9.4*   Recent Labs    04/03/22 0319 04/04/22 0340  WBC 6.9 6.6  RBC 3.53* 3.28*  HCT 30.0* 28.1*  PLT 121* 127*   Recent Labs    04/03/22 0319  NA 137  K 3.7  CL 107  CO2 21*  BUN 24*  CREATININE 0.80  GLUCOSE 242*  CALCIUM 8.4*   No results for input(s): "LABPT", "INR" in the last 72 hours.  Exam: General - Patient is Alert and Oriented Extremity - Neurologically intact Sensation intact distally Intact pulses distally Dorsiflexion/Plantar flexion intact Dressing - dressing C/D/I Motor Function - intact, moving foot and toes well on exam.   Past Medical History:  Diagnosis Date   Anemia    Arthritis    Carpal tunnel syndrome on both sides    Gout    Hyperlipidemia    Hypertension    Iron deficiency anemia    Lipoma of back    Pneumonia    Pre-diabetes    URI (upper respiratory infection)    Weight loss     Assessment/Plan: 3 Days Post-Op Procedure(s) (LRB): REIMPLANTATION OF TOTAL KNEE AND REMOVAL OF ANTIBIOTIC  SPACER (Right) Principal Problem:   S/P revision of total knee, right  Estimated body mass index is 33.45 kg/m as calculated from the following:   Height as of this encounter: 5\' 5"  (1.651 m).   Weight as of this encounter: 91.2 kg. Advance diet Up with therapy D/C IV fluids   DVT Prophylaxis - Aspirin Weight bearing as tolerated.  Plan is to go Home after hospital stay. Plan for discharge today following 1-2 sessions of PT as long as they are meeting their goals. Patient is scheduled for OPPT. Follow up in the office in 2 weeks.   Griffith Citron, PA-C Orthopedic Surgery [phone] 04/05/2022, 7:32 AM

## 2022-04-05 NOTE — TOC Transition Note (Signed)
Transition of Care [facility]) - CM/SW Discharge Note  Patient Details  Name: Susan Turner MRN: [id number] Date of Birth: 10/05/1945  [facility] CM/SW Contact:  Sherie Don, LCSW Phone Number: 04/05/2022, 9:52 AM  Clinical Narrative: Patient is expected to discharge home after working with PT. CSW met with patient to confirm discharge plan. Patient will go home with OPPT at [facility]. Patient has a rolling walker and wheelchair at home, so there are no DME needs at this time. TOC signing off.   Final next level of care: OP Rehab Barriers to Discharge: No Barriers Identified  Patient Goals and CMS Choice Choice offered to / list presented to : NA  Discharge Plan and Services Additional resources added to the After Visit Summary for         DME Arranged: N/A DME Agency: NA  Social Determinants of Health (SDOH) Interventions SDOH Screenings   Food Insecurity: No Food Insecurity (04/02/2022)  Housing: Low Risk  (04/02/2022)  Transportation Needs: No Transportation Needs (04/02/2022)  Utilities: Not At Risk (04/02/2022)  Depression (PHQ2-9): Low Risk  (01/19/2022)  Tobacco Use: Low Risk  (04/02/2022)   Readmission Risk Interventions     No data to display

## 2022-04-05 NOTE — Progress Notes (Signed)
Physical Therapy Treatment Patient Details Name: Susan Turner MRN: [id number] DOB: Jan 14, 1945 Today's Date: 04/05/2022   History of Present Illness Pt s/p re-implantation of R TKR and with hx of R TKR, one revision and s/p resection with spacer placement in 12/24    PT Comments    Pt progressing steadily. Feels ready to d/c. Gait pattern and wt shift to RLE improving, pt reports minimal knee pain, distance limited by pt c/o pain at IV site with use of RW. Pt to have f/u OPPT  Recommendations for follow up therapy are one component of a multi-disciplinary discharge planning process, led by the attending physician.  Recommendations may be updated based on patient status, additional functional criteria and insurance authorization.  Follow Up Recommendations       Assistance Recommended at Discharge Frequent or constant Supervision/Assistance  Patient can return home with the following A little help with walking and/or transfers;A little help with bathing/dressing/bathroom;Assistance with cooking/housework;Assist for transportation;Help with stairs or ramp for entrance   Equipment Recommendations  None recommended by PT    Recommendations for Other Services       Precautions / Restrictions Precautions Precautions: Fall;Knee Precaution Comments: reviewed no pillow under knee Restrictions Weight Bearing Restrictions: No RLE Weight Bearing: Weight bearing as tolerated     Mobility  Bed Mobility Overal bed mobility: Needs Assistance Bed Mobility: Supine to Sit, Sit to Supine     Supine to sit: Supervision     General bed mobility comments: Increased time with pt assisting R LE with gait belt    Transfers Overall transfer level: Needs assistance Equipment used: Rolling walker (2 wheels) Transfers: Sit to/from Stand Sit to Stand: Min guard           General transfer comment: cues for LE management and use of UEs to self assist    Ambulation/Gait Ambulation/Gait  assistance: Supervision Gait Distance (Feet): 45 Feet Assistive device: Rolling walker (2 wheels) Gait Pattern/deviations: Step-to pattern, Trunk flexed, Decreased stance time - right, Step-through pattern Gait velocity: decr     General Gait Details: cues for initial sequence, improving wt shift to RLE, no LOB  and no knee buckling   Stairs             Wheelchair Mobility    Modified Rankin (Stroke Patients Only)       Balance Overall balance assessment: Needs assistance Sitting-balance support: Feet supported, No upper extremity supported Sitting balance-Leahy Scale: Good     Standing balance support: No upper extremity supported Standing balance-Leahy Scale: Fair                              Cognition Arousal/Alertness: Awake/alert Behavior During Therapy: WFL for tasks assessed/performed Overall Cognitive Status: Within Functional Limits for tasks assessed                                          Exercises Total Joint Exercises Ankle Circles/Pumps: AROM, Both, 10 reps Quad Sets: AROM, Both, 5 reps    General Comments        Pertinent Vitals/Pain Pain Assessment Pain Assessment: 0-10 Pain Score: 2  Pain Location: R knee Pain Descriptors / Indicators: Sore, Burning Pain Intervention(s): Limited activity within patient's tolerance, Monitored during session, Premedicated before session, Repositioned    Home Living  Prior Function            PT Goals (current goals can now be found in the care plan section) Acute Rehab PT Goals Patient Stated Goal: REgain IND PT Goal Formulation: With patient Time For Goal Achievement: 04/16/22 Potential to Achieve Goals: Good Progress towards PT goals: Progressing toward goals    Frequency    7X/week      PT Plan Current plan remains appropriate    Co-evaluation              AM-PAC PT "6 Clicks" Mobility   Outcome Measure  Help  needed turning from your back to your side while in a flat bed without using bedrails?: A Little Help needed moving from lying on your back to sitting on the side of a flat bed without using bedrails?: A Little Help needed moving to and from a bed to a chair (including a wheelchair)?: A Little Help needed standing up from a chair using your arms (e.g., wheelchair or bedside chair)?: A Little Help needed to walk in hospital room?: A Little Help needed climbing 3-5 steps with a railing? : A Lot 6 Click Score: 17    End of Session Equipment Utilized During Treatment: Gait belt Activity Tolerance: Patient tolerated treatment well Patient left: with call bell/phone within reach;in chair;with chair alarm set Nurse Communication: Mobility status PT Visit Diagnosis: Unsteadiness on feet (R26.81);Difficulty in walking, not elsewhere classified (R26.2)     Time: [id number] PT Time Calculation (min) (ACUTE ONLY): 15 min  Charges:  $Gait Training: 8-22 mins                     Susan Turner, PT  Acute Rehab Dept ([place]) [phone]  04/05/2022    [facility] 04/05/2022, 10:14 AM

## 2022-04-06 ENCOUNTER — Encounter (HOSPITAL_COMMUNITY): Payer: Self-pay | Admitting: Orthopedic Surgery

## 2022-04-12 DIAGNOSIS — E7800 Pure hypercholesterolemia, unspecified: Secondary | ICD-10-CM | POA: Diagnosis not present

## 2022-04-12 DIAGNOSIS — E1169 Type 2 diabetes mellitus with other specified complication: Secondary | ICD-10-CM | POA: Diagnosis not present

## 2022-04-12 DIAGNOSIS — D509 Iron deficiency anemia, unspecified: Secondary | ICD-10-CM | POA: Diagnosis not present

## 2022-04-12 DIAGNOSIS — Z1331 Encounter for screening for depression: Secondary | ICD-10-CM | POA: Diagnosis not present

## 2022-04-12 DIAGNOSIS — E119 Type 2 diabetes mellitus without complications: Secondary | ICD-10-CM | POA: Diagnosis not present

## 2022-04-12 DIAGNOSIS — Z0000 Encounter for general adult medical examination without abnormal findings: Secondary | ICD-10-CM | POA: Diagnosis not present

## 2022-04-12 DIAGNOSIS — I10 Essential (primary) hypertension: Secondary | ICD-10-CM | POA: Diagnosis not present

## 2022-04-12 DIAGNOSIS — M25561 Pain in right knee: Secondary | ICD-10-CM | POA: Diagnosis not present

## 2022-04-12 DIAGNOSIS — I77819 Aortic ectasia, unspecified site: Secondary | ICD-10-CM | POA: Diagnosis not present

## 2022-04-14 ENCOUNTER — Other Ambulatory Visit: Payer: Self-pay | Admitting: Family Medicine

## 2022-04-14 DIAGNOSIS — M25511 Pain in right shoulder: Secondary | ICD-10-CM

## 2022-04-14 NOTE — Discharge Summary (Signed)
Patient ID: Susan Turner MRN: [id number] DOB/AGE: [birth year] 77 y.o.  Admit date: 04/02/2022 Discharge date: 04/05/2022  Admission Diagnoses:  History of right total knee resection with placement of antibiotic spacer  Discharge Diagnoses:  Principal Problem:   S/P revision of total knee, right   Past Medical History:  Diagnosis Date   Anemia    Arthritis    Carpal tunnel syndrome on both sides    Gout    Hyperlipidemia    Hypertension    Iron deficiency anemia    Lipoma of back    Pneumonia    Pre-diabetes    URI (upper respiratory infection)    Weight loss     Surgeries: Procedure(s): REIMPLANTATION OF TOTAL KNEE AND REMOVAL OF ANTIBIOTIC SPACER on 04/02/2022   Consultants:   Discharged Condition: Improved  Hospital Course: Susan Turner is an 77 y.o. female who was admitted 04/02/2022 for operative treatment ofS/P revision of total knee, right. Patient has severe unremitting pain that affects sleep, daily activities, and work/hobbies. After pre-op clearance the patient was taken to the operating room on 04/02/2022 and underwent  Procedure(s): REIMPLANTATION OF TOTAL KNEE AND REMOVAL OF ANTIBIOTIC SPACER.    Patient was given perioperative antibiotics:  Anti-infectives (From admission, onward)    Start     Dose/Rate Route Frequency Ordered Stop   04/02/22 2000  ceFAZolin (ANCEF) IVPB 2g/100 mL premix        2 g 200 mL/hr over 30 Minutes Intravenous Every 6 hours 04/02/22 1647 04/03/22 0236   04/02/22 1359  vancomycin (VANCOCIN) powder  Status:  Discontinued          As needed 04/02/22 1359 04/02/22 1647   04/02/22 1100  ceFAZolin (ANCEF) IVPB 2g/100 mL premix        2 g 200 mL/hr over 30 Minutes Intravenous On call to O.R. 04/02/22 1047 04/02/22 1313        Patient was given sequential compression devices, early ambulation, and chemoprophylaxis to prevent DVT. Patient worked with PT and was meeting their goals regarding safe ambulation and  transfers.  Patient benefited maximally from hospital stay and there were no complications.    Recent vital signs: No data found.   Recent laboratory studies: No results for input(s): "WBC", "HGB", "HCT", "PLT", "NA", "K", "CL", "CO2", "BUN", "CREATININE", "GLUCOSE", "INR", "CALCIUM" in the last 72 hours.  Invalid input(s): "PT", "2"   Discharge Medications:   Allergies as of 04/05/2022       Reactions   Lisinopril Cough   Codeine Other (See Comments)   Tachycardia    Motrin [ibuprofen]    Pt does not remember reaction         Medication List     STOP taking these medications    acetaminophen 500 MG tablet Commonly known as: TYLENOL       TAKE these medications    amLODipine 5 MG tablet Commonly known as: NORVASC Take 5 mg by mouth 2 (two) times daily.   aspirin 81 MG chewable tablet Chew 1 tablet (81 mg total) by mouth 2 (two) times daily for 28 days.   calcium carbonate 500 MG chewable tablet Commonly known as: TUMS - dosed in mg elemental calcium Chew 2 tablets by mouth daily as needed for indigestion or heartburn.   ferrous sulfate dried 160 (50 Fe) MG Tbcr SR tablet Commonly known as: SLOW FE Take 160 mg by mouth once a week.   gabapentin 300 MG capsule Commonly known as: NEURONTIN Take 300  mg by mouth 3 (three) times daily.   HYDROcodone-acetaminophen 5-325 MG tablet Commonly known as: NORCO/VICODIN Take 1 tablet by mouth every 4 (four) hours as needed for severe pain.   irbesartan 300 MG tablet Commonly known as: AVAPRO Take 300 mg by mouth daily.   methocarbamol 500 MG tablet Commonly known as: ROBAXIN Take 1 tablet (500 mg total) by mouth every 6 (six) hours as needed for muscle spasms.   metoprolol tartrate 100 MG tablet Commonly known as: LOPRESSOR Take 100 mg by mouth 2 (two) times daily.   polyethylene glycol 17 g packet Commonly known as: MIRALAX / GLYCOLAX Take 17 g by mouth 2 (two) times daily. What changed:  when to take  this reasons to take this   Potassium 99 MG Tabs Take 99 mg by mouth once a week.   rosuvastatin 5 MG tablet Commonly known as: CRESTOR Take 5 mg by mouth once a week. Sunday morning               Discharge Care Instructions  (From admission, onward)           Start     Ordered   04/05/22 0000  Change dressing       Comments: Maintain surgical dressing until follow up in the clinic. If the edges start to pull up, may reinforce with tape. If the dressing is no longer working, may remove and cover with gauze and tape, but must keep the area dry and clean.  Call with any questions or concerns.   04/05/22 0734   04/05/22 0000  Change dressing       Comments: Maintain surgical dressing until follow up in the clinic. If the edges start to pull up, may reinforce with tape. If the dressing is no longer working, may remove and cover with gauze and tape, but must keep the area dry and clean.  Call with any questions or concerns.   04/05/22 0734            Diagnostic Studies: No results found.  Disposition: Discharge disposition: 01-Home or Self Care       Discharge Instructions     Call MD / Call 911   Complete by: As directed    If you experience chest pain or shortness of breath, CALL 911 and be transported to the hospital emergency room.  If you develope a fever above 101 F, pus (white drainage) or increased drainage or redness at the wound, or calf pain, call your surgeon's office.   Change dressing   Complete by: As directed    Maintain surgical dressing until follow up in the clinic. If the edges start to pull up, may reinforce with tape. If the dressing is no longer working, may remove and cover with gauze and tape, but must keep the area dry and clean.  Call with any questions or concerns.   Change dressing   Complete by: As directed    Maintain surgical dressing until follow up in the clinic. If the edges start to pull up, may reinforce with tape. If the  dressing is no longer working, may remove and cover with gauze and tape, but must keep the area dry and clean.  Call with any questions or concerns.   Constipation Prevention   Complete by: As directed    Drink plenty of fluids.  Prune juice may be helpful.  You may use a stool softener, such as Colace (over the counter) 100 mg twice a day.  Use MiraLax (over the counter) for constipation as needed.   Diet - low sodium heart healthy   Complete by: As directed    Increase activity slowly as tolerated   Complete by: As directed    Weight bearing as tolerated with assist device (walker, cane, etc) as directed, use it as long as suggested by your surgeon or therapist, typically at least 4-6 weeks.   Post-operative opioid taper instructions:   Complete by: As directed    POST-OPERATIVE OPIOID TAPER INSTRUCTIONS: It is important to wean off of your opioid medication as soon as possible. If you do not need pain medication after your surgery it is ok to stop day one. Opioids include: Codeine, Hydrocodone(Norco, Vicodin), Oxycodone(Percocet, oxycontin) and hydromorphone amongst others.  Long term and even short term use of opiods can cause: Increased pain response Dependence Constipation Depression Respiratory depression And more.  Withdrawal symptoms can include Flu like symptoms Nausea, vomiting And more Techniques to manage these symptoms Hydrate well Eat regular healthy meals Stay active Use relaxation techniques(deep breathing, meditating, yoga) Do Not substitute Alcohol to help with tapering If you have been on opioids for less than two weeks and do not have pain than it is ok to stop all together.  Plan to wean off of opioids This plan should start within one week post op of your joint replacement. Maintain the same interval or time between taking each dose and first decrease the dose.  Cut the total daily intake of opioids by one tablet each day Next start to increase the time  between doses. The last dose that should be eliminated is the evening dose.      TED hose   Complete by: As directed    Use stockings (TED hose) for 2 weeks on both leg(s).  You may remove them at night for sleeping.        Follow-up Information     Durene Romans, MD. Schedule an appointment as soon as possible for a visit in 2 week(s).   Specialty: Orthopedic Surgery Contact information: [address] [place] [address] [place] [address] [phone]                  Signed: Cassandria Anger 04/14/2022, 8:58 AM

## 2022-04-27 DIAGNOSIS — M25661 Stiffness of right knee, not elsewhere classified: Secondary | ICD-10-CM | POA: Diagnosis not present

## 2022-04-27 DIAGNOSIS — M25561 Pain in right knee: Secondary | ICD-10-CM | POA: Diagnosis not present

## 2022-05-03 DIAGNOSIS — M25561 Pain in right knee: Secondary | ICD-10-CM | POA: Diagnosis not present

## 2022-05-03 DIAGNOSIS — M25661 Stiffness of right knee, not elsewhere classified: Secondary | ICD-10-CM | POA: Diagnosis not present

## 2022-05-07 DIAGNOSIS — M25661 Stiffness of right knee, not elsewhere classified: Secondary | ICD-10-CM | POA: Diagnosis not present

## 2022-05-07 DIAGNOSIS — M25561 Pain in right knee: Secondary | ICD-10-CM | POA: Diagnosis not present

## 2022-05-10 ENCOUNTER — Ambulatory Visit
Admission: RE | Admit: 2022-05-10 | Discharge: 2022-05-10 | Disposition: A | Discharge status: Home / Self Care | Payer: Medicare HMO | Source: Ambulatory Visit | Attending: Family Medicine | Admitting: Family Medicine

## 2022-05-10 DIAGNOSIS — S46891A Other injury of other muscles, fascia and tendons at shoulder and upper arm level, right arm, initial encounter: Secondary | ICD-10-CM | POA: Diagnosis not present

## 2022-05-10 DIAGNOSIS — M25511 Pain in right shoulder: Secondary | ICD-10-CM | POA: Diagnosis not present

## 2022-05-14 DIAGNOSIS — M25561 Pain in right knee: Secondary | ICD-10-CM | POA: Diagnosis not present

## 2022-05-14 DIAGNOSIS — M25661 Stiffness of right knee, not elsewhere classified: Secondary | ICD-10-CM | POA: Diagnosis not present

## 2022-05-17 DIAGNOSIS — Z5189 Encounter for other specified aftercare: Secondary | ICD-10-CM | POA: Diagnosis not present

## 2022-06-01 DIAGNOSIS — D509 Iron deficiency anemia, unspecified: Secondary | ICD-10-CM | POA: Diagnosis not present

## 2022-06-01 DIAGNOSIS — M25561 Pain in right knee: Secondary | ICD-10-CM | POA: Diagnosis not present

## 2022-06-01 DIAGNOSIS — M25661 Stiffness of right knee, not elsewhere classified: Secondary | ICD-10-CM | POA: Diagnosis not present

## 2022-06-09 DIAGNOSIS — R262 Difficulty in walking, not elsewhere classified: Secondary | ICD-10-CM | POA: Diagnosis not present

## 2022-06-09 DIAGNOSIS — M1712 Unilateral primary osteoarthritis, left knee: Secondary | ICD-10-CM | POA: Diagnosis not present

## 2022-06-09 DIAGNOSIS — M25662 Stiffness of left knee, not elsewhere classified: Secondary | ICD-10-CM | POA: Diagnosis not present

## 2022-06-09 DIAGNOSIS — M25562 Pain in left knee: Secondary | ICD-10-CM | POA: Diagnosis not present

## 2022-06-17 DIAGNOSIS — M25662 Stiffness of left knee, not elsewhere classified: Secondary | ICD-10-CM | POA: Diagnosis not present

## 2022-06-17 DIAGNOSIS — M25562 Pain in left knee: Secondary | ICD-10-CM | POA: Diagnosis not present

## 2022-06-17 DIAGNOSIS — R262 Difficulty in walking, not elsewhere classified: Secondary | ICD-10-CM | POA: Diagnosis not present

## 2022-06-17 DIAGNOSIS — M1712 Unilateral primary osteoarthritis, left knee: Secondary | ICD-10-CM | POA: Diagnosis not present

## 2022-06-25 DIAGNOSIS — M1712 Unilateral primary osteoarthritis, left knee: Secondary | ICD-10-CM | POA: Diagnosis not present

## 2022-06-25 DIAGNOSIS — M25662 Stiffness of left knee, not elsewhere classified: Secondary | ICD-10-CM | POA: Diagnosis not present

## 2022-06-25 DIAGNOSIS — M25562 Pain in left knee: Secondary | ICD-10-CM | POA: Diagnosis not present

## 2022-06-25 DIAGNOSIS — R262 Difficulty in walking, not elsewhere classified: Secondary | ICD-10-CM | POA: Diagnosis not present

## 2022-07-02 DIAGNOSIS — R262 Difficulty in walking, not elsewhere classified: Secondary | ICD-10-CM | POA: Diagnosis not present

## 2022-07-02 DIAGNOSIS — M1712 Unilateral primary osteoarthritis, left knee: Secondary | ICD-10-CM | POA: Diagnosis not present

## 2022-07-02 DIAGNOSIS — M25562 Pain in left knee: Secondary | ICD-10-CM | POA: Diagnosis not present

## 2022-07-02 DIAGNOSIS — M25662 Stiffness of left knee, not elsewhere classified: Secondary | ICD-10-CM | POA: Diagnosis not present

## 2022-07-16 DIAGNOSIS — M25562 Pain in left knee: Secondary | ICD-10-CM | POA: Diagnosis not present

## 2022-07-16 DIAGNOSIS — R262 Difficulty in walking, not elsewhere classified: Secondary | ICD-10-CM | POA: Diagnosis not present

## 2022-07-16 DIAGNOSIS — M1712 Unilateral primary osteoarthritis, left knee: Secondary | ICD-10-CM | POA: Diagnosis not present

## 2022-07-16 DIAGNOSIS — M25662 Stiffness of left knee, not elsewhere classified: Secondary | ICD-10-CM | POA: Diagnosis not present

## 2022-07-23 DIAGNOSIS — M25562 Pain in left knee: Secondary | ICD-10-CM | POA: Diagnosis not present

## 2022-07-23 DIAGNOSIS — M1712 Unilateral primary osteoarthritis, left knee: Secondary | ICD-10-CM | POA: Diagnosis not present

## 2022-07-23 DIAGNOSIS — R262 Difficulty in walking, not elsewhere classified: Secondary | ICD-10-CM | POA: Diagnosis not present

## 2022-07-23 DIAGNOSIS — M25662 Stiffness of left knee, not elsewhere classified: Secondary | ICD-10-CM | POA: Diagnosis not present

## 2022-09-01 DIAGNOSIS — Z96651 Presence of right artificial knee joint: Secondary | ICD-10-CM | POA: Diagnosis not present

## 2022-09-01 DIAGNOSIS — M25561 Pain in right knee: Secondary | ICD-10-CM | POA: Diagnosis not present

## 2022-10-13 DIAGNOSIS — E7800 Pure hypercholesterolemia, unspecified: Secondary | ICD-10-CM | POA: Diagnosis not present

## 2022-10-13 DIAGNOSIS — E1136 Type 2 diabetes mellitus with diabetic cataract: Secondary | ICD-10-CM | POA: Diagnosis not present

## 2022-10-13 DIAGNOSIS — D509 Iron deficiency anemia, unspecified: Secondary | ICD-10-CM | POA: Diagnosis not present

## 2022-10-13 DIAGNOSIS — M25561 Pain in right knee: Secondary | ICD-10-CM | POA: Diagnosis not present

## 2022-10-13 DIAGNOSIS — Z993 Dependence on wheelchair: Secondary | ICD-10-CM | POA: Diagnosis not present

## 2022-10-13 DIAGNOSIS — I10 Essential (primary) hypertension: Secondary | ICD-10-CM | POA: Diagnosis not present

## 2022-10-13 DIAGNOSIS — E113292 Type 2 diabetes mellitus with mild nonproliferative diabetic retinopathy without macular edema, left eye: Secondary | ICD-10-CM | POA: Diagnosis not present

## 2022-10-13 DIAGNOSIS — I77819 Aortic ectasia, unspecified site: Secondary | ICD-10-CM | POA: Diagnosis not present

## 2022-10-13 DIAGNOSIS — M25562 Pain in left knee: Secondary | ICD-10-CM | POA: Diagnosis not present

## 2022-10-15 ENCOUNTER — Other Ambulatory Visit: Payer: Self-pay | Admitting: Family Medicine

## 2022-10-15 DIAGNOSIS — I77819 Aortic ectasia, unspecified site: Secondary | ICD-10-CM

## 2022-10-20 DIAGNOSIS — E1169 Type 2 diabetes mellitus with other specified complication: Secondary | ICD-10-CM | POA: Diagnosis not present

## 2022-11-01 ENCOUNTER — Other Ambulatory Visit: Payer: Medicare HMO

## 2022-11-09 ENCOUNTER — Ambulatory Visit
Admission: RE | Admit: 2022-11-09 | Discharge: 2022-11-09 | Disposition: A | Discharge status: Home / Self Care | Payer: Medicare HMO | Source: Ambulatory Visit | Attending: Family Medicine | Admitting: Family Medicine

## 2022-11-09 DIAGNOSIS — I10 Essential (primary) hypertension: Secondary | ICD-10-CM | POA: Diagnosis not present

## 2022-11-09 DIAGNOSIS — I77819 Aortic ectasia, unspecified site: Secondary | ICD-10-CM | POA: Diagnosis not present

## 2022-12-10 ENCOUNTER — Inpatient Hospital Stay (HOSPITAL_COMMUNITY)
Admission: EM | Admit: 2022-12-10 | Discharge: 2022-12-14 | DRG: 682 | Disposition: A | Discharge status: Skilled Nursing Facility | Payer: Medicare HMO | Attending: Family Medicine | Admitting: Family Medicine

## 2022-12-10 ENCOUNTER — Emergency Department (HOSPITAL_COMMUNITY): Payer: Medicare HMO

## 2022-12-10 ENCOUNTER — Inpatient Hospital Stay (HOSPITAL_COMMUNITY): Payer: Medicare HMO

## 2022-12-10 ENCOUNTER — Other Ambulatory Visit: Payer: Self-pay

## 2022-12-10 DIAGNOSIS — E871 Hypo-osmolality and hyponatremia: Secondary | ICD-10-CM | POA: Diagnosis present

## 2022-12-10 DIAGNOSIS — Z79899 Other long term (current) drug therapy: Secondary | ICD-10-CM

## 2022-12-10 DIAGNOSIS — Z8249 Family history of ischemic heart disease and other diseases of the circulatory system: Secondary | ICD-10-CM | POA: Diagnosis not present

## 2022-12-10 DIAGNOSIS — E860 Dehydration: Principal | ICD-10-CM | POA: Diagnosis present

## 2022-12-10 DIAGNOSIS — Z885 Allergy status to narcotic agent status: Secondary | ICD-10-CM | POA: Diagnosis not present

## 2022-12-10 DIAGNOSIS — E872 Acidosis, unspecified: Secondary | ICD-10-CM | POA: Diagnosis not present

## 2022-12-10 DIAGNOSIS — J189 Pneumonia, unspecified organism: Secondary | ICD-10-CM

## 2022-12-10 DIAGNOSIS — R4701 Aphasia: Secondary | ICD-10-CM | POA: Diagnosis present

## 2022-12-10 DIAGNOSIS — R918 Other nonspecific abnormal finding of lung field: Secondary | ICD-10-CM | POA: Diagnosis not present

## 2022-12-10 DIAGNOSIS — N281 Cyst of kidney, acquired: Secondary | ICD-10-CM | POA: Diagnosis present

## 2022-12-10 DIAGNOSIS — Q638 Other specified congenital malformations of kidney: Secondary | ICD-10-CM

## 2022-12-10 DIAGNOSIS — A419 Sepsis, unspecified organism: Secondary | ICD-10-CM | POA: Diagnosis not present

## 2022-12-10 DIAGNOSIS — Z1152 Encounter for screening for COVID-19: Secondary | ICD-10-CM

## 2022-12-10 DIAGNOSIS — Z96651 Presence of right artificial knee joint: Secondary | ICD-10-CM | POA: Diagnosis present

## 2022-12-10 DIAGNOSIS — R197 Diarrhea, unspecified: Secondary | ICD-10-CM | POA: Diagnosis not present

## 2022-12-10 DIAGNOSIS — N179 Acute kidney failure, unspecified: Secondary | ICD-10-CM | POA: Diagnosis not present

## 2022-12-10 DIAGNOSIS — R5383 Other fatigue: Secondary | ICD-10-CM | POA: Diagnosis not present

## 2022-12-10 DIAGNOSIS — E785 Hyperlipidemia, unspecified: Secondary | ICD-10-CM | POA: Diagnosis present

## 2022-12-10 DIAGNOSIS — E1169 Type 2 diabetes mellitus with other specified complication: Secondary | ICD-10-CM | POA: Diagnosis not present

## 2022-12-10 DIAGNOSIS — E861 Hypovolemia: Secondary | ICD-10-CM | POA: Diagnosis present

## 2022-12-10 DIAGNOSIS — E119 Type 2 diabetes mellitus without complications: Secondary | ICD-10-CM

## 2022-12-10 DIAGNOSIS — R42 Dizziness and giddiness: Secondary | ICD-10-CM | POA: Diagnosis not present

## 2022-12-10 DIAGNOSIS — M6281 Muscle weakness (generalized): Secondary | ICD-10-CM | POA: Diagnosis not present

## 2022-12-10 DIAGNOSIS — T50995A Adverse effect of other drugs, medicaments and biological substances, initial encounter: Secondary | ICD-10-CM | POA: Diagnosis present

## 2022-12-10 DIAGNOSIS — I959 Hypotension, unspecified: Secondary | ICD-10-CM | POA: Diagnosis not present

## 2022-12-10 DIAGNOSIS — G928 Other toxic encephalopathy: Secondary | ICD-10-CM | POA: Diagnosis present

## 2022-12-10 DIAGNOSIS — M1990 Unspecified osteoarthritis, unspecified site: Secondary | ICD-10-CM | POA: Diagnosis present

## 2022-12-10 DIAGNOSIS — R2689 Other abnormalities of gait and mobility: Secondary | ICD-10-CM | POA: Diagnosis not present

## 2022-12-10 DIAGNOSIS — Z823 Family history of stroke: Secondary | ICD-10-CM

## 2022-12-10 DIAGNOSIS — N390 Urinary tract infection, site not specified: Secondary | ICD-10-CM | POA: Diagnosis present

## 2022-12-10 DIAGNOSIS — R739 Hyperglycemia, unspecified: Secondary | ICD-10-CM | POA: Diagnosis not present

## 2022-12-10 DIAGNOSIS — N139 Obstructive and reflux uropathy, unspecified: Secondary | ICD-10-CM | POA: Diagnosis present

## 2022-12-10 DIAGNOSIS — Z7401 Bed confinement status: Secondary | ICD-10-CM | POA: Diagnosis not present

## 2022-12-10 DIAGNOSIS — Z8701 Personal history of pneumonia (recurrent): Secondary | ICD-10-CM

## 2022-12-10 DIAGNOSIS — Z888 Allergy status to other drugs, medicaments and biological substances status: Secondary | ICD-10-CM | POA: Diagnosis not present

## 2022-12-10 DIAGNOSIS — E876 Hypokalemia: Secondary | ICD-10-CM | POA: Diagnosis present

## 2022-12-10 DIAGNOSIS — I152 Hypertension secondary to endocrine disorders: Secondary | ICD-10-CM | POA: Diagnosis not present

## 2022-12-10 DIAGNOSIS — D509 Iron deficiency anemia, unspecified: Secondary | ICD-10-CM | POA: Diagnosis not present

## 2022-12-10 DIAGNOSIS — R531 Weakness: Secondary | ICD-10-CM | POA: Diagnosis not present

## 2022-12-10 DIAGNOSIS — R001 Bradycardia, unspecified: Secondary | ICD-10-CM | POA: Diagnosis not present

## 2022-12-10 DIAGNOSIS — Z886 Allergy status to analgesic agent status: Secondary | ICD-10-CM

## 2022-12-10 DIAGNOSIS — E1159 Type 2 diabetes mellitus with other circulatory complications: Secondary | ICD-10-CM | POA: Diagnosis present

## 2022-12-10 LAB — CBC WITH DIFFERENTIAL/PLATELET
Abs Immature Granulocytes: 0.11 10*3/uL — ABNORMAL HIGH (ref 0.00–0.07)
Basophils Absolute: 0 10*3/uL (ref 0.0–0.1)
Basophils Relative: 0 %
Eosinophils Absolute: 0.2 10*3/uL (ref 0.0–0.5)
Eosinophils Relative: 2 %
HCT: 34.4 % — ABNORMAL LOW (ref 36.0–46.0)
Hemoglobin: 11.9 g/dL — ABNORMAL LOW (ref 12.0–15.0)
Immature Granulocytes: 1 %
Lymphocytes Relative: 10 %
Lymphs Abs: 0.8 10*3/uL (ref 0.7–4.0)
MCH: 28.5 pg (ref 26.0–34.0)
MCHC: 34.6 g/dL (ref 30.0–36.0)
MCV: 82.3 fL (ref 80.0–100.0)
Monocytes Absolute: 0.8 10*3/uL (ref 0.1–1.0)
Monocytes Relative: 9 %
Neutro Abs: 6.5 10*3/uL (ref 1.7–7.7)
Neutrophils Relative %: 78 %
Platelets: 197 10*3/uL (ref 150–400)
RBC: 4.18 MIL/uL (ref 3.87–5.11)
RDW: 14.5 % (ref 11.5–15.5)
WBC: 8.4 10*3/uL (ref 4.0–10.5)
nRBC: 0 % (ref 0.0–0.2)

## 2022-12-10 LAB — COMPREHENSIVE METABOLIC PANEL
ALT: 18 U/L (ref 0–44)
AST: 16 U/L (ref 15–41)
Albumin: 2.7 g/dL — ABNORMAL LOW (ref 3.5–5.0)
Alkaline Phosphatase: 92 U/L (ref 38–126)
Anion gap: 17 — ABNORMAL HIGH (ref 5–15)
BUN: 109 mg/dL — ABNORMAL HIGH (ref 8–23)
CO2: 19 mmol/L — ABNORMAL LOW (ref 22–32)
Calcium: 7.2 mg/dL — ABNORMAL LOW (ref 8.9–10.3)
Chloride: 94 mmol/L — ABNORMAL LOW (ref 98–111)
Creatinine, Ser: 5.63 mg/dL — ABNORMAL HIGH (ref 0.44–1.00)
GFR, Estimated: 7 mL/min — ABNORMAL LOW (ref >60)
Glucose, Bld: 191 mg/dL — ABNORMAL HIGH (ref 70–99)
Potassium: 2.3 mmol/L — CL (ref 3.5–5.1)
Sodium: 130 mmol/L — ABNORMAL LOW (ref 135–145)
Total Bilirubin: 0.9 mg/dL (ref <1.2)
Total Protein: 6.4 g/dL — ABNORMAL LOW (ref 6.5–8.1)

## 2022-12-10 LAB — SODIUM, URINE, RANDOM: Sodium, Ur: 38 mmol/L

## 2022-12-10 LAB — URINALYSIS, W/ REFLEX TO CULTURE (INFECTION SUSPECTED)
Bilirubin Urine: NEGATIVE
Glucose, UA: NEGATIVE mg/dL
Ketones, ur: NEGATIVE mg/dL
Nitrite: NEGATIVE
Protein, ur: 30 mg/dL — AB
Specific Gravity, Urine: 1.01 (ref 1.005–1.030)
WBC, UA: >50 WBC/hpf (ref 0–5)
pH: 5 (ref 5.0–8.0)

## 2022-12-10 LAB — RESP PANEL BY RT-PCR (RSV, FLU A&B, COVID)  RVPGX2
Influenza A by PCR: NEGATIVE
Influenza B by PCR: NEGATIVE
Resp Syncytial Virus by PCR: NEGATIVE
SARS Coronavirus 2 by RT PCR: NEGATIVE

## 2022-12-10 LAB — CREATININE, URINE, RANDOM: Creatinine, Urine: 136 mg/dL

## 2022-12-10 LAB — I-STAT CG4 LACTIC ACID, ED: Lactic Acid, Venous: 1.3 mmol/L (ref 0.5–1.9)

## 2022-12-10 LAB — MAGNESIUM: Magnesium: 2.4 mg/dL (ref 1.7–2.4)

## 2022-12-10 MED ORDER — SODIUM CHLORIDE 0.9% FLUSH
3.0000 mL | Freq: Two times a day (BID) | INTRAVENOUS | Status: DC
  Administered 2022-12-10 – 2022-12-14 (×8): 3 mL via INTRAVENOUS

## 2022-12-10 MED ORDER — ONDANSETRON HCL 4 MG PO TABS: 4.0000 mg | ORAL_TABLET | Freq: Four times a day (QID) | ORAL | Status: DC | PRN

## 2022-12-10 MED ORDER — ACETAMINOPHEN 650 MG RE SUPP: 650.0000 mg | Freq: Four times a day (QID) | RECTAL | Status: DC | PRN

## 2022-12-10 MED ORDER — HEPARIN SODIUM (PORCINE) 5000 UNIT/ML IJ SOLN
5000.0000 [IU] | Freq: Three times a day (TID) | INTRAMUSCULAR | Status: DC
  Administered 2022-12-10 – 2022-12-14 (×12): 5000 [IU] via SUBCUTANEOUS
  Filled 2022-12-10 (×12): qty 1

## 2022-12-10 MED ORDER — INSULIN ASPART 100 UNIT/ML IJ SOLN
0.0000 [IU] | Freq: Three times a day (TID) | INTRAMUSCULAR | Status: DC
  Administered 2022-12-11: 2 [IU] via SUBCUTANEOUS
  Administered 2022-12-11: 3 [IU] via SUBCUTANEOUS
  Administered 2022-12-11: 7 [IU] via SUBCUTANEOUS
  Administered 2022-12-12 (×2): 3 [IU] via SUBCUTANEOUS
  Administered 2022-12-12: 5 [IU] via SUBCUTANEOUS
  Administered 2022-12-13 (×2): 3 [IU] via SUBCUTANEOUS
  Administered 2022-12-13: 2 [IU] via SUBCUTANEOUS
  Administered 2022-12-14: 3 [IU] via SUBCUTANEOUS
  Administered 2022-12-14: 2 [IU] via SUBCUTANEOUS
  Filled 2022-12-10: qty 0.09

## 2022-12-10 MED ORDER — POTASSIUM CHLORIDE 20 MEQ PO PACK
40.0000 meq | PACK | Freq: Once | ORAL | Status: AC
  Administered 2022-12-10: 40 meq via ORAL
  Filled 2022-12-10: qty 2

## 2022-12-10 MED ORDER — AZITHROMYCIN 250 MG PO TABS
500.0000 mg | ORAL_TABLET | Freq: Once | ORAL | Status: AC
  Administered 2022-12-10: 500 mg via ORAL
  Filled 2022-12-10: qty 2

## 2022-12-10 MED ORDER — SODIUM CHLORIDE 0.9 % IV SOLN
1.0000 g | Freq: Once | INTRAVENOUS | Status: AC
  Administered 2022-12-10: 1 g via INTRAVENOUS
  Filled 2022-12-10: qty 10

## 2022-12-10 MED ORDER — POTASSIUM CHLORIDE 10 MEQ/100ML IV SOLN
10.0000 meq | Freq: Once | INTRAVENOUS | Status: AC
  Administered 2022-12-10: 10 meq via INTRAVENOUS
  Filled 2022-12-10: qty 100

## 2022-12-10 MED ORDER — LACTATED RINGERS IV SOLN: INTRAVENOUS | Status: DC

## 2022-12-10 MED ORDER — STERILE WATER FOR INJECTION IV SOLN
INTRAVENOUS | Status: AC
  Filled 2022-12-10: qty 150

## 2022-12-10 MED ORDER — SODIUM CHLORIDE 0.9 % IV BOLUS
1000.0000 mL | Freq: Once | INTRAVENOUS | Status: AC
  Administered 2022-12-10: 1000 mL via INTRAVENOUS

## 2022-12-10 MED ORDER — ROSUVASTATIN CALCIUM 5 MG PO TABS
5.0000 mg | ORAL_TABLET | ORAL | Status: DC
  Administered 2022-12-12: 5 mg via ORAL
  Filled 2022-12-10: qty 1

## 2022-12-10 MED ORDER — ONDANSETRON HCL 4 MG/2ML IJ SOLN: 4.0000 mg | Freq: Four times a day (QID) | INTRAMUSCULAR | Status: DC | PRN

## 2022-12-10 MED ORDER — LACTATED RINGERS IV BOLUS (SEPSIS): 1000.0000 mL | Freq: Once | INTRAVENOUS | Status: DC

## 2022-12-10 MED ORDER — ACETAMINOPHEN 325 MG PO TABS: 650.0000 mg | ORAL_TABLET | Freq: Four times a day (QID) | ORAL | Status: DC | PRN

## 2022-12-10 MED ORDER — SENNOSIDES-DOCUSATE SODIUM 8.6-50 MG PO TABS: 1.0000 | ORAL_TABLET | Freq: Every evening | ORAL | Status: DC | PRN

## 2022-12-10 NOTE — Hospital Course (Signed)
Susan Turner is a 77 y.o. female with medical history significant for T2DM, HTN, HLD, iron deficiency anemia, osteoarthritis who is admitted with acute kidney injury.

## 2022-12-10 NOTE — ED Provider Notes (Signed)
[place] EMERGENCY DEPARTMENT AT [facility] Provider Note   CSN: [id number] Arrival date & time: 12/10/22  1535     History  Chief Complaint  Patient presents with   Hypotension   Fatigue   Aphasia    Susan Turner is a 77 y.o. female.  HPI   Pt states she has been feeling lightheaded the past week.  She denies any fever.  She has been having trouble with diarrhea since thanksgiving but has decreased in the last couple of days.  No vomiting but decreased appetite and decreased po intake.  Pt was diagnosed with a uti and started abx yesterday.no chest pain, no abd pain.  She feels weak all over.  Daughter thought she didn't look well today.  EMS was called  and pt was noted to be hypotensive. Pt uses a wheelchair, occasionally walker use at baseline.  Home Medications Prior to Admission medications   Medication Sig Start Date End Date Taking? Authorizing Provider  amLODipine (NORVASC) 5 MG tablet Take 5 mg by mouth 2 (two) times daily.     [provider]  calcium carbonate (TUMS - DOSED IN MG ELEMENTAL CALCIUM) 500 MG chewable tablet Chew 2 tablets by mouth daily as needed for indigestion or heartburn.    [provider]  ferrous sulfate dried (SLOW FE) 160 (50 FE) MG TBCR Take 160 mg by mouth once a week.     [provider]  gabapentin (NEURONTIN) 300 MG capsule Take 300 mg by mouth 3 (three) times daily.    [provider]  HYDROcodone-acetaminophen (NORCO/VICODIN) 5-325 MG tablet Take 1 tablet by mouth every 4 (four) hours as needed for severe pain. 04/05/22   Cassandria Anger, PA-C  irbesartan (AVAPRO) 300 MG tablet Take 300 mg by mouth daily. 08/26/18   [provider]  methocarbamol (ROBAXIN) 500 MG tablet Take 1 tablet (500 mg total) by mouth every 6 (six) hours as needed for muscle spasms. 04/05/22   Cassandria Anger, PA-C  metoprolol tartrate (LOPRESSOR) 100 MG tablet Take 100 mg by mouth 2 (two) times daily.      [provider]  polyethylene glycol (MIRALAX / GLYCOLAX) 17 g packet Take 17 g by mouth 2 (two) times daily. 04/05/22   Cassandria Anger, PA-C  Potassium 99 MG TABS Take 99 mg by mouth once a week.    [provider]  rosuvastatin (CRESTOR) 5 MG tablet Take 5 mg by mouth once a week. Sunday morning    [provider]      Allergies    Lisinopril, Codeine, and Motrin [ibuprofen]    Review of Systems   Review of Systems  Physical Exam Updated Vital Signs BP (!) 100/51   Pulse (!) 53   Resp 14   Ht 1.651 m (5\' 5" )   Wt 90.7 kg   SpO2 94%   BMI 33.28 kg/m  Physical Exam Vitals and nursing note reviewed.  Constitutional:      General: She is not in acute distress.    Appearance: She is well-developed.  HENT:     Head: Normocephalic and atraumatic.     Right Ear: External ear normal.     Left Ear: External ear normal.     Mouth/Throat:     Mouth: Mucous membranes are dry.     Tongue: No lesions.     Pharynx: No pharyngeal swelling or posterior oropharyngeal erythema.  Eyes:     General: No scleral icterus.  Right eye: No discharge.        Left eye: No discharge.     Conjunctiva/sclera: Conjunctivae normal.  Neck:     Trachea: No tracheal deviation.  Cardiovascular:     Rate and Rhythm: Normal rate and regular rhythm.  Pulmonary:     Effort: Pulmonary effort is normal. No respiratory distress.     Breath sounds: Normal breath sounds. No stridor. No wheezing or rales.  Abdominal:     General: Bowel sounds are normal. There is no distension.     Palpations: Abdomen is soft.     Tenderness: There is no abdominal tenderness. There is no guarding or rebound.  Musculoskeletal:        General: No tenderness or deformity.     Cervical back: Neck supple.  Skin:    General: Skin is warm and dry.     Findings: No rash.  Neurological:     Mental Status: She is alert.     Cranial Nerves: No cranial nerve deficit, dysarthria or facial asymmetry.      Sensory: No sensory deficit.     Motor: Weakness present. No abnormal muscle tone or seizure activity.     Coordination: Coordination normal.     Comments: Generalized weakness although patient is able to lift both arms and legs off the bed, no focal deficits noted  Psychiatric:        Mood and Affect: Mood normal.     ED Results / Procedures / Treatments   Labs (all labs ordered are listed, but only abnormal results are displayed) Labs Reviewed  COMPREHENSIVE METABOLIC PANEL - Abnormal; Notable for the following components:      Result Value   Sodium 130 (*)    Potassium 2.3 (*)    Chloride 94 (*)    CO2 19 (*)    Glucose, Bld 191 (*)    BUN 109 (*)    Creatinine, Ser 5.63 (*)    Calcium 7.2 (*)    Total Protein 6.4 (*)    Albumin 2.7 (*)    GFR, Estimated 7 (*)    Anion gap 17 (*)    All other components within normal limits  CBC WITH DIFFERENTIAL/PLATELET - Abnormal; Notable for the following components:   Hemoglobin 11.9 (*)    HCT 34.4 (*)    Abs Immature Granulocytes 0.11 (*)    All other components within normal limits  CULTURE, BLOOD (ROUTINE X 2)  CULTURE, BLOOD (ROUTINE X 2)  URINALYSIS, W/ REFLEX TO CULTURE (INFECTION SUSPECTED)  I-STAT CG4 LACTIC ACID, ED    EKG EKG Interpretation Date/Time:  Friday December 10 2022 16:52:11 EST Ventricular Rate:  52 PR Interval:  222 QRS Duration:  190 QT Interval:  519 QTC Calculation: 483 R Axis:   -54  Text Interpretation: Sinus rhythm Prolonged PR interval Right bundle branch block LVH with IVCD and secondary repol abnrm No significant change since last tracing Confirmed by Linwood Dibbles [phone]) on 12/10/2022 5:02:55 PM  Radiology DG Chest Port 1 View  Result Date: 12/10/2022 CLINICAL DATA:  Sepsis, lethargy EXAM: PORTABLE CHEST 1 VIEW COMPARISON:  08/31/2018 FINDINGS: Faint patchy left midlung opacity, suggesting mild left upper lobe infection/pneumonia. Right lung is clear. No pleural effusion or pneumothorax.  The heart is normal in size. IMPRESSION: Faint patchy left midlung opacity, suggesting mild left upper lobe infection/pneumonia. Electronically Signed   By: Charline Bills M.D.   On: 12/10/2022 17:57    Procedures .Critical Care  Performed by:  Linwood Dibbles, MD Authorized by: Linwood Dibbles, MD   Critical care provider statement:    Critical care time (minutes):  30   Critical care was time spent personally by me on the following activities:  Development of treatment plan with patient or surrogate, discussions with consultants, evaluation of patient's response to treatment, examination of patient, ordering and review of laboratory studies, ordering and review of radiographic studies, ordering and performing treatments and interventions, pulse oximetry, re-evaluation of patient's condition and review of old charts     Medications Ordered in ED Medications  potassium chloride 10 mEq in 100 mL IVPB (10 mEq Intravenous New Bag/Given 12/10/22 1936)  cefTRIAXone (ROCEPHIN) 1 g in sodium chloride 0.9 % 100 mL IVPB (1 g Intravenous New Bag/Given 12/10/22 1937)  sodium chloride 0.9 % bolus 1,000 mL (1,000 mLs Intravenous New Bag/Given 12/10/22 1721)  sodium chloride 0.9 % bolus 1,000 mL (1,000 mLs Intravenous New Bag/Given 12/10/22 1937)  azithromycin (ZITHROMAX) tablet 500 mg (500 mg Oral Given 12/10/22 1937)    ED Course/ Medical Decision Making/ A&P Clinical Course as of 12/10/22 1941  Fri Dec 10, 2022  1913 Patient's labs are consistent with acute renal failure with elevated BUN and creatinine.  Potassium also decreased at 2.3 [JK]  1915 X-ray suggest the possibility of pneumonia [JK]  1941 Case discussed with Dr Allena Katz [JK]    Clinical Course User Index [JK] Linwood Dibbles, MD                                 Medical Decision Making Problems Addressed: AKI (acute kidney injury) [facility]): acute illness or injury that poses a threat to life or bodily functions Community acquired pneumonia, unspecified  laterality: acute illness or injury that poses a threat to life or bodily functions Dehydration: acute illness or injury that poses a threat to life or bodily functions  Amount and/or Complexity of Data Reviewed Labs: ordered. Radiology: ordered. ECG/medicine tests: ordered.  Risk Prescription drug management. Decision regarding hospitalization.   Patient present to the ED for evaluation of diarrhea and weakness.  Patient appeared to be dehydrated on exam.  Patient reportedly was hypotensive with EMS but it has improved here in the ED.  Blood pressures remain stable.  No evidence of lactic acidosis to suggest evolving sepsis.  She does not have a leukocytosis.  Patient's laboratory test however do show elevated BUN and creatinine and hypokalemia and hyponatremia.  Patient has been treated with IV fluids.  IV potassium has also been ordered but will hold off on multiple doses considering her compromised renal function.  Bladder scan does not show signs of urinary retention.  We will get a urine sample.  Chest x-ray suggest the possibility of pneumonia.  Will start patient empirically on antibiotics.  I will consult the medical service for admission further treatment        Final Clinical Impression(s) / ED Diagnoses Final diagnoses:  Dehydration  AKI (acute kidney injury) (HCC)  Community acquired pneumonia, unspecified laterality    Rx / DC Orders ED Discharge Orders     None         Linwood Dibbles, MD 12/10/22 1941

## 2022-12-10 NOTE — H&P (Signed)
History and Physical    Susan Turner:[id number] DOB: 16-Oct-1945 DOA: 12/10/2022  PCP: Wilfrid Lund, PA  Patient coming from: Home  I have personally briefly reviewed patient's old medical records in [facility]  Chief Complaint: Fatigue, diarrhea, lightheadedness  HPI: Susan Turner is a 77 y.o. female with medical history significant for T2DM, HTN, HLD, iron deficiency anemia, osteoarthritis who presented to the ED for evaluation of diarrhea, fatigue, lightheadedness.  Patient states that she has been having diarrhea since Thanksgiving.  This was consisted of watery stools occurring 2-3 times a day but has been improving over the last 2 days.  She denies nausea and vomiting but reports decreased appetite with decreased oral intake.  She says she was diagnosed with a UTI recently and started an antibiotic yesterday but she is not sure which antibiotic.  Patient states she has had decreased urine output than expected.  She denies dysuria.  She has been feeling generally fatigued.  She felt lightheaded when walking and thought she was going to pass out but has not lost consciousness.  Patient usually uses walker or wheelchair at baseline.  Her daughter thought that patient did not look well today therefore EMS were called to home.  She was noted to be hypotensive with BP 80/54.  She was given 300 mL normal saline with improvement to 102/70.  ED Course  Labs/Imaging on admission: I have personally reviewed following labs and imaging studies.  Initial vitals showed BP 101/54, pulse 53, RR 16, temperature not recorded, SpO2 96% on room air.  Labs show sodium 130, potassium 2.3, bicarb 19, BUN 109, creatinine 5.63, serum glucose 191, LFTs within normal limits, WBC 8.4, hemoglobin 11.9, platelets 197,000, lactic acid 1.3.  Blood culture ordered and pending.  UA pending collection.  Formal chest x-ray showed faint patchy left midlung opacity.  Patient was given 2 L normal  saline, IV K 10 mEq x 1, ceftriaxone and azithromycin.  The hospitalist service was consulted to admit for further evaluation and management.  Review of Systems: All systems reviewed and are negative except as documented in history of present illness above.   Past Medical History:  Diagnosis Date   Anemia    Arthritis    Carpal tunnel syndrome on both sides    Gout    Hyperlipidemia    Hypertension    Iron deficiency anemia    Lipoma of back    Pneumonia    Pre-diabetes    URI (upper respiratory infection)    Weight loss     Past Surgical History:  Procedure Laterality Date   DILATION AND CURETTAGE OF UTERUS     HAND SURGERY Right    Lipoma removed   JOINT REPLACEMENT     REIMPLANTATION OF TOTAL KNEE Right 04/02/2022   Procedure: REIMPLANTATION OF TOTAL KNEE AND REMOVAL OF ANTIBIOTIC SPACER;  Surgeon: Durene Romans, MD;  Location: [facility];  Service: Orthopedics;  Laterality: Right;   TOTAL KNEE ARTHROPLASTY Right    TOTAL KNEE REVISION Right 12/10/2021   Procedure: TOTAL KNEE REVISION;  Surgeon: Durene Romans, MD;  Location: [facility];  Service: Orthopedics;  Laterality: Right;    Social History:  reports that she has never smoked. She has never used smokeless tobacco. She reports that she does not drink alcohol and does not use drugs.  Allergies  Allergen Reactions   Lisinopril Cough   Codeine Other (See Comments)    Tachycardia    Motrin [Ibuprofen]  Pt does not remember reaction     Family History  Problem Relation Age of Onset   Hypertension Mother    Stroke Mother    Cancer Father      Prior to Admission medications   Medication Sig Start Date End Date Taking? Authorizing Provider  amLODipine (NORVASC) 5 MG tablet Take 5 mg by mouth 2 (two) times daily.     [provider]  calcium carbonate (TUMS - DOSED IN MG ELEMENTAL CALCIUM) 500 MG chewable tablet Chew 2 tablets by mouth daily as needed for indigestion or heartburn.    [provider]   ferrous sulfate dried (SLOW FE) 160 (50 FE) MG TBCR Take 160 mg by mouth once a week.     [provider]  gabapentin (NEURONTIN) 300 MG capsule Take 300 mg by mouth 3 (three) times daily.    [provider]  HYDROcodone-acetaminophen (NORCO/VICODIN) 5-325 MG tablet Take 1 tablet by mouth every 4 (four) hours as needed for severe pain. 04/05/22   Cassandria Anger, PA-C  irbesartan (AVAPRO) 300 MG tablet Take 300 mg by mouth daily. 08/26/18   [provider]  methocarbamol (ROBAXIN) 500 MG tablet Take 1 tablet (500 mg total) by mouth every 6 (six) hours as needed for muscle spasms. 04/05/22   Cassandria Anger, PA-C  metoprolol tartrate (LOPRESSOR) 100 MG tablet Take 100 mg by mouth 2 (two) times daily.     [provider]  polyethylene glycol (MIRALAX / GLYCOLAX) 17 g packet Take 17 g by mouth 2 (two) times daily. 04/05/22   Cassandria Anger, PA-C  Potassium 99 MG TABS Take 99 mg by mouth once a week.    [provider]  rosuvastatin (CRESTOR) 5 MG tablet Take 5 mg by mouth once a week. Sunday morning    [provider]    Physical Exam: Vitals:   12/10/22 1723 12/10/22 1900 12/10/22 1953 12/10/22 2300  BP:  (!) 100/51  102/63  Pulse:  (!) 53 60 (!) 54  Resp:  14 16 14   Temp:   (!) 97.3 F (36.3 C)   TempSrc:   Oral   SpO2:  94% 97% 96%  Weight: 90.7 kg     Height: 5\' 5"  (1.651 m)      Constitutional: Resting in bed, NAD, calm, comfortable Eyes: EOMI, lids and conjunctivae normal ENMT: Mucous membranes are dry. Posterior pharynx clear of any exudate or lesions.Normal dentition.  Neck: normal, supple, no masses. Respiratory: clear to auscultation bilaterally, no wheezing, no crackles. Normal respiratory effort. No accessory muscle use.  Cardiovascular: Regular rate and rhythm, no murmurs / rubs / gallops. No extremity edema. 2+ pedal pulses. Abdomen: no tenderness, no masses palpated.  Musculoskeletal: no clubbing / cyanosis. No joint  deformity upper and lower extremities. Good ROM, no contractures. Normal muscle tone.  Skin: no rashes, lesions, ulcers. No induration Neurologic: Sensation intact. Strength 5/5 in all 4.  Psychiatric: Normal judgment and insight. Alert and oriented x 3. Normal mood.   EKG: Personally reviewed. Sinus rhythm, first-degree AV block, RBBB, no acute ischemic changes.  PR interval more prolonged when compared to previous.  Assessment/Plan Principal Problem:   Acute kidney injury (HCC) Active Problems:   Hypokalemia   Hyponatremia   Type 2 diabetes mellitus (HCC)   Hypertension associated with diabetes (HCC)   Hyperlipidemia associated with type 2 diabetes mellitus (HCC)   Anahla Sprandel Himes is a 77 y.o. female with medical history significant for T2DM, HTN,  HLD, iron deficiency anemia, osteoarthritis who is admitted with acute kidney injury.  Assessment and Plan: Acute kidney injury: BUN 109 and creatinine 5.63 on admission.  Previous creatinine was 0.80 on 04/03/2022.  Suspect prerenal secondary to volume depletion from GI losses and poor oral intake. -Continue IV sodium bicarb infusion overnight -Follow urine studies -Obtain renal ultrasound -Monitor UOP and repeat labs in a.m.  Hypokalemia: Supplementing.  Left upper lobe opacity: Nonspecific faint patchy left midlung opacity seen on portable chest x-ray.  Patient does not have any respiratory symptoms or signs of infection/pneumonia.  She was given ceftriaxone and azithromycin in the ED, will hold further antibiotics for now.  Hyponatremia: Secondary to volume depletion.  Repeat labs in AM.  Hypertension: Holding antihypertensives due to initial hypotension.  Type 2 diabetes: Not currently on medical management.  Last A1c 6.6% on 03/22/2022.  Placed on SSI.  Hyperlipidemia: Continue rosuvastatin.  Osteoarthritis: Patient denies NSAID use.  Continue Tylenol as needed.   DVT prophylaxis: heparin injection 5,000 Units Start:  12/10/22 2200 Code Status: Full code, confirmed with patient on admission Family Communication: Discussed with patient, she has discussed with family Disposition Plan: From home, dispo pending clinical progress Consults called: None Severity of Illness: The appropriate patient status for this patient is INPATIENT. Inpatient status is judged to be reasonable and necessary in order to provide the required intensity of service to ensure the patient's safety. The patient's presenting symptoms, physical exam findings, and initial radiographic and laboratory data in the context of their chronic comorbidities is felt to place them at high risk for further clinical deterioration. Furthermore, it is not anticipated that the patient will be medically stable for discharge from the hospital within 2 midnights of admission.   * I certify that at the point of admission it is my clinical judgment that the patient will require inpatient hospital care spanning beyond 2 midnights from the point of admission due to high intensity of service, high risk for further deterioration and high frequency of surveillance required.Darreld Mclean MD [facility]  If 7PM-7AM, please contact night-coverage [email or web address]  12/10/2022, 11:51 PM

## 2022-12-10 NOTE — ED Triage Notes (Signed)
Pt BIBA from home. C/o lethargy- was dx with UTI 2x weeks ago- just started abx yesterday.  Hypotensive on EMS arrival at 80/54, given 300 mL NS. Up to 102/70  AOx4

## 2022-12-11 ENCOUNTER — Encounter (HOSPITAL_COMMUNITY): Payer: Self-pay | Admitting: Internal Medicine

## 2022-12-11 DIAGNOSIS — N179 Acute kidney failure, unspecified: Secondary | ICD-10-CM | POA: Diagnosis not present

## 2022-12-11 LAB — CBC
HCT: 32.2 % — ABNORMAL LOW (ref 36.0–46.0)
Hemoglobin: 11.2 g/dL — ABNORMAL LOW (ref 12.0–15.0)
MCH: 28.7 pg (ref 26.0–34.0)
MCHC: 34.8 g/dL (ref 30.0–36.0)
MCV: 82.6 fL (ref 80.0–100.0)
Platelets: 200 10*3/uL (ref 150–400)
RBC: 3.9 MIL/uL (ref 3.87–5.11)
RDW: 14.6 % (ref 11.5–15.5)
WBC: 6.3 10*3/uL (ref 4.0–10.5)
nRBC: 0 % (ref 0.0–0.2)

## 2022-12-11 LAB — CBG MONITORING, ED: Glucose-Capillary: 154 mg/dL — ABNORMAL HIGH (ref 70–99)

## 2022-12-11 LAB — BASIC METABOLIC PANEL
Anion gap: 17 — ABNORMAL HIGH (ref 5–15)
BUN: 97 mg/dL — ABNORMAL HIGH (ref 8–23)
CO2: 17 mmol/L — ABNORMAL LOW (ref 22–32)
Calcium: 6.9 mg/dL — ABNORMAL LOW (ref 8.9–10.3)
Chloride: 102 mmol/L (ref 98–111)
Creatinine, Ser: 3.33 mg/dL — ABNORMAL HIGH (ref 0.44–1.00)
GFR, Estimated: 14 mL/min — ABNORMAL LOW (ref >60)
Glucose, Bld: 163 mg/dL — ABNORMAL HIGH (ref 70–99)
Potassium: 2.4 mmol/L — CL (ref 3.5–5.1)
Sodium: 136 mmol/L (ref 135–145)

## 2022-12-11 LAB — GLUCOSE, CAPILLARY
Glucose-Capillary: 306 mg/dL — ABNORMAL HIGH (ref 70–99)
Glucose-Capillary: 237 mg/dL — ABNORMAL HIGH (ref 70–99)
Glucose-Capillary: 232 mg/dL — ABNORMAL HIGH (ref 70–99)

## 2022-12-11 MED ORDER — STERILE WATER FOR INJECTION IV SOLN
INTRAVENOUS | Status: AC
  Filled 2022-12-11: qty 150
  Filled 2022-12-11: qty 1000

## 2022-12-11 MED ORDER — SODIUM CHLORIDE 0.9 % IV BOLUS (SEPSIS)
1000.0000 mL | Freq: Once | INTRAVENOUS | Status: AC
  Administered 2022-12-11: 1000 mL via INTRAVENOUS

## 2022-12-11 MED ORDER — POTASSIUM CHLORIDE 20 MEQ PO PACK
40.0000 meq | PACK | Freq: Two times a day (BID) | ORAL | Status: DC
  Administered 2022-12-11 – 2022-12-14 (×6): 40 meq via ORAL
  Filled 2022-12-11 (×7): qty 2

## 2022-12-11 MED ORDER — POTASSIUM CHLORIDE CRYS ER 20 MEQ PO TBCR
40.0000 meq | EXTENDED_RELEASE_TABLET | Freq: Two times a day (BID) | ORAL | Status: DC
  Administered 2022-12-11: 40 meq via ORAL
  Filled 2022-12-11: qty 2

## 2022-12-11 NOTE — ED Notes (Signed)
Assumed care of patient. Patient resting comfortably in bed with no signs of acute distress noted. Second set of blood cultures collected, AM labs also collected. Patient asking about breakfast and informed her that she would get a breakfast tray as soon as they arrived from the kitchen. Verbalized understanding.

## 2022-12-11 NOTE — Progress Notes (Signed)
HOSPITALIST ROUNDING NOTE Susan Turner ZOX:[id number]  DOB: 09/27/1945  DOA: 12/10/2022  PCP: Wilfrid Lund, PA  12/11/2022,7:20 AM   LOS: 1 day      Code Status: full  From:  home    Current Dispo: unclear     76 uses wheelchair baseline occasional walker wf R tkr in the past with revision 04/05/2022,--- this was followed previously by Dr. 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  Obstructive uropathy-FENa 1.2%-PTA ARB, chlorthalidone 12.5, a VaPro 300 All above meds on hold-dose adjust other meds Kidney function already improving Hypokalemia-replace with p.o. K-Dur to 40 twice daily received IV replacement already-check a.m. mag Hyponatremia-completely resolved with IV fluid Acidosis-watch if worse in the morning would add bicarb  orally High anion gap acidosis-secondary to diarrheal losses and should improve with fixing underlying problem as she has no further diarrhea Renal US pending  Diarrheal illness Completely resolved at time of my eval last stool was last night No further workup  PTA UTI Nitrites negative leukocytes large hemoglobin moderate As afebrile at this time, hold antibiotics  No pneumonia X-ray nonconcerning for pneumonia hold antibiotics  DM TY 2 HLD Sliding scale coverage at this, does not appear to be on any meds for this   HTN Resume metoprolol but lower the dose to 75 bid and can return to 100 dosing at discharge  Prior PJI infection status post revision status post previous treatment with IV antibiotics Stable  No family present at this time  DVT prophylaxis: Improved  Status is: Inpatient Remains inpatient appropriate because:   Needs improvement of kidney function Ambulate in hallway when able      Subjective: Awake coherent had breakfast no distress no dizziness although felt dizzy yesterday day before Does not seem confused No chest pain No cough No burning in the urine  Objective + exam Vitals:   12/11/22 0205 12/11/22 0500 12/11/22 0510 12/11/22 0518  BP: (!) 115/54 (!) 87/45 (!) 90/48   Pulse: (!) 55 64 60   Resp: 16 18 17    Temp:    (!) 97.5 F (36.4 C)  TempSrc:    Oral  SpO2: 95% 96% 99%   Weight:      Height:       Filed Weights   12/10/22 1555 12/10/22 1723  Weight: 90.7 kg 90.7 kg    Examination:  EOMI NCAT no focal deficit no icterus no pallor Thick neck S1-S2 no murmur Large lipoma on the upper  back Chest clear no wheeze rales rhonchi Abdomen soft no rebound ROM intact Power 5/5  Data Reviewed: reviewed   CBC    Component Value Date/Time   WBC 8.4 12/10/2022 1710   RBC 4.18 12/10/2022 1710   HGB 11.9 (L) 12/10/2022 1710   HCT 34.4 (L) 12/10/2022 1710   PLT 197 12/10/2022 1710   MCV 82.3 12/10/2022 1710   MCV 83.4  05/31/2015 1517   MCH 28.5 12/10/2022 1710   MCHC 34.6 12/10/2022 1710   RDW 14.5 12/10/2022 1710   LYMPHSABS 0.8 12/10/2022 1710   MONOABS 0.8 12/10/2022 1710   EOSABS 0.2 12/10/2022 1710   BASOSABS 0.0 12/10/2022 1710      Latest Ref Rng & Units 12/10/2022    5:10 PM 04/03/2022    3:19 AM 03/22/2022    9:29 AM  CMP  Glucose 70 - 99 mg/dL 161  096  045   BUN 8 - 23 mg/dL 409  24  16   Creatinine 0.44 - 1.00 mg/dL 8.11  9.14  7.82   Sodium 135 - 145 mmol/L 130  137  140   Potassium 3.5 - 5.1 mmol/L 2.3  3.7  3.6   Chloride 98 - 111 mmol/L 94  107  106   CO2 22 - 32 mmol/L 19  21  27    Calcium 8.9 - 10.3 mg/dL 7.2  8.4  9.3   Total Protein 6.5 - 8.1 g/dL 6.4     Total Bilirubin <1.2 mg/dL 0.9     Alkaline Phos 38 - 126 U/L 92     AST 15 - 41 U/L 16     ALT 0 - 44 U/L 18        Scheduled Meds:  heparin  5,000 Units Subcutaneous Q8H   insulin aspart  0-9 Units Subcutaneous TID WC   [START ON 12/12/2022] rosuvastatin  5 mg Oral Weekly   sodium chloride flush  3 mL Intravenous Q12H   Continuous Infusions:  sodium bicarbonate 150 mEq in sterile water 1,150 mL infusion 100 mL/hr at 12/10/22 2201    Time  57  Rhetta Mura, MD  [facility]

## 2022-12-11 NOTE — ED Notes (Signed)
ED TO INPATIENT HANDOFF REPORT  Name/Age/Gender Susan Turner 77 y.o. female  Code Status    Code Status Orders  (From admission, onward)           Start     Ordered   12/10/22 2057  Full code  Continuous       Question:  By:  Answer:  Consent: discussion documented in EHR   12/10/22 2058           Code Status History     Date Active Date Inactive Code Status Order ID Comments User Context   04/02/2022 1648 04/05/2022 2013 Full Code [id number]  Cordella Register Inpatient   12/10/2021 1315 12/14/2021 1927 Full Code [id number]  Cassandria Anger, PA-C Inpatient       Home/SNF/Other Nursing Home  Chief Complaint Acute kidney injury [facility]) [N17.9]  Level of Care/Admitting Diagnosis ED Disposition     ED Disposition  Admit   Condition  --   Comment  Hospital Area: [facility] [[id number]]  Level of Care: Progressive [102]  Admit to Progressive based on following criteria: MULTISYSTEM THREATS such as stable sepsis, metabolic/electrolyte imbalance with or without encephalopathy that is responding to early treatment.  May admit patient to [facility] or [facility] if equivalent level of care is available:: No  Covid Evaluation: Symptomatic Person Under Investigation (PUI) or recent exposure (last 10 days) *Testing Required*  Diagnosis: Acute kidney injury [facility]) [[id number]]  Admitting Physician: Charlsie Quest [[id number]]  Attending Physician: Charlsie Quest [[id number]]  Certification:: I certify this patient will need inpatient services for at least 2 midnights  Expected Medical Readiness: 12/13/2022          Medical History Past Medical History:  Diagnosis Date   Anemia    Arthritis    Carpal tunnel syndrome on both sides    Gout    Hyperlipidemia    Hypertension    Iron deficiency anemia    Lipoma of back    Pneumonia    Pre-diabetes    URI (upper respiratory infection)    Weight loss     Allergies Allergies  Allergen  Reactions   Lisinopril Cough   Codeine Other (See Comments)    Tachycardia    Motrin [Ibuprofen]     Pt does not remember reaction     IV Location/Drains/Wounds Patient Lines/Drains/Airways Status     Active Line/Drains/Airways     Name Placement date Placement time Site Days   Peripheral IV 12/10/22 20 G Left Antecubital 12/10/22  --  Antecubital  1   Peripheral IV 12/10/22 22 G 1.75" Anterior;Right Forearm 12/10/22  1814  Forearm  1            Labs/Imaging Results for orders placed or performed during the hospital encounter of 12/10/22 (from the past 48 hour(s))  Urinalysis, w/ Reflex to Culture (Infection Suspected) -Urine, Clean Catch     Status: Abnormal   Collection Time: 12/10/22  4:26 PM  Result Value Ref Range   Specimen Source URINE, CLEAN CATCH    Color, Urine YELLOW YELLOW   APPearance CLOUDY (A) CLEAR   Specific Gravity, Urine 1.010 1.005 - 1.030   pH 5.0 5.0 - 8.0   Glucose, UA NEGATIVE NEGATIVE mg/dL   Hgb urine dipstick MODERATE (A) NEGATIVE   Bilirubin Urine NEGATIVE NEGATIVE   Ketones, ur NEGATIVE NEGATIVE mg/dL   Protein, ur 30 (A) NEGATIVE mg/dL   Nitrite NEGATIVE NEGATIVE   Leukocytes,Ua LARGE (A)  NEGATIVE   RBC / HPF 21-50 0 - 5 RBC/hpf   WBC, UA >50 0 - 5 WBC/hpf    Comment:        Reflex urine culture not performed if WBC <=10, OR if Squamous epithelial cells >5. If Squamous epithelial cells >5 suggest recollection.    Bacteria, UA RARE (A) NONE SEEN   Squamous Epithelial / HPF 11-20 0 - 5 /HPF   WBC Clumps PRESENT    Mucus PRESENT     Comment: Performed at [facility], [address], [place], [place] [address]  Comprehensive metabolic panel     Status: Abnormal   Collection Time: 12/10/22  5:10 PM  Result Value Ref Range   Sodium 130 (L) 135 - 145 mmol/L   Potassium 2.3 (LL) 3.5 - 5.1 mmol/L    Comment: CRITICAL RESULT CALLED TO, READ BACK BY AND VERIFIED WITH HAMPTON, T RN @ [phone] 12/10/22. GILBERTL    Chloride  94 (L) 98 - 111 mmol/L   CO2 19 (L) 22 - 32 mmol/L   Glucose, Bld 191 (H) 70 - 99 mg/dL    Comment: Glucose reference range applies only to samples taken after fasting for at least 8 hours.   BUN 109 (H) 8 - 23 mg/dL    Comment: RESULT CONFIRMED BY MANUAL DILUTION   Creatinine, Ser 5.63 (H) 0.44 - 1.00 mg/dL   Calcium 7.2 (L) 8.9 - 10.3 mg/dL   Total Protein 6.4 (L) 6.5 - 8.1 g/dL   Albumin 2.7 (L) 3.5 - 5.0 g/dL   AST 16 15 - 41 U/L   ALT 18 0 - 44 U/L   Alkaline Phosphatase 92 38 - 126 U/L   Total Bilirubin 0.9 <1.2 mg/dL   GFR, Estimated 7 (L) >60 mL/min    Comment: (NOTE) Calculated using the CKD-EPI Creatinine Equation (2021)    Anion gap 17 (H) 5 - 15    Comment: Performed at [facility], [address], [place], [place] [address]  CBC with Differential     Status: Abnormal   Collection Time: 12/10/22  5:10 PM  Result Value Ref Range   WBC 8.4 4.0 - 10.5 K/uL   RBC 4.18 3.87 - 5.11 MIL/uL   Hemoglobin 11.9 (L) 12.0 - 15.0 g/dL   HCT 35.3 (L) 61.4 - 43.1 %   MCV 82.3 80.0 - 100.0 fL   MCH 28.5 26.0 - 34.0 pg   MCHC 34.6 30.0 - 36.0 g/dL   RDW 54.0 08.6 - 76.1 %   Platelets 197 150 - 400 K/uL   nRBC 0.0 0.0 - 0.2 %   Neutrophils Relative % 78 %   Neutro Abs 6.5 1.7 - 7.7 K/uL   Lymphocytes Relative 10 %   Lymphs Abs 0.8 0.7 - 4.0 K/uL   Monocytes Relative 9 %   Monocytes Absolute 0.8 0.1 - 1.0 K/uL   Eosinophils Relative 2 %   Eosinophils Absolute 0.2 0.0 - 0.5 K/uL   Basophils Relative 0 %   Basophils Absolute 0.0 0.0 - 0.1 K/uL   Immature Granulocytes 1 %   Abs Immature Granulocytes 0.11 (H) 0.00 - 0.07 K/uL    Comment: Performed at [facility], [address] [place], [place] [address]  Blood Culture (routine x 2)     Status: None (Preliminary result)   Collection Time: 12/10/22  5:10 PM   Specimen: BLOOD  Result Value Ref Range   Specimen Description  BLOOD LEFT ANTECUBITAL Performed at [facility], [address] [place], [place] [address]    Special Requests      BOTTLES DRAWN AEROBIC AND ANAEROBIC Blood Culture adequate volume Performed at [facility], [address], [place], [place] [address]    Culture      NO GROWTH < 24 HOURS Performed at [facility], [address] [place], [place] [address]    Report Status PENDING   Magnesium     Status: None   Collection Time: 12/10/22  5:10 PM  Result Value Ref Range   Magnesium 2.4 1.7 - 2.4 mg/dL    Comment: Performed at [facility], [address] [place], [place] [address]  I-Stat Lactic Acid, ED     Status: None   Collection Time: 12/10/22  5:36 PM  Result Value Ref Range   Lactic Acid, Venous 1.3 0.5 - 1.9 mmol/L  Sodium, urine, random     Status: None   Collection Time: 12/10/22  8:11 PM  Result Value Ref Range   Sodium, Ur 38 mmol/L    Comment: Performed at [facility], [address], [place], [place] [address]  Creatinine, urine, random     Status: None   Collection Time: 12/10/22  8:11 PM  Result Value Ref Range   Creatinine, Urine 136 mg/dL    Comment: Performed at [facility], [address], [place], [place] [address]  Resp panel by RT-PCR (RSV, Flu A&B, Covid) Anterior Nasal Swab     Status: None   Collection Time: 12/10/22  8:31 PM   Specimen: Anterior Nasal Swab  Result Value Ref Range   SARS Coronavirus 2 by RT PCR NEGATIVE NEGATIVE    Comment: (NOTE) SARS-CoV-2 target nucleic acids are NOT DETECTED.  The SARS-CoV-2 RNA is generally detectable in upper respiratory specimens during the acute phase of infection. The lowest concentration of SARS-CoV-2 viral copies this assay can detect is 138 copies/mL. A negative result does not preclude SARS-Cov-2 infection and should not be used as the sole basis for treatment or other patient management decisions. A negative result may occur with  improper specimen  collection/handling, submission of specimen other than nasopharyngeal swab, presence of viral mutation(s) within the areas targeted by this assay, and inadequate number of viral copies(<138 copies/mL). A negative result must be combined with clinical observations, patient history, and epidemiological information. The expected result is Negative.  Fact Sheet for Patients:  BloggerCourse.com  Fact Sheet for Healthcare Providers:  SeriousBroker.it  This test is no t yet approved or cleared by the [place] FDA and  has been authorized for detection and/or diagnosis of SARS-CoV-2 by FDA under an Emergency Use Authorization (EUA). This EUA will remain  in effect (meaning this test can be used) for the duration of the COVID-19 declaration under Section 564(b)(1) of the Act, 21 U.S.C.section 360bbb-3(b)(1), unless the authorization is terminated  or revoked sooner.       Influenza A by PCR NEGATIVE NEGATIVE   Influenza B by PCR NEGATIVE NEGATIVE    Comment: (NOTE) The Xpert Xpress SARS-CoV-2/FLU/RSV plus assay is intended as an aid in the diagnosis of influenza from Nasopharyngeal swab specimens and should not be used as a sole basis for treatment. Nasal washings and aspirates are unacceptable for Xpert Xpress SARS-CoV-2/FLU/RSV testing.  Fact Sheet for Patients: BloggerCourse.com  Fact Sheet for Healthcare Providers: SeriousBroker.it  This test is not yet approved or cleared  by the [place] and has been authorized for detection and/or diagnosis of SARS-CoV-2 by FDA under an Emergency Use Authorization (EUA). This EUA will remain in effect (meaning this test can be used) for the duration of the COVID-19 declaration under Section 564(b)(1) of the Act, 21 U.S.C. section 360bbb-3(b)(1), unless the authorization is terminated or revoked.     Resp Syncytial Virus by PCR  NEGATIVE NEGATIVE    Comment: (NOTE) Fact Sheet for Patients: BloggerCourse.com  Fact Sheet for Healthcare Providers: SeriousBroker.it  This test is not yet approved or cleared by the [place] FDA and has been authorized for detection and/or diagnosis of SARS-CoV-2 by FDA under an Emergency Use Authorization (EUA). This EUA will remain in effect (meaning this test can be used) for the duration of the COVID-19 declaration under Section 564(b)(1) of the Act, 21 U.S.C. section 360bbb-3(b)(1), unless the authorization is terminated or revoked.  Performed at [facility], [address] [place], [place] [address]   Basic metabolic panel     Status: Abnormal   Collection Time: 12/11/22  7:35 AM  Result Value Ref Range   Sodium 136 135 - 145 mmol/L   Potassium 2.4 (LL) 3.5 - 5.1 mmol/L    Comment: CRITICAL RESULT CALLED TO, READ BACK BY AND VERIFIED WITH Sonyia Muro, L RN AT [phone] ON 12/11/2022 BY Milly Jakob    Chloride 102 98 - 111 mmol/L   CO2 17 (L) 22 - 32 mmol/L   Glucose, Bld 163 (H) 70 - 99 mg/dL    Comment: Glucose reference range applies only to samples taken after fasting for at least 8 hours.   BUN 97 (H) 8 - 23 mg/dL   Creatinine, Ser 6.38 (H) 0.44 - 1.00 mg/dL   Calcium 6.9 (L) 8.9 - 10.3 mg/dL   GFR, Estimated 14 (L) >60 mL/min    Comment: (NOTE) Calculated using the CKD-EPI Creatinine Equation (2021)    Anion gap 17 (H) 5 - 15    Comment: Performed at [facility], [address] [place], [place] [address]  CBC     Status: Abnormal   Collection Time: 12/11/22  7:35 AM  Result Value Ref Range   WBC 6.3 4.0 - 10.5 K/uL   RBC 3.90 3.87 - 5.11 MIL/uL   Hemoglobin 11.2 (L) 12.0 - 15.0 g/dL   HCT 32.9 (L) 51.8 - 84.1 %   MCV 82.6 80.0 - 100.0 fL   MCH 28.7 26.0 - 34.0 pg   MCHC 34.8 30.0 - 36.0 g/dL   RDW 66.0 63.0 - 16.0 %   Platelets 200 150 - 400 K/uL   nRBC 0.0 0.0 - 0.2 %     Comment: Performed at [facility], [address], [place], [place] [address]  CBG monitoring, ED     Status: Abnormal   Collection Time: 12/11/22  9:03 AM  Result Value Ref Range   Glucose-Capillary 154 (H) 70 - 99 mg/dL    Comment: Glucose reference range applies only to samples taken after fasting for at least 8 hours.   US RENAL  Result Date: 12/10/2022 CLINICAL DATA:  Acute renal injury EXAM: RENAL / URINARY TRACT ULTRASOUND COMPLETE COMPARISON:  06/04/2015 FINDINGS: Right Kidney: Renal measurements: 11.9 x 5.6 x 5.3 cm = volume: 184.8 mL. Normal renal cortical echotexture. 5.2 x 3.5 x 4.7 cm simple appearing cyst lower pole right kidney. No evidence of hydronephrosis or nephrolithiasis. Left Kidney: Renal measurements: 11.4 x 5.1 x 6.0  cm = volume: 183.1 mL. Duplicated left renal collecting system again noted. Normal renal cortical echotexture. 3.0 x 3.4 x 3.5 cm simple appearing cyst within the lower pole. No hydronephrosis or nephrolithiasis. Bladder: Appears normal for degree of bladder distention. Other: None. IMPRESSION: 1. Duplicated left renal collecting system. 2. Simple bilateral renal cysts do not require specific imaging follow-up. 3. Otherwise unremarkable renal ultrasound. Electronically Signed   By: Sharlet Salina M.D.   On: 12/10/2022 23:02   DG Chest Port 1 View  Result Date: 12/10/2022 CLINICAL DATA:  Sepsis, lethargy EXAM: PORTABLE CHEST 1 VIEW COMPARISON:  08/31/2018 FINDINGS: Faint patchy left midlung opacity, suggesting mild left upper lobe infection/pneumonia. Right lung is clear. No pleural effusion or pneumothorax. The heart is normal in size. IMPRESSION: Faint patchy left midlung opacity, suggesting mild left upper lobe infection/pneumonia. Electronically Signed   By: Charline Bills M.D.   On: 12/10/2022 17:57    Pending Labs Unresulted Labs (From admission, onward)     Start     Ordered   12/12/22 0500  Magnesium  Tomorrow morning,   R         12/11/22 1013   12/12/22 0500  Basic metabolic panel  Daily,   R      12/11/22 1013   12/12/22 0500  CBC with Differential/Platelet  Daily,   R      12/11/22 1013   12/10/22 1625  Blood Culture (routine x 2)  (Undifferentiated presentation (screening labs and basic nursing orders))  BLOOD CULTURE X 2,   STAT      12/10/22 1625            Vitals/Pain Today's Vitals   12/11/22 0510 12/11/22 0518 12/11/22 0830 12/11/22 0900  BP: (!) 90/48  111/60 (!) 119/52  Pulse: 60  60 (!) 59  Resp: 17  16 16   Temp:  (!) 97.5 F (36.4 C)    TempSrc:  Oral    SpO2: 99%  97% 96%  Weight:      Height:      PainSc:        Isolation Precautions No active isolations  Medications Medications  heparin injection 5,000 Units (5,000 Units Subcutaneous Given 12/11/22 0510)  sodium chloride flush (NS) 0.9 % injection 3 mL (3 mLs Intravenous Given 12/11/22 1020)  acetaminophen (TYLENOL) tablet 650 mg (has no administration in time range)    Or  acetaminophen (TYLENOL) suppository 650 mg (has no administration in time range)  ondansetron (ZOFRAN) tablet 4 mg (has no administration in time range)    Or  ondansetron (ZOFRAN) injection 4 mg (has no administration in time range)  senna-docusate (Senokot-S) tablet 1 tablet (has no administration in time range)  sodium bicarbonate 150 mEq in sterile water 1,150 mL infusion ( Intravenous New Bag/Given 12/10/22 2201)  rosuvastatin (CRESTOR) tablet 5 mg (has no administration in time range)  insulin aspart (novoLOG) injection 0-9 Units (2 Units Subcutaneous Given 12/11/22 0921)  potassium chloride SA (KLOR-CON M) CR tablet 40 mEq (40 mEq Oral Given 12/11/22 1019)  sodium chloride 0.9 % bolus 1,000 mL (0 mLs Intravenous Stopped 12/10/22 2012)  potassium chloride 10 mEq in 100 mL IVPB (0 mEq Intravenous Stopped 12/10/22 2057)  sodium chloride 0.9 % bolus 1,000 mL (0 mLs Intravenous Stopped 12/10/22 2100)  cefTRIAXone (ROCEPHIN) 1 g in sodium chloride 0.9 % 100 mL  IVPB (0 g Intravenous Stopped 12/10/22 2058)  azithromycin (ZITHROMAX) tablet 500 mg (500 mg Oral Given 12/10/22 1937)  potassium chloride (  KLOR-CON) packet 40 mEq (40 mEq Oral Given 12/10/22 2126)  sodium chloride 0.9 % bolus 1,000 mL (0 mLs Intravenous Stopped 12/11/22 0547)    Mobility walks with device

## 2022-12-11 NOTE — ED Notes (Signed)
Pt systolic pressure 87. MD notified. Meds ordered

## 2022-12-11 NOTE — ED Provider Notes (Signed)
I was called to evaluate patient for hypotension She is admitted for CAP and dehydration/AKI Pt awake/alert, reports feeling weak but not pain SBP 90 IV fluids ordered   Susan Rhine, MD 12/11/22 [phone]

## 2022-12-12 DIAGNOSIS — N179 Acute kidney failure, unspecified: Secondary | ICD-10-CM | POA: Diagnosis not present

## 2022-12-12 LAB — BASIC METABOLIC PANEL
Anion gap: 12 (ref 5–15)
BUN: 80 mg/dL — ABNORMAL HIGH (ref 8–23)
CO2: 24 mmol/L (ref 22–32)
Calcium: 7 mg/dL — ABNORMAL LOW (ref 8.9–10.3)
Chloride: 101 mmol/L (ref 98–111)
Creatinine, Ser: 1.74 mg/dL — ABNORMAL HIGH (ref 0.44–1.00)
GFR, Estimated: 30 mL/min — ABNORMAL LOW (ref >60)
Glucose, Bld: 233 mg/dL — ABNORMAL HIGH (ref 70–99)
Potassium: 3.1 mmol/L — ABNORMAL LOW (ref 3.5–5.1)
Sodium: 137 mmol/L (ref 135–145)

## 2022-12-12 LAB — CBC WITH DIFFERENTIAL/PLATELET
Abs Immature Granulocytes: 0.09 10*3/uL — ABNORMAL HIGH (ref 0.00–0.07)
Basophils Absolute: 0 10*3/uL (ref 0.0–0.1)
Basophils Relative: 0 %
Eosinophils Absolute: 0.1 10*3/uL (ref 0.0–0.5)
Eosinophils Relative: 2 %
HCT: 31.3 % — ABNORMAL LOW (ref 36.0–46.0)
Hemoglobin: 10.7 g/dL — ABNORMAL LOW (ref 12.0–15.0)
Immature Granulocytes: 2 %
Lymphocytes Relative: 15 %
Lymphs Abs: 0.6 10*3/uL — ABNORMAL LOW (ref 0.7–4.0)
MCH: 28.5 pg (ref 26.0–34.0)
MCHC: 34.2 g/dL (ref 30.0–36.0)
MCV: 83.5 fL (ref 80.0–100.0)
Monocytes Absolute: 0.5 10*3/uL (ref 0.1–1.0)
Monocytes Relative: 12 %
Neutro Abs: 2.9 10*3/uL (ref 1.7–7.7)
Neutrophils Relative %: 69 %
Platelets: 208 10*3/uL (ref 150–400)
RBC: 3.75 MIL/uL — ABNORMAL LOW (ref 3.87–5.11)
RDW: 14.6 % (ref 11.5–15.5)
WBC: 4.2 10*3/uL (ref 4.0–10.5)
nRBC: 0 % (ref 0.0–0.2)

## 2022-12-12 LAB — GLUCOSE, CAPILLARY
Glucose-Capillary: 203 mg/dL — ABNORMAL HIGH (ref 70–99)
Glucose-Capillary: 259 mg/dL — ABNORMAL HIGH (ref 70–99)
Glucose-Capillary: 229 mg/dL — ABNORMAL HIGH (ref 70–99)
Glucose-Capillary: 255 mg/dL — ABNORMAL HIGH (ref 70–99)

## 2022-12-12 LAB — MAGNESIUM: Magnesium: 2 mg/dL (ref 1.7–2.4)

## 2022-12-12 MED ORDER — METOPROLOL TARTRATE 50 MG PO TABS
100.0000 mg | ORAL_TABLET | Freq: Two times a day (BID) | ORAL | Status: DC
  Administered 2022-12-12 – 2022-12-14 (×4): 100 mg via ORAL
  Filled 2022-12-12 (×4): qty 2

## 2022-12-12 MED ORDER — GLIMEPIRIDE 2 MG PO TABS
2.0000 mg | ORAL_TABLET | Freq: Every day | ORAL | Status: DC
  Administered 2022-12-13 – 2022-12-14 (×2): 2 mg via ORAL
  Filled 2022-12-12 (×2): qty 1

## 2022-12-12 MED ORDER — AMLODIPINE BESYLATE 10 MG PO TABS
5.0000 mg | ORAL_TABLET | Freq: Two times a day (BID) | ORAL | Status: DC
  Administered 2022-12-12 – 2022-12-14 (×4): 5 mg via ORAL
  Filled 2022-12-12 (×4): qty 1

## 2022-12-12 MED ORDER — GABAPENTIN 100 MG PO CAPS
200.0000 mg | ORAL_CAPSULE | Freq: Two times a day (BID) | ORAL | Status: DC
  Administered 2022-12-12 – 2022-12-14 (×4): 200 mg via ORAL
  Filled 2022-12-12 (×4): qty 2

## 2022-12-12 NOTE — Plan of Care (Signed)
  Problem: Coping: Goal: Ability to adjust to condition or change in health will improve Outcome: Progressing   Problem: Metabolic: Goal: Ability to maintain appropriate glucose levels will improve Outcome: Progressing   Problem: Education: Goal: Ability to describe self-care measures that may prevent or decrease complications (Diabetes Survival Skills Education) will improve Outcome: Progressing   Problem: Education: Goal: Ability to describe self-care measures that may prevent or decrease complications (Diabetes Survival Skills Education) will improve Outcome: Progressing   Problem: Coping: Goal: Ability to adjust to condition or change in health will improve Outcome: Progressing   Problem: Health Behavior/Discharge Planning: Goal: Ability to identify and utilize available resources and services will improve Outcome: Progressing   Problem: Metabolic: Goal: Ability to maintain appropriate glucose levels will improve Outcome: Progressing   Problem: Nutritional: Goal: Maintenance of adequate nutrition will improve Outcome: Progressing

## 2022-12-12 NOTE — Progress Notes (Signed)
HOSPITALIST ROUNDING NOTE Susan Turner WUJ:[id number]  DOB: 1945/09/26  DOA: 12/10/2022  PCP: Wilfrid Lund, PA  12/12/2022,2:06 PM   LOS: 2 days      Code Status: full  From:  home    Current Dispo: unclear     36 uses wheelchair baseline occasional walker wf R tkr in the past with revision 04/05/2022,--- this was followed previously by Dr. 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  LUL opacity on CXR and was started in ED on 1 dose of ceftriaxone azithromycin which was held  Urine sodium 38 urine creatinine 136--FeNA 1.2%--- renal ultrasound showed duplicated left renal collecting system simple bilateral renal cysts not requiring further follow-up otherwise unremarkable no hydronephrosis Bladder scan pending, renal ultrasound pending 12/6 US renal duplicated left renal collecting system simple bilateral renal cysts otherwise unremarkable   Plan  Metabolic encephalopathy on admission Mild and seems to be completely improved-combination of AKI and various meds cautiously resume gabapentin at 200 twice daily, completely hold tramadol and Robaxin for now Reimplement as outpatient  Hypovolemic hyponatremia from history Severe metabolic abnormalities AKI metabolic acidosis--?  Obstructive uropathy-FENa 1.2%-PTA ARB, chlorthalidone 12.5, a VaPro 300 Stopped ARB and chlorthalidone Hypokalemia-K-Dur 40 twice daily magnesium normal Hyponatremia-resolved Acidosis-secondary to diarrheal losses AKI  resolved  Diarrheal illness Completely resolved--no further workup  PTA UTI Nitrites negative leukocytes large hemoglobin moderate--- was being treated empirically in the outpatient setting-meds have been called in for patient no urine analysis was done -- afebrile at this time, hold antibiotics  No pneumonia X-ray nonconcerning for pneumonia hold antibiotics  DM TY 2 HLD Sliding scale coverage at this, does not appear to be on any meds for this-will need to implement low-dose medication as sugars are ranging between 202 50 Start Amaryl 2 mg in a.m. and graduate-needs education and glucometer in outpatient setting   HTN uncontrolled increase metoprolol back to 100 twice daily, resume amlodipine 5 twice daily  Prior PJI infection status post revision status post previous treatment with IV antibiotics Stable  No family present at this time--called and discussed with daughter on phone 12/8-she expresses concern because of patient's poor overall function declining status and inability to take care of self PT consult elicited  DVT prophylaxis: Improved  Status is: Inpatient Remains inpatient appropriate because:   Needs improvement of kidney function Ambulate in hallway when able      Subjective:  Current awake alert no distress seems okay no chest pain no fever Passing urine not confused   Objective + exam Vitals:   12/11/22 2044 12/12/22 0011 12/12/22 0453 12/12/22 1229  BP: (!) 109/54 (!) 115/55 (!) 113/54 (!) 122/58  Pulse: 79 78 79 82  Resp: 20 20 20 16   Temp: 98.2 F (36.8 C)  97.8 F (36.6 C) 97.6 F (36.4 C)  TempSrc: Oral  Oral Oral  SpO2: 100% 94% 96% 100%  Weight:   98.8 kg   Height:       Filed Weights   12/10/22 1555 12/10/22 1723 12/12/22 0453  Weight: 90.7 kg 90.7 kg 98.8 kg  Examination:  EOMI NCAT no focal deficit no icterus no pallor Chest No wheeze rales rhonchi Large lipoma upper back Abdomen soft Straight leg raise bilaterally  intact S1-S2 no murmur-telemetry PVCs with trigeminy  Data Reviewed: reviewed   CBC    Component Value Date/Time   WBC 4.2 12/12/2022 0357   RBC 3.75 (L) 12/12/2022 0357   HGB 10.7 (L) 12/12/2022 0357   HCT 31.3 (L) 12/12/2022 0357   PLT 208 12/12/2022 0357   MCV 83.5 12/12/2022 0357   MCV 83.4 05/31/2015 1517   MCH 28.5 12/12/2022 0357   MCHC 34.2 12/12/2022 0357   RDW 14.6 12/12/2022 0357   LYMPHSABS 0.6 (L) 12/12/2022 0357   MONOABS 0.5 12/12/2022 0357   EOSABS 0.1 12/12/2022 0357   BASOSABS 0.0 12/12/2022 0357      Latest Ref Rng & Units 12/12/2022    3:57 AM 12/11/2022    7:35 AM 12/10/2022    5:10 PM  CMP  Glucose 70 - 99 mg/dL 161  096  045   BUN 8 - 23 mg/dL 80  97  409   Creatinine 0.44 - 1.00 mg/dL 8.11  9.14  7.82   Sodium 135 - 145 mmol/L 137  136  130   Potassium 3.5 - 5.1 mmol/L 3.1  2.4  2.3   Chloride 98 - 111 mmol/L 101  102  94   CO2 22 - 32 mmol/L 24  17  19    Calcium 8.9 - 10.3 mg/dL 7.0  6.9  7.2   Total Protein 6.5 - 8.1 g/dL   6.4   Total Bilirubin <1.2 mg/dL   0.9   Alkaline Phos 38 - 126 U/L   92   AST 15 - 41 U/L   16   ALT 0 - 44 U/L   18      Scheduled Meds:  heparin  5,000 Units Subcutaneous Q8H   insulin aspart  0-9 Units Subcutaneous TID WC   potassium chloride  40 mEq Oral BID   rosuvastatin  5 mg Oral Weekly   sodium chloride flush  3 mL Intravenous Q12H   Continuous Infusions:    Time  57  Rhetta Mura, MD  [facility]

## 2022-12-12 NOTE — Evaluation (Signed)
Physical Therapy Evaluation Patient Details Name: [name] MRN: [id number] DOB: 09-20-1945 Today's Date: 12/12/2022  History of Present Illness  77 y.o. female  presented to the ED for evaluation of diarrhea, fatigue, lightheadedness.  medical history significant for T2DM, HTN, HLD, iron deficiency anemia, osteoarthritis, R TKA, R TKA revision.  Clinical Impression  Pt admitted with above diagnosis.  Pt lives alone and is typically household ambulator, uses RW. Pt agreeable to PT however limited by L knee pain/instability and fatigue. Pt states "I am  so weak" Pt is currently much weaker than her baseline. Performed STS x2 with min assist, unable to wt shift to take steps to recliner.  Patient will benefit from continued  follow up therapy, <3 hours/day at d/c    Pt currently with functional limitations due to the deficits listed below (see PT Problem List). Pt will benefit from acute skilled PT to increase their independence and safety with mobility to allow discharge.           If plan is discharge home, recommend the following: Two people to help with walking and/or transfers;A lot of help with bathing/dressing/bathroom;Assistance with cooking/housework;Assist for transportation;Help with stairs or ramp for entrance   Can travel by private vehicle   Yes    Equipment Recommendations None recommended by PT  Recommendations for Other Services       Functional Status Assessment Patient has had a recent decline in their functional status and demonstrates the ability to make significant improvements in function in a reasonable and predictable amount of time.     Precautions / Restrictions Precautions Precautions: Fall Restrictions Weight Bearing Restrictions: No      Mobility  Bed Mobility Overal bed mobility: Needs Assistance Bed Mobility: Supine to Sit, Sit to Supine     Supine to sit: Contact guard, Used rails Sit to supine: Contact guard assist, Used rails    General bed mobility comments: incr time and effort, bed rails utilized    Transfers Overall transfer level: Needs assistance Equipment used: Rolling walker (2 wheels) Transfers: Sit to/from Stand Sit to Stand: Min assist           General transfer comment: STS x2 assist to rise and transition to RW, maintains L knee in flexion, unable to extend or wt shift in standing d/t knee pain/instability; scoots laterally along EOB with CGA    Ambulation/Gait                  Stairs            Wheelchair Mobility     Tilt Bed    Modified Rankin (Stroke Patients Only)       Balance                                             Pertinent Vitals/Pain Pain Assessment Pain Assessment: Faces Faces Pain Scale: Hurts even more Pain Location: L knee Pain Descriptors / Indicators: Grimacing, Sore Pain Intervention(s): Limited activity within patient's tolerance, Monitored during session, Repositioned    Home Living Family/patient expects to be discharged to:: Private residence Living Arrangements: Alone Available Help at Discharge: Family;Available 24 hours/day;Personal care attendant Type of Home: House Home Access: Level entry       Home Layout: Two level Home Equipment: Agricultural consultant (2 wheels);Wheelchair - manual      Prior Function Prior Level of  Function : Independent/Modified Independent             Mobility Comments: amb with RW household distance       Extremity/Trunk Assessment   Upper Extremity Assessment Upper Extremity Assessment: Defer to OT evaluation    Lower Extremity Assessment Lower Extremity Assessment: Generalized weakness;LLE deficits/detail LLE Deficits / Details: grossly 3 to  3+/5, AROM WFL (painful knee at baseline) LLE: Unable to fully assess due to pain       Communication      Cognition Arousal: Alert Behavior During Therapy: WFL for tasks assessed/performed, Flat affect Overall Cognitive  Status: Within Functional Limits for tasks assessed                                          General Comments      Exercises     Assessment/Plan    PT Assessment Patient needs continued PT services  PT Problem List Decreased strength;Decreased activity tolerance;Decreased balance;Decreased mobility;Decreased knowledge of use of DME;Pain       PT Treatment Interventions DME instruction;Gait training;Functional mobility training;Therapeutic activities;Therapeutic exercise;Patient/family education;Balance training    PT Goals (Current goals can be found in the Care Plan section)  Acute Rehab PT Goals Patient Stated Goal: be able to walk PT Goal Formulation: With patient Time For Goal Achievement: 12/19/22 Potential to Achieve Goals: Good    Frequency Min 1X/week     Co-evaluation               AM-PAC PT "6 Clicks" Mobility  Outcome Measure Help needed turning from your back to your side while in a flat bed without using bedrails?: A Little Help needed moving from lying on your back to sitting on the side of a flat bed without using bedrails?: A Little Help needed moving to and from a bed to a chair (including a wheelchair)?: A Lot Help needed standing up from a chair using your arms (e.g., wheelchair or bedside chair)?: A Little Help needed to walk in hospital room?: Total Help needed climbing 3-5 steps with a railing? : Total 6 Click Score: 13    End of Session Equipment Utilized During Treatment: Gait belt Activity Tolerance: Patient limited by fatigue;Patient limited by pain Patient left: with call bell/phone within reach;in bed;with bed alarm set Nurse Communication: Mobility status PT Visit Diagnosis: Other abnormalities of gait and mobility (R26.89);Difficulty in walking, not elsewhere classified (R26.2)    Time: 6295-2841 PT Time Calculation (min) (ACUTE ONLY): 22 min   Charges:   PT Evaluation $PT Eval Low Complexity: 1 Low   PT  General Charges $$ ACUTE PT VISIT: 1 Visit         Susan Turner, PT  Acute Rehab Dept [facility]) [phone]  12/12/2022   [facility] 12/12/2022, 4:57 PM

## 2022-12-13 DIAGNOSIS — N179 Acute kidney failure, unspecified: Secondary | ICD-10-CM | POA: Diagnosis not present

## 2022-12-13 LAB — BASIC METABOLIC PANEL
Anion gap: 9 (ref 5–15)
BUN: 47 mg/dL — ABNORMAL HIGH (ref 8–23)
CO2: 24 mmol/L (ref 22–32)
Calcium: 7.6 mg/dL — ABNORMAL LOW (ref 8.9–10.3)
Chloride: 104 mmol/L (ref 98–111)
Creatinine, Ser: 0.97 mg/dL (ref 0.44–1.00)
GFR, Estimated: >60 mL/min (ref >60)
Glucose, Bld: 216 mg/dL — ABNORMAL HIGH (ref 70–99)
Potassium: 3.5 mmol/L (ref 3.5–5.1)
Sodium: 137 mmol/L (ref 135–145)

## 2022-12-13 LAB — GLUCOSE, CAPILLARY
Glucose-Capillary: 208 mg/dL — ABNORMAL HIGH (ref 70–99)
Glucose-Capillary: 206 mg/dL — ABNORMAL HIGH (ref 70–99)
Glucose-Capillary: 171 mg/dL — ABNORMAL HIGH (ref 70–99)
Glucose-Capillary: 202 mg/dL — ABNORMAL HIGH (ref 70–99)

## 2022-12-13 MED ORDER — LIVING WELL WITH DIABETES BOOK
Freq: Once | Status: AC
  Filled 2022-12-13: qty 1

## 2022-12-13 NOTE — TOC Initial Note (Addendum)
Transition of Care [facility]) - Initial/Assessment Note    Patient Details  Name: Susan Turner MRN: [id number] Date of Birth: 16-Dec-1945  [facility]) CM/SW Contact:    Howell Rucks, RN Phone Number: 12/13/2022, 12:18 PM  Clinical Narrative:   Met with pt at bedside to introduce role of TOC/NCM and review for dc planning, PT recommendation for short term rehab/SNF, pt agreeable, no preference. Pt has an established PCP and pharmacy, no current home care services, home DME: chair lift, BSC, Rollator, RW, wheelchair. Pt gave verbal ok for NCM to speak with her dtr. NCM call to pt's dtr [name]), introduced self and role of TOC/NCM, Fransisca Kaufmann agreeable to short term rehab for pt, prefers [facility] as it close to pt's home, prefers local [place] facilities. Fransisca Kaufmann reports pt has been nonambulatory for approx 1 year, currently w/c bound, reports pt does not attempt to walk, reports pt has a friend Dorene Sorrow) who takes pt to appts, grocery store, etc as needed. NCM will complete PASRR, FL2 and fax out for bed offers      -2:12pm PASRR, FL2 completed, faxed out for bed offers.             Expected Discharge Plan: Skilled Nursing Facility Barriers to Discharge: Continued Medical Work up   Patient Goals and CMS Choice Patient states their goals for this hospitalization and ongoing recovery are:: short term rehab facility before return to home          Expected Discharge Plan and Services       Living arrangements for the past 2 months: Single Family Home                                      Prior Living Arrangements/Services Living arrangements for the past 2 months: Single Family Home Lives with:: Self Patient language and need for interpreter reviewed:: Yes Do you feel safe going back to the place where you live?: No   generalized weakness  Need for Family Participation in Patient Care: Yes (Comment) Care giver support system in place?: Yes  (comment) Current home services: DME (BSC, Rollator. RW, wheelchair) Criminal Activity/Legal Involvement Pertinent to Current Situation/Hospitalization: No - Comment as needed  Activities of Daily Living   ADL Screening (condition at time of admission) Independently performs ADLs?: Yes (appropriate for developmental age) Is the patient deaf or have difficulty hearing?: No Does the patient have difficulty seeing, even when wearing glasses/contacts?: No Does the patient have difficulty concentrating, remembering, or making decisions?: No  Permission Sought/Granted Permission sought to share information with : Family Supports (dtr Fransisca Kaufmann)) Permission granted to share information with : Yes, Verbal Permission Granted  Share Information with NAME: [name] (dtr)           Emotional Assessment Appearance:: Appears stated age Attitude/Demeanor/Rapport: Gracious Affect (typically observed): Accepting Orientation: : Oriented to Self, Oriented to Place, Oriented to  Time, Oriented to Situation Alcohol / Substance Use: Not Applicable Psych Involvement: No (comment)  Admission diagnosis:  Dehydration [E86.0] AKI (acute kidney injury) (HCC) [N17.9] Acute kidney injury (HCC) [N17.9] Community acquired pneumonia, unspecified laterality [J18.9] Patient Active Problem List   Diagnosis Date Noted   Acute kidney injury (HCC) 12/10/2022   Hypokalemia 12/10/2022   Hyponatremia 12/10/2022   Type 2 diabetes mellitus (HCC) 12/10/2022   Hypertension associated with diabetes (HCC) 12/10/2022   Hyperlipidemia associated with type 2 diabetes  mellitus (HCC) 12/10/2022   S/P revision of total knee, right 04/02/2022   Prosthetic joint infection, initial encounter (HCC) 12/14/2021   Infection of prosthetic right knee joint (HCC) 12/10/2021   Essential hypertension 05/31/2015   Hyperglycemia 05/31/2015   Arthritis 05/31/2015   Gallstone 05/31/2015   PCP:  Wilfrid Lund, PA Pharmacy:    [facility] [phone] [place], [place] - [address] [place] [address] Phone: [phone] Fax: [phone]  [facility] - [place], [place] - [address] [place] [address] Phone: [phone] Fax: [phone]     Social Determinants of Health (SDOH) Social History: SDOH Screenings   Food Insecurity: No Food Insecurity (12/11/2022)  Housing: Low Risk  (12/11/2022)  Transportation Needs: No Transportation Needs (12/11/2022)  Utilities: Not At Risk (12/11/2022)  Depression (PHQ2-9): Low Risk  (01/19/2022)  Social Connections: Unknown (05/19/2021)   Received from [facility]  Tobacco Use: Low Risk  (12/11/2022)   SDOH Interventions:     Readmission Risk Interventions    12/13/2022   12:15 PM  Readmission Risk Prevention Plan  Transportation Screening Complete  PCP or Specialist Appt within 5-7 Days Complete  Home Care Screening Complete  Medication Review (RN CM) Complete

## 2022-12-13 NOTE — Plan of Care (Signed)
  Problem: Health Behavior/Discharge Planning: Goal: Ability to manage health-related needs will improve Outcome: Progressing   Problem: Skin Integrity: Goal: Risk for impaired skin integrity will decrease Outcome: Progressing   Problem: Clinical Measurements: Goal: Diagnostic test results will improve Outcome: Progressing Goal: Respiratory complications will improve Outcome: Progressing   Problem: Elimination: Goal: Will not experience complications related to urinary retention Outcome: Progressing   Problem: Safety: Goal: Ability to remain free from injury will improve Outcome: Progressing   Problem: Skin Integrity: Goal: Risk for impaired skin integrity will decrease Outcome: Progressing

## 2022-12-13 NOTE — Progress Notes (Signed)
HOSPITALIST ROUNDING NOTE Susan Turner WGN:[id number]  DOB: [birth year]  DOA: 12/10/2022  PCP: Wilfrid Lund, PA  12/13/2022,1:35 PM   LOS: 3 days      Code Status: full  From:  home    Current Dispo: unclear     32 uses wheelchair baseline occasional walker wf R tkr in the past with revision 04/05/2022,--- this was followed previously by Dr. 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  Obstructive uropathy-FENa 1.2%-PTA , chlorthalidone 12.5, a VaPro 300 Stopped ARB and chlorthalidone Hypokalemia-K-Dur 40 twice daily  Hyponatremia-resolved Acidosis- 2/2 diarrheal losses AKI  resolved  Diarrheal illness Completely resolved--no further workup  PTA UTI Nitrites negative leukocytes large hemoglobin moderate--- was being treated empirically in the outpatient setting-meds have been called in for patient no urine analysis was done -- afebrile at this time, hold antibiotics  No pneumonia X-ray nonconcerning for pneumonia hold antibiotics  DM TY 2 HLD Sliding scale coverage at this, does not appear to be on any meds for this-will need to implement low-dose medication as sugars are ranging between 202 50 Start Amaryl 2 mg in a.m. and graduate-needs education and glucometer in outpatient setting   HTN Better controlled back on metoprolol back to 100 twice daily, resume amlodipine 5 twice daily  Prior PJI infection status post revision status post previous treatment with IV antibiotics Stable  discussed with daughter on phone 12/8-she expresses concern because of patient's poor overall function Await skilled facility-stable for discharge  DVT prophylaxis: Improved  Status is: Inpatient Remains inpatient appropriate because:   Needs improvement of kidney function Ambulate in hallway when able      Subjective:  Awake coherent no distress looks better Eating drinking No fever chills no chest pain no nausea   Objective + exam Vitals:   12/13/22 0548 12/13/22 0703 12/13/22 0822 12/13/22 1128  BP: 118/60  118/62 123/69  Pulse: 65  64 61  Resp: 19     Temp: 97.7 F (36.5 C)   98 F (36.7 C)  TempSrc: Oral   Oral  SpO2: 98%   98%  Weight:  98.3 kg    Height:       Filed Weights   12/10/22 1723 12/12/22 0453 12/13/22 0703  Weight: 90.7 kg 98.8 kg 98.3 kg    Examination:  EOMI NCAT no focal deficit no icterus no pallor Chest--No wheeze rales rhonchi Large  lipoma upper back Abdomen soft Power 5/5  Data Reviewed: reviewed   CBC    Component Value Date/Time   WBC 4.2 12/12/2022 0357   RBC 3.75 (L) 12/12/2022 0357   HGB 10.7 (L)  12/12/2022 0357   HCT 31.3 (L) 12/12/2022 0357   PLT 208 12/12/2022 0357   MCV 83.5 12/12/2022 0357   MCV 83.4 05/31/2015 1517   MCH 28.5 12/12/2022 0357   MCHC 34.2 12/12/2022 0357   RDW 14.6 12/12/2022 0357   LYMPHSABS 0.6 (L) 12/12/2022 0357   MONOABS 0.5 12/12/2022 0357   EOSABS 0.1 12/12/2022 0357   BASOSABS 0.0 12/12/2022 0357      Latest Ref Rng & Units 12/13/2022    4:23 AM 12/12/2022    3:57 AM 12/11/2022    7:35 AM  CMP  Glucose 70 - 99 mg/dL 884  166  063   BUN 8 - 23 mg/dL 47  80  97   Creatinine 0.44 - 1.00 mg/dL 0.16  0.10  9.32   Sodium 135 - 145 mmol/L 137  137  136   Potassium 3.5 - 5.1 mmol/L 3.5  3.1  2.4   Chloride 98 - 111 mmol/L 104  101  102   CO2 22 - 32 mmol/L 24  24  17    Calcium 8.9 - 10.3 mg/dL 7.6  7.0  6.9      Scheduled Meds:  amLODipine  5 mg Oral BID   gabapentin  200 mg Oral BID   glimepiride  2 mg Oral Q breakfast   heparin  5,000 Units Subcutaneous Q8H   insulin aspart  0-9 Units Subcutaneous TID WC   metoprolol tartrate  100 mg Oral BID   potassium chloride  40 mEq Oral BID   rosuvastatin  5 mg Oral Weekly   sodium chloride flush  3 mL Intravenous Q12H   Continuous Infusions:    Time  57  Rhetta Mura, MD  [facility]

## 2022-12-13 NOTE — NC FL2 (Signed)
[place] MEDICAID FL2 LEVEL OF CARE FORM     IDENTIFICATION  Patient Name: Susan Turner Birthdate: 03-19-1945 Sex: female Admission Date (Current Location): 12/10/2022  [facility] and [place] Number:  Producer, television/film/video and Address:  [facility],  [address], [place] [address]      Provider Number: [id number]  Attending Physician Name and Address:  Rhetta Mura, MD  Relative Name and Phone Number:  [name] (dtr), phone: [phone]    Current Level of Care: Hospital Recommended Level of Care: Skilled Nursing Facility Prior Approval Number:    Date Approved/Denied:   PASRR Number: [id number]  Discharge Plan: SNF    Current Diagnoses: Patient Active Problem List   Diagnosis Date Noted   Acute kidney injury (HCC) 12/10/2022   Hypokalemia 12/10/2022   Hyponatremia 12/10/2022   Type 2 diabetes mellitus (HCC) 12/10/2022   Hypertension associated with diabetes (HCC) 12/10/2022   Hyperlipidemia associated with type 2 diabetes mellitus (HCC) 12/10/2022   S/P revision of total knee, right 04/02/2022   Prosthetic joint infection, initial encounter (HCC) 12/14/2021   Infection of prosthetic right knee joint (HCC) 12/10/2021   Essential hypertension 05/31/2015   Hyperglycemia 05/31/2015   Arthritis 05/31/2015   Gallstone 05/31/2015    Orientation RESPIRATION BLADDER Height & Weight     Self, Time, Situation, Place  Normal Incontinent, External catheter Weight: 98.3 kg Height:  5\' 5"  (165.1 cm)  BEHAVIORAL SYMPTOMS/MOOD NEUROLOGICAL BOWEL NUTRITION STATUS      Continent Diet (heart healthy)  AMBULATORY STATUS COMMUNICATION OF NEEDS Skin   Extensive Assist Verbally Other (Comment) (erythema/redness bilateral buttocks)                       Personal Care Assistance Level of Assistance  Bathing, Feeding, Dressing Bathing Assistance: Limited assistance Feeding assistance: Limited assistance Dressing Assistance: Limited  assistance     Functional Limitations Info  Sight, Hearing, Speech Sight Info: Impaired (glasses) Hearing Info: Adequate      SPECIAL CARE FACTORS FREQUENCY  PT (By licensed PT), OT (By licensed OT)     PT Frequency: 5x/wk OT Frequency: 5x/wk            Contractures Contractures Info: Not present    Additional Factors Info  Code Status, Allergies, Psychotropic Code Status Info: Full Code Allergies Info: Lisinopril, Codeine, Motrin (Ibuprofen) Psychotropic Info: N/A         Current Medications (12/13/2022):  This is the current hospital active medication list Current Facility-Administered Medications  Medication Dose Route Frequency Provider Last Rate Last Admin   acetaminophen (TYLENOL) tablet 650 mg  650 mg Oral Q6H PRN Charlsie Quest, MD       Or   acetaminophen (TYLENOL) suppository 650 mg  650 mg Rectal Q6H PRN Charlsie Quest, MD       amLODipine (NORVASC) tablet 5 mg  5 mg Oral BID Rhetta Mura, MD   5 mg at 12/13/22 4401   gabapentin (NEURONTIN) capsule 200 mg  200 mg Oral BID Rhetta Mura, MD   200 mg at 12/13/22 0272   glimepiride (AMARYL) tablet 2 mg  2 mg Oral Q breakfast Rhetta Mura, MD   2 mg at 12/13/22 0822   heparin injection 5,000 Units  5,000 Units Subcutaneous Q8H Darreld Mclean R, MD   5,000 Units at 12/13/22 1213   insulin aspart (novoLOG) injection 0-9 Units  0-9 Units Subcutaneous TID WC Charlsie Quest, MD   3 Units  at 12/13/22 1214   metoprolol tartrate (LOPRESSOR) tablet 100 mg  100 mg Oral BID Rhetta Mura, MD   100 mg at 12/13/22 0822   ondansetron (ZOFRAN) tablet 4 mg  4 mg Oral Q6H PRN Charlsie Quest, MD       Or   ondansetron (ZOFRAN) injection 4 mg  4 mg Intravenous Q6H PRN Charlsie Quest, MD       potassium chloride (KLOR-CON) packet 40 mEq  40 mEq Oral BID Rhetta Mura, MD   40 mEq at 12/13/22 1610   rosuvastatin (CRESTOR) tablet 5 mg  5 mg Oral Weekly Darreld Mclean R, MD   5 mg at 12/12/22 0914    senna-docusate (Senokot-S) tablet 1 tablet  1 tablet Oral QHS PRN Charlsie Quest, MD       sodium chloride flush (NS) 0.9 % injection 3 mL  3 mL Intravenous Q12H Charlsie Quest, MD   3 mL at 12/13/22 0831     Discharge Medications: Please see discharge summary for a list of discharge medications.  Relevant Imaging Results:  Relevant Lab Results:   Additional Information SSN:[id number]  Howell Rucks, RN

## 2022-12-13 NOTE — Progress Notes (Signed)
Mobility Specialist - Progress Note   12/13/22 0950  Mobility  Activity Transferred to/from [facility]  Level of Assistance Contact guard assist, steadying assist  Assistive Device BSC  Activity Response Tolerated fair  Mobility visit 1 Mobility  Mobility Specialist Start Time (ACUTE ONLY) 0932  Mobility Specialist Stop Time (ACUTE ONLY) 0950  Mobility Specialist Time Calculation (min) (ACUTE ONLY) 18 min   Pt requesting assistance to/from [facility]. Assisted pt and at EOS was left in bed with all needs met. Call bell in reach.  Billey Chang Mobility Specialist

## 2022-12-14 DIAGNOSIS — E669 Obesity, unspecified: Secondary | ICD-10-CM | POA: Diagnosis not present

## 2022-12-14 DIAGNOSIS — R197 Diarrhea, unspecified: Secondary | ICD-10-CM | POA: Diagnosis not present

## 2022-12-14 DIAGNOSIS — G8929 Other chronic pain: Secondary | ICD-10-CM | POA: Diagnosis not present

## 2022-12-14 DIAGNOSIS — M1990 Unspecified osteoarthritis, unspecified site: Secondary | ICD-10-CM | POA: Diagnosis not present

## 2022-12-14 DIAGNOSIS — G9341 Metabolic encephalopathy: Secondary | ICD-10-CM | POA: Diagnosis not present

## 2022-12-14 DIAGNOSIS — E785 Hyperlipidemia, unspecified: Secondary | ICD-10-CM | POA: Diagnosis not present

## 2022-12-14 DIAGNOSIS — Z7401 Bed confinement status: Secondary | ICD-10-CM | POA: Diagnosis not present

## 2022-12-14 DIAGNOSIS — Z96651 Presence of right artificial knee joint: Secondary | ICD-10-CM | POA: Diagnosis not present

## 2022-12-14 DIAGNOSIS — N179 Acute kidney failure, unspecified: Secondary | ICD-10-CM | POA: Diagnosis not present

## 2022-12-14 DIAGNOSIS — Z8701 Personal history of pneumonia (recurrent): Secondary | ICD-10-CM | POA: Diagnosis not present

## 2022-12-14 DIAGNOSIS — E871 Hypo-osmolality and hyponatremia: Secondary | ICD-10-CM | POA: Diagnosis not present

## 2022-12-14 DIAGNOSIS — I152 Hypertension secondary to endocrine disorders: Secondary | ICD-10-CM | POA: Diagnosis not present

## 2022-12-14 DIAGNOSIS — R2681 Unsteadiness on feet: Secondary | ICD-10-CM | POA: Diagnosis not present

## 2022-12-14 DIAGNOSIS — Z7984 Long term (current) use of oral hypoglycemic drugs: Secondary | ICD-10-CM | POA: Diagnosis not present

## 2022-12-14 DIAGNOSIS — D649 Anemia, unspecified: Secondary | ICD-10-CM | POA: Diagnosis not present

## 2022-12-14 DIAGNOSIS — E860 Dehydration: Secondary | ICD-10-CM | POA: Diagnosis not present

## 2022-12-14 DIAGNOSIS — R2689 Other abnormalities of gait and mobility: Secondary | ICD-10-CM | POA: Diagnosis not present

## 2022-12-14 DIAGNOSIS — E876 Hypokalemia: Secondary | ICD-10-CM | POA: Diagnosis not present

## 2022-12-14 DIAGNOSIS — Z87448 Personal history of other diseases of urinary system: Secondary | ICD-10-CM | POA: Diagnosis not present

## 2022-12-14 DIAGNOSIS — E1165 Type 2 diabetes mellitus with hyperglycemia: Secondary | ICD-10-CM | POA: Diagnosis not present

## 2022-12-14 DIAGNOSIS — M25562 Pain in left knee: Secondary | ICD-10-CM | POA: Diagnosis not present

## 2022-12-14 DIAGNOSIS — M6281 Muscle weakness (generalized): Secondary | ICD-10-CM | POA: Diagnosis not present

## 2022-12-14 DIAGNOSIS — R531 Weakness: Secondary | ICD-10-CM | POA: Diagnosis not present

## 2022-12-14 DIAGNOSIS — R600 Localized edema: Secondary | ICD-10-CM | POA: Diagnosis not present

## 2022-12-14 LAB — GLUCOSE, CAPILLARY
Glucose-Capillary: 177 mg/dL — ABNORMAL HIGH (ref 70–99)
Glucose-Capillary: 219 mg/dL — ABNORMAL HIGH (ref 70–99)

## 2022-12-14 MED ORDER — GLIMEPIRIDE 2 MG PO TABS: 2.0000 mg | ORAL_TABLET | Freq: Every day | ORAL | Status: AC

## 2022-12-14 MED ORDER — GABAPENTIN 300 MG PO CAPS: 300.0000 mg | ORAL_CAPSULE | Freq: Two times a day (BID) | ORAL | 0 refills | Status: AC

## 2022-12-14 MED ORDER — ACETAMINOPHEN 325 MG PO TABS: 650.0000 mg | ORAL_TABLET | Freq: Four times a day (QID) | ORAL | Status: AC | PRN

## 2022-12-14 NOTE — Progress Notes (Signed)
Called report to [facility].IV is out.AVS papers in PTAR packet.Waiting for PTAR.

## 2022-12-14 NOTE — TOC Transition Note (Signed)
Transition of Care [facility]) - CM/SW Discharge Note   Patient Details  Name: Susan Turner MRN: [id number] Date of Birth: 1945/10/15  Transition of Care [facility]) CM/SW Contact:  Howell Rucks, RN Phone Number: 12/14/2022, 12:40 PM   Clinical Narrative: Dc order to short term rehab/SNF at [facility] [place] (dtr) notified, PTAR for transportation. No further TOC needs identified.       Final next level of care: Skilled Nursing Facility Barriers to Discharge: Barriers Resolved   Patient Goals and CMS Choice      Discharge Placement                Patient chooses bed at: [facility] Patient to be transferred to facility by: PTAR Name of family member notified: [name] (dtr) Patient and family notified of of transfer: 12/14/22  Discharge Plan and Services Additional resources added to the After Visit Summary for                                       Social Determinants of Health (SDOH) Interventions SDOH Screenings   Food Insecurity: No Food Insecurity (12/11/2022)  Housing: Low Risk  (12/11/2022)  Transportation Needs: No Transportation Needs (12/11/2022)  Utilities: Not At Risk (12/11/2022)  Depression (PHQ2-9): Low Risk  (01/19/2022)  Social Connections: Unknown (05/19/2021)   Received from [facility]  Tobacco Use: Low Risk  (12/11/2022)     Readmission Risk Interventions    12/13/2022   12:15 PM  Readmission Risk Prevention Plan  Transportation Screening Complete  PCP or Specialist Appt within 5-7 Days Complete  Home Care Screening Complete  Medication Review (RN CM) Complete

## 2022-12-14 NOTE — TOC Progression Note (Addendum)
Transition of Care [facility]) - Progression Note    Patient Details  Name: SHEYANN ALONGI MRN: [id number] Date of Birth: 1945/12/21  Transition of Care [facility]) CM/SW Contact  Howell Rucks, RN Phone Number: 12/14/2022, 10:08 AM  Clinical Narrative: NCM call to pt's dtr Fransisca Kaufmann) to review short term rehab bed offers ( [place], [place], [place], [place], [place], [employer], [facility] and [address]), dtr accepted bed offer at [facility], [facility] rep-Starr notified. [employer] Medicare  auth initiated via [place].    -10:28am Short term rehab/SNF auth initiated via [place]. Approved. Georges Mouse ID [id number], days approved 12/10 through 12/16/2022, team notified.     Expected Discharge Plan: Skilled Nursing Facility Barriers to Discharge: Continued Medical Work up  Expected Discharge Plan and Services       Living arrangements for the past 2 months: Single Family Home Expected Discharge Date: 12/12/22                                     Social Determinants of Health (SDOH) Interventions SDOH Screenings   Food Insecurity: No Food Insecurity (12/11/2022)  Housing: Low Risk  (12/11/2022)  Transportation Needs: No Transportation Needs (12/11/2022)  Utilities: Not At Risk (12/11/2022)  Depression (PHQ2-9): Low Risk  (01/19/2022)  Social Connections: Unknown (05/19/2021)   Received from [facility], [facility]  Tobacco Use: Low Risk  (12/11/2022)    Readmission Risk Interventions    12/13/2022   12:15 PM  Readmission Risk Prevention Plan  Transportation Screening Complete  PCP or Specialist Appt within 5-7 Days Complete  Home Care Screening Complete  Medication Review (RN CM) Complete

## 2022-12-14 NOTE — Care Management Important Message (Signed)
Important Message  Patient Details IM Letter given. Name: Susan Turner MRN: [id number] Date of Birth: Mar 12, 1945   Important Message Given:  Yes - Medicare IM     Caren Macadam 12/14/2022, 11:02 AM

## 2022-12-14 NOTE — Progress Notes (Signed)
Physical Therapy Treatment Patient Details Name: Susan Turner MRN: [id number] DOB: 05/03/1945 Today's Date: 12/14/2022   History of Present Illness 77 y.o. female  presented to the ED for evaluation of diarrhea, fatigue, lightheadedness.  medical history significant for T2DM, HTN, HLD, iron deficiency anemia, osteoarthritis, R TKA, R TKA revision.    PT Comments   Pt admitted with above diagnosis.  Pt currently with functional limitations due to the deficits listed below (see PT Problem List). Pt in bed when PT arrived. Pt agreeable to therapy intervention. Pt stated  she is gong to [facility] for rehab and to get her legs stronger so she can walk again. Pt required S and increased time with use of hospital bed for supine to sit, pt able to scoot in sitting to EOB with S, CGA for sit to stand  from EOB and CGA with use of RW and cues for SPT to recliner. Pt left seated in recliner, all needs in place, pt declined CP for L knee pain reported 8/10. Patient will benefit from continued inpatient follow up therapy, <3 hours/day. Pt will benefit from acute skilled PT to increase their independence and safety with mobility to allow discharge.      If plan is discharge home, recommend the following: A lot of help with bathing/dressing/bathroom;Assistance with cooking/housework;Assist for transportation;Help with stairs or ramp for entrance;A lot of help with walking and/or transfers   Can travel by private vehicle     Yes  Equipment Recommendations  None recommended by PT    Recommendations for Other Services       Precautions / Restrictions Precautions Precautions: Fall Restrictions Weight Bearing Restrictions: No     Mobility  Bed Mobility Overal bed mobility: Needs Assistance Bed Mobility: Supine to Sit     Supine to sit: Used rails, Supervision, HOB elevated     General bed mobility comments: increased time and min cues    Transfers Overall transfer level: Needs  assistance Equipment used: Rolling walker (2 wheels) Transfers: Sit to/from Stand, Bed to chair/wheelchair/BSC Sit to Stand: Contact guard assist, From elevated surface Stand pivot transfers: Contact guard assist         General transfer comment: min cues for posture and safety. pt able to take 2 steps anteriorly and perform retrograde stepping pattern with RW to recliner min cues and no overt LOB. pt declined gait trial today stating she is going to rehab and will get her legs stronger there    Ambulation/Gait                   Stairs             Wheelchair Mobility     Tilt Bed    Modified Rankin (Stroke Patients Only)       Balance Overall balance assessment: Needs assistance Sitting-balance support: Feet supported Sitting balance-Leahy Scale: Good     Standing balance support: Bilateral upper extremity supported, During functional activity, Reliant on assistive device for balance Standing balance-Leahy Scale: Poor                              Cognition Arousal: Alert Behavior During Therapy: WFL for tasks assessed/performed, Flat affect Overall Cognitive Status: Within Functional Limits for tasks assessed  Exercises      General Comments        Pertinent Vitals/Pain Pain Assessment Pain Assessment: 0-10 Pain Score: 8  Pain Location: L knee Pain Descriptors / Indicators: Grimacing, Sore, Constant Pain Intervention(s): Limited activity within patient's tolerance, Monitored during session, Repositioned (pt declined CP)    Home Living                          Prior Function            PT Goals (current goals can now be found in the care plan section) Acute Rehab PT Goals Patient Stated Goal: be able to walk PT Goal Formulation: With patient Time For Goal Achievement: 12/19/22 Potential to Achieve Goals: Good Progress towards PT goals: Progressing toward  goals    Frequency    Min 1X/week      PT Plan      Co-evaluation              AM-PAC PT "6 Clicks" Mobility   Outcome Measure  Help needed turning from your back to your side while in a flat bed without using bedrails?: A Little Help needed moving from lying on your back to sitting on the side of a flat bed without using bedrails?: A Little Help needed moving to and from a bed to a chair (including a wheelchair)?: A Little Help needed standing up from a chair using your arms (e.g., wheelchair or bedside chair)?: A Little Help needed to walk in hospital room?: Total Help needed climbing 3-5 steps with a railing? : Total 6 Click Score: 14    End of Session Equipment Utilized During Treatment: Gait belt Activity Tolerance: Patient limited by fatigue;Patient limited by pain Patient left: with call bell/phone within reach;in chair Nurse Communication: Mobility status PT Visit Diagnosis: Other abnormalities of gait and mobility (R26.89);Difficulty in walking, not elsewhere classified (R26.2)     Time: 9811-9147 PT Time Calculation (min) (ACUTE ONLY): 18 min  Charges:    $Therapeutic Activity: 8-22 mins PT General Charges $$ ACUTE PT VISIT: 1 Visit                     [name], PT Acute Rehab    Jacqualyn Posey 12/14/2022, 12:03 PM

## 2022-12-14 NOTE — Discharge Summary (Signed)
Physician Discharge Summary  Susan Turner DGL:[id number] DOB: [birth year] DOA: 12/10/2022  PCP: Wilfrid Lund, PA  Admit date: 12/10/2022 Discharge date: 12/14/2022  Time spent: 36 minutes  Recommendations for Outpatient Follow-up:  Needs Chem-12 CBC in about 1 week Started on Amaryl this hospital admission 2 mg daily because of impaired glucose tolerance-May need to titrate in the outpatient depending on sugars Avoid tramadol Robaxin in the outpatient setting okay to use gabapentin Avoid ARB chlorthalidone because of volume depletion and check labs in a week Will need skilled facility placement at discharge  Discharge Diagnoses:  MAIN problem for hospitalization   Toxic metabolic encephalopathy on admission secondary to volume depletion and polypharmacy  Please see below for itemized issues addressed in HOpsital- refer to other progress notes for clarity if needed  Discharge Condition: Improved  Diet recommendation: Heart healthy diabetic  Filed Weights   12/12/22 0453 12/13/22 0703 12/14/22 0528  Weight: 98.8 kg 98.3 kg 96.6 kg    History of present illness:  41 uses wheelchair baseline occasional walker wf R tkr in the past with revision 04/05/2022,--- this was followed previously by Dr. 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 simple bilateral renal cysts not requiring further follow-up otherwise unremarkable no hydronephrosis Bladder scan pending, renal ultrasound pending 12/6 US renal duplicated left renal collecting system simple bilateral renal cysts otherwise unremarkable 12/8 nonambulatory and at a lower functional level than prior PT consulted need skilled  Hospital Course:  Metabolic encephalopathy on admission improved-combination of AKI/polypharmacy--now gabapentin at 300 twice daily--no more tramadol, Robaxin Reimplement as outpatient if felt absolutely necessary   Hypovolemic hyponatremia from history--- had diarrhea for several days prior to admission a home metabolic abnormalities AKI metabolic acidosis--?  Obstructive uropathy-FENa 1.2%-PTA , chlorthalidone 12.5, a VaPro 300---Stopped ARB and chlorthalidone this hospital stay and should not resume this in the outpatient setting given risk of recurrence of this Hypokalemia-K-Dur 40 twice daily was given and her potassium is 3.5 at discharge and will need labs in the outpatient setting Hyponatremia-resolved Acidosis- 2/2 diarrheal losses AKI resolved   Diarrheal illness Completely resolved--no further workup   PTA asymptomatic bacteriuria-only empirically treated Nitrites negative leukocytes large hemoglobin moderate--- was being treated empirically in the outpatient setting, no cultures were performed-we like to to hold antibiotics going forward and she did not experience any dysuria etc.   No pneumonia X-ray nonconcerning for pneumonia hold antibiotics   DM TY 2 HLD Sliding scale coverage at this, does not appear to be on any meds for this-will need to implement low-dose medication --- sugars were above 200 consistently Start Amaryl 2 mg in a.m. and graduate-needs education and glucometer in outpatient setting    HTN Better controlled back on metoprolol back to 100 twice daily, resume  amlodipine 5 twice daily See above discussion and avoid anything with effect on the kidneys  Prior PJI infection status post revision status post previous treatment with IV antibiotics Stable  She has been immobile unable to move around as well as she was seen get to her wheelchair from the bed over the past several days because with diarrheal illness and would benefit from skilled placement to improve back to prior level of function She is much more coherent and willing to work  Discharge Exam: Vitals:   12/14/22 0519 12/14/22 0833  BP: (!) 157/79 130/67  Pulse: 70 71  Resp: 17   Temp: 97.7 F (36.5 C)   SpO2: 97%     Subj on day of d/c   Coherent pleasant sitting up no distress No fever  General Exam on discharge  EOMI NCAT no focal deficit Power 5/5 ROM intact Abdomen soft no rebound Chest clear Psych euthymic coherent  Discharge Instructions   Discharge Instructions     Diet - low sodium heart healthy   Complete by: As directed    Increase activity slowly   Complete by: As directed       Allergies as of 12/14/2022       Reactions   Lisinopril Cough   Codeine Other (See Comments)   Tachycardia    Motrin [ibuprofen]    Pt does not remember reaction         Medication List     STOP taking these medications    chlorthalidone 25 MG tablet Commonly known as: HYGROTON   HYDROcodone-acetaminophen 5-325 MG tablet Commonly known as: NORCO/VICODIN   irbesartan 300 MG tablet Commonly known as: AVAPRO   methocarbamol 500 MG tablet Commonly known as: ROBAXIN   Potassium 99 MG Tabs   traMADol 50 MG tablet Commonly known as: ULTRAM       TAKE these medications    acetaminophen 325 MG tablet Commonly known as: TYLENOL Take 2 tablets (650 mg total) by mouth every 6 (six) hours as needed for mild pain (pain score 1-3) (or Fever >/= 101).   amLODipine 5 MG tablet Commonly known as: NORVASC Take 5 mg by mouth 2 (two) times daily.   ammonium  lactate 12 % lotion Commonly known as: LAC-HYDRIN Apply 1 Application topically as needed for dry skin.   calcium carbonate 500 MG chewable tablet Commonly known as: TUMS - dosed in mg elemental calcium Chew 2 tablets by mouth daily as needed for indigestion or heartburn.   ferrous sulfate dried 160 (50 Fe) MG Tbcr SR tablet Commonly known as: SLOW FE Take 160 mg by mouth once a week.   gabapentin 300 MG capsule Commonly known as: NEURONTIN Take 1 capsule (300 mg total) by mouth 2 (two) times daily. What changed: when to take this   glimepiride 2 MG tablet Commonly known as: AMARYL Take 1 tablet (2 mg total) by mouth daily with breakfast. Start taking on: December 15, 2022   metoprolol tartrate 100 MG tablet Commonly known as: LOPRESSOR Take 100 mg by mouth 2 (two) times daily.   polyethylene glycol 17 g packet Commonly known as: MIRALAX / GLYCOLAX Take 17 g by mouth 2 (two) times daily. What changed:  when to take this reasons to take this   rosuvastatin 5 MG tablet Commonly known as: CRESTOR Take 5 mg by mouth once a week. Sunday morning       Allergies  Allergen Reactions   Lisinopril Cough   Codeine Other (See Comments)    Tachycardia    Motrin [Ibuprofen]     Pt does not  remember reaction       The results of significant diagnostics from this hospitalization (including imaging, microbiology, ancillary and laboratory) are listed below for reference.    Significant Diagnostic Studies: US RENAL  Result Date: 12/10/2022 CLINICAL DATA:  Acute renal injury EXAM: RENAL / URINARY TRACT ULTRASOUND COMPLETE COMPARISON:  06/04/2015 FINDINGS: Right Kidney: Renal measurements: 11.9 x 5.6 x 5.3 cm = volume: 184.8 mL. Normal renal cortical echotexture. 5.2 x 3.5 x 4.7 cm simple appearing cyst lower pole right kidney. No evidence of hydronephrosis or nephrolithiasis. Left Kidney: Renal measurements: 11.4 x 5.1 x 6.0 cm = volume: 183.1 mL. Duplicated left renal collecting  system again noted. Normal renal cortical echotexture. 3.0 x 3.4 x 3.5 cm simple appearing cyst within the lower pole. No hydronephrosis or nephrolithiasis. Bladder: Appears normal for degree of bladder distention. Other: None. IMPRESSION: 1. Duplicated left renal collecting system. 2. Simple bilateral renal cysts do not require specific imaging follow-up. 3. Otherwise unremarkable renal ultrasound. Electronically Signed   By: Sharlet Salina M.D.   On: 12/10/2022 23:02   DG Chest Port 1 View  Result Date: 12/10/2022 CLINICAL DATA:  Sepsis, lethargy EXAM: PORTABLE CHEST 1 VIEW COMPARISON:  08/31/2018 FINDINGS: Faint patchy left midlung opacity, suggesting mild left upper lobe infection/pneumonia. Right lung is clear. No pleural effusion or pneumothorax. The heart is normal in size. IMPRESSION: Faint patchy left midlung opacity, suggesting mild left upper lobe infection/pneumonia. Electronically Signed   By: Charline Bills M.D.   On: 12/10/2022 17:57    Microbiology: Recent Results (from the past 240 hour(s))  Blood Culture (routine x 2)     Status: None (Preliminary result)   Collection Time: 12/10/22  5:10 PM   Specimen: BLOOD  Result Value Ref Range Status   Specimen Description   Final    BLOOD LEFT ANTECUBITAL Performed at [facility], [address], [place], [place] [address]    Special Requests   Final    BOTTLES DRAWN AEROBIC AND ANAEROBIC Blood Culture adequate volume Performed at [facility], [address] [place], [place] [address]    Culture   Final    NO GROWTH 4 DAYS Performed at [facility], [address] [place], [place] [address]    Report Status PENDING  Incomplete  Resp panel by RT-PCR (RSV, Flu A&B, Covid) Anterior Nasal Swab     Status: None   Collection Time: 12/10/22  8:31 PM   Specimen: Anterior Nasal Swab  Result Value Ref Range Status   SARS Coronavirus 2 by RT PCR NEGATIVE NEGATIVE Final    Comment:  (NOTE) SARS-CoV-2 target nucleic acids are NOT DETECTED.  The SARS-CoV-2 RNA is generally detectable in upper respiratory specimens during the acute phase of infection. The lowest concentration of SARS-CoV-2 viral copies this assay can detect is 138 copies/mL. A negative result does not preclude SARS-Cov-2 infection and should not be used as the sole basis for treatment or other patient management decisions. A negative result may occur with  improper specimen collection/handling, submission of specimen other than nasopharyngeal swab, presence of viral mutation(s) within the areas targeted by this assay, and inadequate number of viral copies(<138 copies/mL). A negative result must be combined with clinical observations, patient history, and epidemiological information. The expected result is Negative.  Fact Sheet for Patients:  BloggerCourse.com  Fact Sheet for Healthcare Providers:  SeriousBroker.it  This test is no t yet approved or cleared by the [place] and  has been authorized for detection and/or diagnosis of SARS-CoV-2 by FDA under an Emergency Use Authorization (EUA). This EUA will remain  in effect (meaning this test can be used) for the duration of the COVID-19 declaration under Section 564(b)(1) of the Act, 21 U.S.C.section 360bbb-3(b)(1), unless the authorization is terminated  or revoked sooner.       Influenza A by PCR NEGATIVE NEGATIVE Final   Influenza B by PCR NEGATIVE NEGATIVE Final    Comment: (NOTE) The Xpert Xpress SARS-CoV-2/FLU/RSV plus assay is intended as an aid in the diagnosis of influenza from Nasopharyngeal swab specimens and should not be used as a sole basis for treatment. Nasal washings and aspirates are unacceptable for Xpert Xpress SARS-CoV-2/FLU/RSV testing.  Fact Sheet for Patients: BloggerCourse.com  Fact Sheet for Healthcare  Providers: SeriousBroker.it  This test is not yet approved or cleared by the [place] FDA and has been authorized for detection and/or diagnosis of SARS-CoV-2 by FDA under an Emergency Use Authorization (EUA). This EUA will remain in effect (meaning this test can be used) for the duration of the COVID-19 declaration under Section 564(b)(1) of the Act, 21 U.S.C. section 360bbb-3(b)(1), unless the authorization is terminated or revoked.     Resp Syncytial Virus by PCR NEGATIVE NEGATIVE Final    Comment: (NOTE) Fact Sheet for Patients: BloggerCourse.com  Fact Sheet for Healthcare Providers: SeriousBroker.it  This test is not yet approved or cleared by the [place] FDA and has been authorized for detection and/or diagnosis of SARS-CoV-2 by FDA under an Emergency Use Authorization (EUA). This EUA will remain in effect (meaning this test can be used) for the duration of the COVID-19 declaration under Section 564(b)(1) of the Act, 21 U.S.C. section 360bbb-3(b)(1), unless the authorization is terminated or revoked.  Performed at [facility], [address] [place], [place] [address]   Blood Culture (routine x 2)     Status: None (Preliminary result)   Collection Time: 12/11/22  7:35 AM   Specimen: BLOOD  Result Value Ref Range Status   Specimen Description   Final    BLOOD BLOOD LEFT WRIST Performed at [facility], [address] [place], [place] [address]    Special Requests   Final    BOTTLES DRAWN AEROBIC AND ANAEROBIC Blood Culture adequate volume Performed at [facility], [address], [place], [place] [address]    Culture   Final    NO GROWTH 3 DAYS Performed at [facility], [address], [place], [place] [address]    Report Status PENDING  Incomplete     Labs: Basic Metabolic Panel: Recent Labs  Lab  12/10/22 1710 12/11/22 0735 12/12/22 0357 12/13/22 0423  NA 130* 136 137 137  K 2.3* 2.4* 3.1* 3.5  CL 94* 102 101 104  CO2 19* 17* 24 24  GLUCOSE 191* 163* 233* 216*  BUN 109* 97* 80* 47*  CREATININE 5.63* 3.33* 1.74* 0.97  CALCIUM 7.2* 6.9* 7.0* 7.6*  MG 2.4  --  2.0  --    Liver Function Tests: Recent Labs  Lab 12/10/22 1710  AST 16  ALT 18  ALKPHOS 92  BILITOT 0.9  PROT 6.4*  ALBUMIN 2.7*   No results for input(s): "LIPASE", "AMYLASE" in the last 168 hours. No results for input(s): "AMMONIA" in the last 168 hours. CBC: Recent Labs  Lab 12/10/22 1710 12/11/22 0735 12/12/22 0357  WBC 8.4 6.3 4.2  NEUTROABS 6.5  --  2.9  HGB 11.9* 11.2* 10.7*  HCT 34.4* 32.2* 31.3*  MCV 82.3 82.6 83.5  PLT 197 200 208   Cardiac Enzymes: No results for input(s): "CKTOTAL", "CKMB", "CKMBINDEX", "TROPONINI" in the last 168 hours. BNP: BNP (last 3 results) No results for input(s): "BNP" in the last 8760 hours.  ProBNP (last 3 results) No results for input(s): "PROBNP" in the last 8760 hours.  CBG: Recent Labs  Lab 12/13/22 0737 12/13/22 1135 12/13/22 1622 12/13/22 2036 12/14/22 0751  GLUCAP 208* 206* 171* 202* 177*       Signed:  Rhetta Mura MD   [facility] 12/14/2022, 9:32 AM

## 2022-12-14 NOTE — Plan of Care (Signed)
  Problem: Clinical Measurements: Goal: Respiratory complications will improve Outcome: Progressing Goal: Cardiovascular complication will be avoided Outcome: Progressing   Problem: Nutrition: Goal: Adequate nutrition will be maintained Outcome: Progressing   Problem: Coping: Goal: Level of anxiety will decrease Outcome: Progressing   Problem: Elimination: Goal: Will not experience complications related to urinary retention Outcome: Progressing   Problem: Safety: Goal: Ability to remain free from injury will improve Outcome: Progressing

## 2022-12-15 DIAGNOSIS — M6281 Muscle weakness (generalized): Secondary | ICD-10-CM | POA: Diagnosis not present

## 2022-12-15 DIAGNOSIS — R2681 Unsteadiness on feet: Secondary | ICD-10-CM | POA: Diagnosis not present

## 2022-12-15 DIAGNOSIS — E669 Obesity, unspecified: Secondary | ICD-10-CM | POA: Diagnosis not present

## 2022-12-15 DIAGNOSIS — M25562 Pain in left knee: Secondary | ICD-10-CM | POA: Diagnosis not present

## 2022-12-15 LAB — CULTURE, BLOOD (ROUTINE X 2)
Culture: NO GROWTH
Special Requests: ADEQUATE

## 2022-12-16 DIAGNOSIS — E1165 Type 2 diabetes mellitus with hyperglycemia: Secondary | ICD-10-CM | POA: Diagnosis not present

## 2022-12-16 DIAGNOSIS — E785 Hyperlipidemia, unspecified: Secondary | ICD-10-CM | POA: Diagnosis not present

## 2022-12-16 DIAGNOSIS — M1990 Unspecified osteoarthritis, unspecified site: Secondary | ICD-10-CM | POA: Diagnosis not present

## 2022-12-16 DIAGNOSIS — Z96651 Presence of right artificial knee joint: Secondary | ICD-10-CM | POA: Diagnosis not present

## 2022-12-16 DIAGNOSIS — E871 Hypo-osmolality and hyponatremia: Secondary | ICD-10-CM | POA: Diagnosis not present

## 2022-12-16 DIAGNOSIS — N179 Acute kidney failure, unspecified: Secondary | ICD-10-CM | POA: Diagnosis not present

## 2022-12-16 DIAGNOSIS — I152 Hypertension secondary to endocrine disorders: Secondary | ICD-10-CM | POA: Diagnosis not present

## 2022-12-16 DIAGNOSIS — G9341 Metabolic encephalopathy: Secondary | ICD-10-CM | POA: Diagnosis not present

## 2022-12-16 DIAGNOSIS — E876 Hypokalemia: Secondary | ICD-10-CM | POA: Diagnosis not present

## 2022-12-16 LAB — CULTURE, BLOOD (ROUTINE X 2)
Culture: NO GROWTH
Special Requests: ADEQUATE

## 2022-12-20 DIAGNOSIS — E669 Obesity, unspecified: Secondary | ICD-10-CM | POA: Diagnosis not present

## 2022-12-20 DIAGNOSIS — M25562 Pain in left knee: Secondary | ICD-10-CM | POA: Diagnosis not present

## 2022-12-20 DIAGNOSIS — R2681 Unsteadiness on feet: Secondary | ICD-10-CM | POA: Diagnosis not present

## 2022-12-20 DIAGNOSIS — M6281 Muscle weakness (generalized): Secondary | ICD-10-CM | POA: Diagnosis not present

## 2022-12-22 DIAGNOSIS — R2681 Unsteadiness on feet: Secondary | ICD-10-CM | POA: Diagnosis not present

## 2022-12-22 DIAGNOSIS — M6281 Muscle weakness (generalized): Secondary | ICD-10-CM | POA: Diagnosis not present

## 2022-12-22 DIAGNOSIS — M25562 Pain in left knee: Secondary | ICD-10-CM | POA: Diagnosis not present

## 2022-12-22 DIAGNOSIS — R600 Localized edema: Secondary | ICD-10-CM | POA: Diagnosis not present

## 2022-12-22 DIAGNOSIS — E669 Obesity, unspecified: Secondary | ICD-10-CM | POA: Diagnosis not present

## 2022-12-24 DIAGNOSIS — G8929 Other chronic pain: Secondary | ICD-10-CM | POA: Diagnosis not present

## 2022-12-24 DIAGNOSIS — Z8701 Personal history of pneumonia (recurrent): Secondary | ICD-10-CM | POA: Diagnosis not present

## 2022-12-24 DIAGNOSIS — D649 Anemia, unspecified: Secondary | ICD-10-CM | POA: Diagnosis not present

## 2022-12-24 DIAGNOSIS — E1165 Type 2 diabetes mellitus with hyperglycemia: Secondary | ICD-10-CM | POA: Diagnosis not present

## 2022-12-24 DIAGNOSIS — Z87448 Personal history of other diseases of urinary system: Secondary | ICD-10-CM | POA: Diagnosis not present

## 2022-12-24 DIAGNOSIS — Z7984 Long term (current) use of oral hypoglycemic drugs: Secondary | ICD-10-CM | POA: Diagnosis not present

## 2022-12-24 DIAGNOSIS — M25562 Pain in left knee: Secondary | ICD-10-CM | POA: Diagnosis not present

## 2022-12-24 DIAGNOSIS — G9341 Metabolic encephalopathy: Secondary | ICD-10-CM | POA: Diagnosis not present

## 2022-12-24 DIAGNOSIS — I152 Hypertension secondary to endocrine disorders: Secondary | ICD-10-CM | POA: Diagnosis not present

## 2023-01-03 DIAGNOSIS — G928 Other toxic encephalopathy: Secondary | ICD-10-CM | POA: Diagnosis not present

## 2023-01-03 DIAGNOSIS — I152 Hypertension secondary to endocrine disorders: Secondary | ICD-10-CM | POA: Diagnosis not present

## 2023-01-03 DIAGNOSIS — E1165 Type 2 diabetes mellitus with hyperglycemia: Secondary | ICD-10-CM | POA: Diagnosis not present

## 2023-01-03 DIAGNOSIS — N179 Acute kidney failure, unspecified: Secondary | ICD-10-CM | POA: Diagnosis not present

## 2023-01-03 DIAGNOSIS — M1712 Unilateral primary osteoarthritis, left knee: Secondary | ICD-10-CM | POA: Diagnosis not present

## 2023-01-03 DIAGNOSIS — E869 Volume depletion, unspecified: Secondary | ICD-10-CM | POA: Diagnosis not present

## 2023-01-03 DIAGNOSIS — Z89619 Acquired absence of unspecified leg above knee: Secondary | ICD-10-CM | POA: Diagnosis not present

## 2023-01-03 DIAGNOSIS — N390 Urinary tract infection, site not specified: Secondary | ICD-10-CM | POA: Diagnosis not present

## 2023-01-03 DIAGNOSIS — E1159 Type 2 diabetes mellitus with other circulatory complications: Secondary | ICD-10-CM | POA: Diagnosis not present

## 2023-01-11 DIAGNOSIS — N179 Acute kidney failure, unspecified: Secondary | ICD-10-CM | POA: Diagnosis not present

## 2023-01-11 DIAGNOSIS — E1159 Type 2 diabetes mellitus with other circulatory complications: Secondary | ICD-10-CM | POA: Diagnosis not present

## 2023-01-11 DIAGNOSIS — M1712 Unilateral primary osteoarthritis, left knee: Secondary | ICD-10-CM | POA: Diagnosis not present

## 2023-01-11 DIAGNOSIS — N390 Urinary tract infection, site not specified: Secondary | ICD-10-CM | POA: Diagnosis not present

## 2023-01-11 DIAGNOSIS — G928 Other toxic encephalopathy: Secondary | ICD-10-CM | POA: Diagnosis not present

## 2023-01-11 DIAGNOSIS — I152 Hypertension secondary to endocrine disorders: Secondary | ICD-10-CM | POA: Diagnosis not present

## 2023-01-11 DIAGNOSIS — E869 Volume depletion, unspecified: Secondary | ICD-10-CM | POA: Diagnosis not present

## 2023-01-11 DIAGNOSIS — Z89619 Acquired absence of unspecified leg above knee: Secondary | ICD-10-CM | POA: Diagnosis not present

## 2023-01-11 DIAGNOSIS — E1165 Type 2 diabetes mellitus with hyperglycemia: Secondary | ICD-10-CM | POA: Diagnosis not present

## 2023-01-12 DIAGNOSIS — N390 Urinary tract infection, site not specified: Secondary | ICD-10-CM | POA: Diagnosis not present

## 2023-01-12 DIAGNOSIS — E1159 Type 2 diabetes mellitus with other circulatory complications: Secondary | ICD-10-CM | POA: Diagnosis not present

## 2023-01-12 DIAGNOSIS — G928 Other toxic encephalopathy: Secondary | ICD-10-CM | POA: Diagnosis not present

## 2023-01-12 DIAGNOSIS — N179 Acute kidney failure, unspecified: Secondary | ICD-10-CM | POA: Diagnosis not present

## 2023-01-12 DIAGNOSIS — I152 Hypertension secondary to endocrine disorders: Secondary | ICD-10-CM | POA: Diagnosis not present

## 2023-01-12 DIAGNOSIS — E869 Volume depletion, unspecified: Secondary | ICD-10-CM | POA: Diagnosis not present

## 2023-01-12 DIAGNOSIS — M1712 Unilateral primary osteoarthritis, left knee: Secondary | ICD-10-CM | POA: Diagnosis not present

## 2023-01-12 DIAGNOSIS — E1165 Type 2 diabetes mellitus with hyperglycemia: Secondary | ICD-10-CM | POA: Diagnosis not present

## 2023-01-12 DIAGNOSIS — Z89619 Acquired absence of unspecified leg above knee: Secondary | ICD-10-CM | POA: Diagnosis not present

## 2023-01-13 DIAGNOSIS — G928 Other toxic encephalopathy: Secondary | ICD-10-CM | POA: Diagnosis not present

## 2023-01-13 DIAGNOSIS — Z89619 Acquired absence of unspecified leg above knee: Secondary | ICD-10-CM | POA: Diagnosis not present

## 2023-01-13 DIAGNOSIS — N179 Acute kidney failure, unspecified: Secondary | ICD-10-CM | POA: Diagnosis not present

## 2023-01-13 DIAGNOSIS — E869 Volume depletion, unspecified: Secondary | ICD-10-CM | POA: Diagnosis not present

## 2023-01-13 DIAGNOSIS — E1165 Type 2 diabetes mellitus with hyperglycemia: Secondary | ICD-10-CM | POA: Diagnosis not present

## 2023-01-13 DIAGNOSIS — I152 Hypertension secondary to endocrine disorders: Secondary | ICD-10-CM | POA: Diagnosis not present

## 2023-01-13 DIAGNOSIS — M1712 Unilateral primary osteoarthritis, left knee: Secondary | ICD-10-CM | POA: Diagnosis not present

## 2023-01-13 DIAGNOSIS — E1159 Type 2 diabetes mellitus with other circulatory complications: Secondary | ICD-10-CM | POA: Diagnosis not present

## 2023-01-13 DIAGNOSIS — N390 Urinary tract infection, site not specified: Secondary | ICD-10-CM | POA: Diagnosis not present

## 2023-01-18 DIAGNOSIS — G928 Other toxic encephalopathy: Secondary | ICD-10-CM | POA: Diagnosis not present

## 2023-01-18 DIAGNOSIS — N390 Urinary tract infection, site not specified: Secondary | ICD-10-CM | POA: Diagnosis not present

## 2023-01-18 DIAGNOSIS — M1712 Unilateral primary osteoarthritis, left knee: Secondary | ICD-10-CM | POA: Diagnosis not present

## 2023-01-18 DIAGNOSIS — N179 Acute kidney failure, unspecified: Secondary | ICD-10-CM | POA: Diagnosis not present

## 2023-01-18 DIAGNOSIS — E1165 Type 2 diabetes mellitus with hyperglycemia: Secondary | ICD-10-CM | POA: Diagnosis not present

## 2023-01-18 DIAGNOSIS — I152 Hypertension secondary to endocrine disorders: Secondary | ICD-10-CM | POA: Diagnosis not present

## 2023-01-18 DIAGNOSIS — E869 Volume depletion, unspecified: Secondary | ICD-10-CM | POA: Diagnosis not present

## 2023-01-18 DIAGNOSIS — E1159 Type 2 diabetes mellitus with other circulatory complications: Secondary | ICD-10-CM | POA: Diagnosis not present

## 2023-01-18 DIAGNOSIS — Z89619 Acquired absence of unspecified leg above knee: Secondary | ICD-10-CM | POA: Diagnosis not present

## 2023-01-19 DIAGNOSIS — E1169 Type 2 diabetes mellitus with other specified complication: Secondary | ICD-10-CM | POA: Diagnosis not present

## 2023-01-21 DIAGNOSIS — G928 Other toxic encephalopathy: Secondary | ICD-10-CM | POA: Diagnosis not present

## 2023-01-21 DIAGNOSIS — Z89619 Acquired absence of unspecified leg above knee: Secondary | ICD-10-CM | POA: Diagnosis not present

## 2023-01-21 DIAGNOSIS — E869 Volume depletion, unspecified: Secondary | ICD-10-CM | POA: Diagnosis not present

## 2023-01-21 DIAGNOSIS — E1159 Type 2 diabetes mellitus with other circulatory complications: Secondary | ICD-10-CM | POA: Diagnosis not present

## 2023-01-21 DIAGNOSIS — I152 Hypertension secondary to endocrine disorders: Secondary | ICD-10-CM | POA: Diagnosis not present

## 2023-01-21 DIAGNOSIS — E1165 Type 2 diabetes mellitus with hyperglycemia: Secondary | ICD-10-CM | POA: Diagnosis not present

## 2023-01-21 DIAGNOSIS — N179 Acute kidney failure, unspecified: Secondary | ICD-10-CM | POA: Diagnosis not present

## 2023-01-21 DIAGNOSIS — M1712 Unilateral primary osteoarthritis, left knee: Secondary | ICD-10-CM | POA: Diagnosis not present

## 2023-01-21 DIAGNOSIS — N390 Urinary tract infection, site not specified: Secondary | ICD-10-CM | POA: Diagnosis not present

## 2023-01-25 DIAGNOSIS — Z89619 Acquired absence of unspecified leg above knee: Secondary | ICD-10-CM | POA: Diagnosis not present

## 2023-01-25 DIAGNOSIS — E1159 Type 2 diabetes mellitus with other circulatory complications: Secondary | ICD-10-CM | POA: Diagnosis not present

## 2023-01-25 DIAGNOSIS — N390 Urinary tract infection, site not specified: Secondary | ICD-10-CM | POA: Diagnosis not present

## 2023-01-25 DIAGNOSIS — M1712 Unilateral primary osteoarthritis, left knee: Secondary | ICD-10-CM | POA: Diagnosis not present

## 2023-01-25 DIAGNOSIS — N179 Acute kidney failure, unspecified: Secondary | ICD-10-CM | POA: Diagnosis not present

## 2023-01-25 DIAGNOSIS — E1165 Type 2 diabetes mellitus with hyperglycemia: Secondary | ICD-10-CM | POA: Diagnosis not present

## 2023-01-25 DIAGNOSIS — E869 Volume depletion, unspecified: Secondary | ICD-10-CM | POA: Diagnosis not present

## 2023-01-25 DIAGNOSIS — G928 Other toxic encephalopathy: Secondary | ICD-10-CM | POA: Diagnosis not present

## 2023-01-25 DIAGNOSIS — I152 Hypertension secondary to endocrine disorders: Secondary | ICD-10-CM | POA: Diagnosis not present

## 2023-01-27 DIAGNOSIS — E1159 Type 2 diabetes mellitus with other circulatory complications: Secondary | ICD-10-CM | POA: Diagnosis not present

## 2023-01-27 DIAGNOSIS — N390 Urinary tract infection, site not specified: Secondary | ICD-10-CM | POA: Diagnosis not present

## 2023-01-27 DIAGNOSIS — G928 Other toxic encephalopathy: Secondary | ICD-10-CM | POA: Diagnosis not present

## 2023-01-27 DIAGNOSIS — N179 Acute kidney failure, unspecified: Secondary | ICD-10-CM | POA: Diagnosis not present

## 2023-01-27 DIAGNOSIS — Z89619 Acquired absence of unspecified leg above knee: Secondary | ICD-10-CM | POA: Diagnosis not present

## 2023-01-27 DIAGNOSIS — M1712 Unilateral primary osteoarthritis, left knee: Secondary | ICD-10-CM | POA: Diagnosis not present

## 2023-01-27 DIAGNOSIS — I152 Hypertension secondary to endocrine disorders: Secondary | ICD-10-CM | POA: Diagnosis not present

## 2023-01-27 DIAGNOSIS — E869 Volume depletion, unspecified: Secondary | ICD-10-CM | POA: Diagnosis not present

## 2023-01-27 DIAGNOSIS — E1165 Type 2 diabetes mellitus with hyperglycemia: Secondary | ICD-10-CM | POA: Diagnosis not present

## 2023-01-31 DIAGNOSIS — G928 Other toxic encephalopathy: Secondary | ICD-10-CM | POA: Diagnosis not present

## 2023-01-31 DIAGNOSIS — E1159 Type 2 diabetes mellitus with other circulatory complications: Secondary | ICD-10-CM | POA: Diagnosis not present

## 2023-01-31 DIAGNOSIS — Z89619 Acquired absence of unspecified leg above knee: Secondary | ICD-10-CM | POA: Diagnosis not present

## 2023-01-31 DIAGNOSIS — E869 Volume depletion, unspecified: Secondary | ICD-10-CM | POA: Diagnosis not present

## 2023-01-31 DIAGNOSIS — I152 Hypertension secondary to endocrine disorders: Secondary | ICD-10-CM | POA: Diagnosis not present

## 2023-01-31 DIAGNOSIS — N179 Acute kidney failure, unspecified: Secondary | ICD-10-CM | POA: Diagnosis not present

## 2023-01-31 DIAGNOSIS — M1712 Unilateral primary osteoarthritis, left knee: Secondary | ICD-10-CM | POA: Diagnosis not present

## 2023-01-31 DIAGNOSIS — E1165 Type 2 diabetes mellitus with hyperglycemia: Secondary | ICD-10-CM | POA: Diagnosis not present

## 2023-01-31 DIAGNOSIS — N390 Urinary tract infection, site not specified: Secondary | ICD-10-CM | POA: Diagnosis not present

## 2023-02-02 DIAGNOSIS — E1159 Type 2 diabetes mellitus with other circulatory complications: Secondary | ICD-10-CM | POA: Diagnosis not present

## 2023-02-02 DIAGNOSIS — M1712 Unilateral primary osteoarthritis, left knee: Secondary | ICD-10-CM | POA: Diagnosis not present

## 2023-02-02 DIAGNOSIS — G928 Other toxic encephalopathy: Secondary | ICD-10-CM | POA: Diagnosis not present

## 2023-02-02 DIAGNOSIS — I152 Hypertension secondary to endocrine disorders: Secondary | ICD-10-CM | POA: Diagnosis not present

## 2023-02-02 DIAGNOSIS — E869 Volume depletion, unspecified: Secondary | ICD-10-CM | POA: Diagnosis not present

## 2023-02-02 DIAGNOSIS — N179 Acute kidney failure, unspecified: Secondary | ICD-10-CM | POA: Diagnosis not present

## 2023-02-02 DIAGNOSIS — E1165 Type 2 diabetes mellitus with hyperglycemia: Secondary | ICD-10-CM | POA: Diagnosis not present

## 2023-02-02 DIAGNOSIS — Z89619 Acquired absence of unspecified leg above knee: Secondary | ICD-10-CM | POA: Diagnosis not present

## 2023-02-02 DIAGNOSIS — N390 Urinary tract infection, site not specified: Secondary | ICD-10-CM | POA: Diagnosis not present

## 2023-02-03 DIAGNOSIS — M1712 Unilateral primary osteoarthritis, left knee: Secondary | ICD-10-CM | POA: Diagnosis not present

## 2023-02-03 DIAGNOSIS — E869 Volume depletion, unspecified: Secondary | ICD-10-CM | POA: Diagnosis not present

## 2023-02-03 DIAGNOSIS — N390 Urinary tract infection, site not specified: Secondary | ICD-10-CM | POA: Diagnosis not present

## 2023-02-03 DIAGNOSIS — I10 Essential (primary) hypertension: Secondary | ICD-10-CM | POA: Diagnosis not present

## 2023-02-03 DIAGNOSIS — I152 Hypertension secondary to endocrine disorders: Secondary | ICD-10-CM | POA: Diagnosis not present

## 2023-02-03 DIAGNOSIS — N179 Acute kidney failure, unspecified: Secondary | ICD-10-CM | POA: Diagnosis not present

## 2023-02-03 DIAGNOSIS — E1165 Type 2 diabetes mellitus with hyperglycemia: Secondary | ICD-10-CM | POA: Diagnosis not present

## 2023-02-03 DIAGNOSIS — Z89619 Acquired absence of unspecified leg above knee: Secondary | ICD-10-CM | POA: Diagnosis not present

## 2023-02-03 DIAGNOSIS — E1159 Type 2 diabetes mellitus with other circulatory complications: Secondary | ICD-10-CM | POA: Diagnosis not present

## 2023-02-03 DIAGNOSIS — G928 Other toxic encephalopathy: Secondary | ICD-10-CM | POA: Diagnosis not present

## 2023-02-08 DIAGNOSIS — Z89619 Acquired absence of unspecified leg above knee: Secondary | ICD-10-CM | POA: Diagnosis not present

## 2023-02-08 DIAGNOSIS — E1165 Type 2 diabetes mellitus with hyperglycemia: Secondary | ICD-10-CM | POA: Diagnosis not present

## 2023-02-08 DIAGNOSIS — E869 Volume depletion, unspecified: Secondary | ICD-10-CM | POA: Diagnosis not present

## 2023-02-08 DIAGNOSIS — N179 Acute kidney failure, unspecified: Secondary | ICD-10-CM | POA: Diagnosis not present

## 2023-02-08 DIAGNOSIS — I152 Hypertension secondary to endocrine disorders: Secondary | ICD-10-CM | POA: Diagnosis not present

## 2023-02-08 DIAGNOSIS — G928 Other toxic encephalopathy: Secondary | ICD-10-CM | POA: Diagnosis not present

## 2023-02-08 DIAGNOSIS — E1159 Type 2 diabetes mellitus with other circulatory complications: Secondary | ICD-10-CM | POA: Diagnosis not present

## 2023-02-08 DIAGNOSIS — N390 Urinary tract infection, site not specified: Secondary | ICD-10-CM | POA: Diagnosis not present

## 2023-02-08 DIAGNOSIS — M1712 Unilateral primary osteoarthritis, left knee: Secondary | ICD-10-CM | POA: Diagnosis not present

## 2023-02-14 DIAGNOSIS — E869 Volume depletion, unspecified: Secondary | ICD-10-CM | POA: Diagnosis not present

## 2023-02-14 DIAGNOSIS — I152 Hypertension secondary to endocrine disorders: Secondary | ICD-10-CM | POA: Diagnosis not present

## 2023-02-14 DIAGNOSIS — G928 Other toxic encephalopathy: Secondary | ICD-10-CM | POA: Diagnosis not present

## 2023-02-14 DIAGNOSIS — Z89619 Acquired absence of unspecified leg above knee: Secondary | ICD-10-CM | POA: Diagnosis not present

## 2023-02-14 DIAGNOSIS — N390 Urinary tract infection, site not specified: Secondary | ICD-10-CM | POA: Diagnosis not present

## 2023-02-14 DIAGNOSIS — M1712 Unilateral primary osteoarthritis, left knee: Secondary | ICD-10-CM | POA: Diagnosis not present

## 2023-02-14 DIAGNOSIS — E1159 Type 2 diabetes mellitus with other circulatory complications: Secondary | ICD-10-CM | POA: Diagnosis not present

## 2023-02-14 DIAGNOSIS — N179 Acute kidney failure, unspecified: Secondary | ICD-10-CM | POA: Diagnosis not present

## 2023-02-14 DIAGNOSIS — E1165 Type 2 diabetes mellitus with hyperglycemia: Secondary | ICD-10-CM | POA: Diagnosis not present

## 2023-02-22 DIAGNOSIS — E1159 Type 2 diabetes mellitus with other circulatory complications: Secondary | ICD-10-CM | POA: Diagnosis not present

## 2023-02-22 DIAGNOSIS — E869 Volume depletion, unspecified: Secondary | ICD-10-CM | POA: Diagnosis not present

## 2023-02-22 DIAGNOSIS — M1712 Unilateral primary osteoarthritis, left knee: Secondary | ICD-10-CM | POA: Diagnosis not present

## 2023-02-22 DIAGNOSIS — E1165 Type 2 diabetes mellitus with hyperglycemia: Secondary | ICD-10-CM | POA: Diagnosis not present

## 2023-02-22 DIAGNOSIS — G928 Other toxic encephalopathy: Secondary | ICD-10-CM | POA: Diagnosis not present

## 2023-02-22 DIAGNOSIS — Z89619 Acquired absence of unspecified leg above knee: Secondary | ICD-10-CM | POA: Diagnosis not present

## 2023-02-22 DIAGNOSIS — I152 Hypertension secondary to endocrine disorders: Secondary | ICD-10-CM | POA: Diagnosis not present

## 2023-02-22 DIAGNOSIS — N179 Acute kidney failure, unspecified: Secondary | ICD-10-CM | POA: Diagnosis not present

## 2023-02-22 DIAGNOSIS — N390 Urinary tract infection, site not specified: Secondary | ICD-10-CM | POA: Diagnosis not present

## 2023-02-28 DIAGNOSIS — N390 Urinary tract infection, site not specified: Secondary | ICD-10-CM | POA: Diagnosis not present

## 2023-02-28 DIAGNOSIS — Z89619 Acquired absence of unspecified leg above knee: Secondary | ICD-10-CM | POA: Diagnosis not present

## 2023-02-28 DIAGNOSIS — I152 Hypertension secondary to endocrine disorders: Secondary | ICD-10-CM | POA: Diagnosis not present

## 2023-02-28 DIAGNOSIS — E1159 Type 2 diabetes mellitus with other circulatory complications: Secondary | ICD-10-CM | POA: Diagnosis not present

## 2023-02-28 DIAGNOSIS — E869 Volume depletion, unspecified: Secondary | ICD-10-CM | POA: Diagnosis not present

## 2023-02-28 DIAGNOSIS — G928 Other toxic encephalopathy: Secondary | ICD-10-CM | POA: Diagnosis not present

## 2023-02-28 DIAGNOSIS — M1712 Unilateral primary osteoarthritis, left knee: Secondary | ICD-10-CM | POA: Diagnosis not present

## 2023-02-28 DIAGNOSIS — N179 Acute kidney failure, unspecified: Secondary | ICD-10-CM | POA: Diagnosis not present

## 2023-02-28 DIAGNOSIS — E1165 Type 2 diabetes mellitus with hyperglycemia: Secondary | ICD-10-CM | POA: Diagnosis not present

## 2023-03-21 NOTE — Progress Notes (Unsigned)
  Cardiology Office Note:  .   Date:  03/22/2023  ID:  Susan Turner, DOB 1945/04/08, MRN [id number] PCP: Wilfrid Lund, PA  [facility] Providers Cardiologist:  None    History of Present Illness: .   Susan Turner is a 78 y.o. female with hx of HTN She has a hx of volume depletion associated with associated encephalopathy  Seen with a friend, Dorene Sorrow.   We were asked to see her for further evaluation of her HTN  She had chlorthalidone 12.5 mg a day added in Oct. 2024  Her BP has been normal since that time   She is in wheelchair  Spends most of her time in the wheelchair ,  even at home  Can walk short distances   She has been hospitalized for dehydration and electrolyte disturbances   She has some leg swelling - likely due to being in a dependent position We discussed compression hose and leg elevation       ROS:   Studies Reviewed: .         Risk Assessment/Calculations:             Physical Exam:   VS:  BP 134/62   Pulse 64   Ht 5\' 5"  (1.651 m)   Wt 205 lb (93 kg)   SpO2 94%   BMI 34.11 kg/m    Wt Readings from Last 3 Encounters:  03/22/23 205 lb (93 kg)  12/14/22 212 lb 15.4 oz (96.6 kg)  04/02/22 201 lb (91.2 kg)    GEN: elderly female, in wheelchair in no acute distress NECK: No JVD; No carotid bruits CARDIAC: RRR, no murmurs, rubs, gallops RESPIRATORY:  Clear to auscultation without rales, wheezing or rhonchi  ABDOMEN: Soft, non-tender, non-distended EXTREMITIES:  No edema; No deformity   ASSESSMENT AND PLAN: .      HTN:   BP is well controlled at this time .   I would recommend that she continue with her current medications.  She may see [place] on an as-needed basis.  2.  Leg swelling: She spends most of her day in a wheelchair.  I told her that this is why her legs are edematous.  I recommended that she use compression hose and leg elevation on a routine basis.  She has a history of volume depletion associated with profound  electrolyte disturbances and encephalopathy.  I would not want to start her on any stronger diuretic.  Will defer further medication management to her primary medical doctor.  She may see [place] on an as-needed basis.       Dispo: PRN    Signed, Kristeen Miss, MD

## 2023-03-22 ENCOUNTER — Ambulatory Visit: Payer: Medicare HMO | Attending: Cardiovascular Disease | Admitting: Cardiovascular Disease

## 2023-03-22 ENCOUNTER — Encounter: Payer: Self-pay | Admitting: Cardiovascular Disease

## 2023-03-22 VITALS — BP 134/62 | HR 64 | Ht 65.0 in | Wt 205.0 lb

## 2023-03-22 DIAGNOSIS — I10 Essential (primary) hypertension: Secondary | ICD-10-CM

## 2023-03-22 NOTE — Patient Instructions (Signed)
 Medication Instructions:  Your physician recommends that you continue on your current medications as directed. Please refer to the Current Medication list given to you today.  *If you need a refill on your cardiac medications before your next appointment, please call your pharmacy*  Follow-Up: At [facility], you and your health needs are our priority.  As part of our continuing mission to provide you with exceptional heart care, we have created designated Provider Care Teams.  These Care Teams include your primary Cardiologist (physician) and Advanced Practice Providers (APPs -  Physician Assistants and Nurse Practitioners) who all work together to provide you with the care you need, when you need it.  Your next appointment:   As needed  The format for your next appointment:   In Person  Provider:   Kristeen Miss, MD {  Other Instructions   1st Floor: - Lobby - Registration  - Pharmacy  - Lab - Cafe  2nd Floor: - PV Lab - Diagnostic Testing (echo, CT, nuclear med)  3rd Floor: - Vacant  4th Floor: - TCTS (cardiothoracic surgery) - AFib Clinic - Structural Heart Clinic - Vascular Surgery  - Vascular Ultrasound  5th Floor: - HeartCare Cardiology (general and EP) - Clinical Pharmacy for coumadin, hypertension, lipid, weight-loss medications, and med management appointments    Valet parking services will be available as well.

## 2023-04-01 DIAGNOSIS — Z96651 Presence of right artificial knee joint: Secondary | ICD-10-CM | POA: Diagnosis not present

## 2023-04-01 DIAGNOSIS — M1712 Unilateral primary osteoarthritis, left knee: Secondary | ICD-10-CM | POA: Diagnosis not present

## 2023-04-06 DIAGNOSIS — E785 Hyperlipidemia, unspecified: Secondary | ICD-10-CM | POA: Diagnosis not present

## 2023-04-06 DIAGNOSIS — E669 Obesity, unspecified: Secondary | ICD-10-CM | POA: Diagnosis not present

## 2023-04-06 DIAGNOSIS — Z809 Family history of malignant neoplasm, unspecified: Secondary | ICD-10-CM | POA: Diagnosis not present

## 2023-04-06 DIAGNOSIS — R2681 Unsteadiness on feet: Secondary | ICD-10-CM | POA: Diagnosis not present

## 2023-04-06 DIAGNOSIS — Z7982 Long term (current) use of aspirin: Secondary | ICD-10-CM | POA: Diagnosis not present

## 2023-04-06 DIAGNOSIS — M1990 Unspecified osteoarthritis, unspecified site: Secondary | ICD-10-CM | POA: Diagnosis not present

## 2023-04-06 DIAGNOSIS — I10 Essential (primary) hypertension: Secondary | ICD-10-CM | POA: Diagnosis not present

## 2023-04-06 DIAGNOSIS — R32 Unspecified urinary incontinence: Secondary | ICD-10-CM | POA: Diagnosis not present

## 2023-04-06 DIAGNOSIS — R739 Hyperglycemia, unspecified: Secondary | ICD-10-CM | POA: Diagnosis not present

## 2023-04-06 DIAGNOSIS — Z8249 Family history of ischemic heart disease and other diseases of the circulatory system: Secondary | ICD-10-CM | POA: Diagnosis not present

## 2023-06-06 DIAGNOSIS — E1169 Type 2 diabetes mellitus with other specified complication: Secondary | ICD-10-CM | POA: Diagnosis not present

## 2023-06-06 DIAGNOSIS — E1165 Type 2 diabetes mellitus with hyperglycemia: Secondary | ICD-10-CM | POA: Diagnosis not present

## 2023-06-06 DIAGNOSIS — I10 Essential (primary) hypertension: Secondary | ICD-10-CM | POA: Diagnosis not present

## 2023-06-06 DIAGNOSIS — E1159 Type 2 diabetes mellitus with other circulatory complications: Secondary | ICD-10-CM | POA: Diagnosis not present

## 2023-07-04 DIAGNOSIS — E1165 Type 2 diabetes mellitus with hyperglycemia: Secondary | ICD-10-CM | POA: Diagnosis not present

## 2023-07-04 DIAGNOSIS — I10 Essential (primary) hypertension: Secondary | ICD-10-CM | POA: Diagnosis not present

## 2023-07-04 DIAGNOSIS — E113292 Type 2 diabetes mellitus with mild nonproliferative diabetic retinopathy without macular edema, left eye: Secondary | ICD-10-CM | POA: Diagnosis not present

## 2023-07-04 DIAGNOSIS — E1159 Type 2 diabetes mellitus with other circulatory complications: Secondary | ICD-10-CM | POA: Diagnosis not present

## 2023-07-04 DIAGNOSIS — E1169 Type 2 diabetes mellitus with other specified complication: Secondary | ICD-10-CM | POA: Diagnosis not present

## 2023-07-04 DIAGNOSIS — M1712 Unilateral primary osteoarthritis, left knee: Secondary | ICD-10-CM | POA: Diagnosis not present

## 2023-07-04 DIAGNOSIS — E1136 Type 2 diabetes mellitus with diabetic cataract: Secondary | ICD-10-CM | POA: Diagnosis not present

## 2023-07-05 DIAGNOSIS — I10 Essential (primary) hypertension: Secondary | ICD-10-CM | POA: Diagnosis not present

## 2023-07-05 DIAGNOSIS — E1169 Type 2 diabetes mellitus with other specified complication: Secondary | ICD-10-CM | POA: Diagnosis not present

## 2023-07-05 DIAGNOSIS — Z9181 History of falling: Secondary | ICD-10-CM | POA: Diagnosis not present

## 2023-08-04 DIAGNOSIS — M1712 Unilateral primary osteoarthritis, left knee: Secondary | ICD-10-CM | POA: Diagnosis not present

## 2023-08-04 DIAGNOSIS — E1136 Type 2 diabetes mellitus with diabetic cataract: Secondary | ICD-10-CM | POA: Diagnosis not present

## 2023-08-04 DIAGNOSIS — E1169 Type 2 diabetes mellitus with other specified complication: Secondary | ICD-10-CM | POA: Diagnosis not present

## 2023-08-04 DIAGNOSIS — E1159 Type 2 diabetes mellitus with other circulatory complications: Secondary | ICD-10-CM | POA: Diagnosis not present

## 2023-08-04 DIAGNOSIS — I10 Essential (primary) hypertension: Secondary | ICD-10-CM | POA: Diagnosis not present

## 2023-08-04 DIAGNOSIS — E113292 Type 2 diabetes mellitus with mild nonproliferative diabetic retinopathy without macular edema, left eye: Secondary | ICD-10-CM | POA: Diagnosis not present

## 2023-08-04 DIAGNOSIS — E1165 Type 2 diabetes mellitus with hyperglycemia: Secondary | ICD-10-CM | POA: Diagnosis not present

## 2023-09-04 DIAGNOSIS — E113292 Type 2 diabetes mellitus with mild nonproliferative diabetic retinopathy without macular edema, left eye: Secondary | ICD-10-CM | POA: Diagnosis not present

## 2023-09-04 DIAGNOSIS — M1712 Unilateral primary osteoarthritis, left knee: Secondary | ICD-10-CM | POA: Diagnosis not present

## 2023-09-04 DIAGNOSIS — E1136 Type 2 diabetes mellitus with diabetic cataract: Secondary | ICD-10-CM | POA: Diagnosis not present

## 2023-09-04 DIAGNOSIS — E1169 Type 2 diabetes mellitus with other specified complication: Secondary | ICD-10-CM | POA: Diagnosis not present

## 2023-09-04 DIAGNOSIS — E1159 Type 2 diabetes mellitus with other circulatory complications: Secondary | ICD-10-CM | POA: Diagnosis not present

## 2023-09-04 DIAGNOSIS — E1165 Type 2 diabetes mellitus with hyperglycemia: Secondary | ICD-10-CM | POA: Diagnosis not present

## 2023-09-04 DIAGNOSIS — I10 Essential (primary) hypertension: Secondary | ICD-10-CM | POA: Diagnosis not present

## 2023-09-16 DIAGNOSIS — E785 Hyperlipidemia, unspecified: Secondary | ICD-10-CM | POA: Diagnosis not present

## 2023-09-16 DIAGNOSIS — G629 Polyneuropathy, unspecified: Secondary | ICD-10-CM | POA: Diagnosis not present

## 2023-09-16 DIAGNOSIS — I10 Essential (primary) hypertension: Secondary | ICD-10-CM | POA: Diagnosis not present

## 2023-09-16 DIAGNOSIS — Z96659 Presence of unspecified artificial knee joint: Secondary | ICD-10-CM | POA: Diagnosis not present

## 2023-09-16 DIAGNOSIS — K219 Gastro-esophageal reflux disease without esophagitis: Secondary | ICD-10-CM | POA: Diagnosis not present

## 2023-09-16 DIAGNOSIS — Z0000 Encounter for general adult medical examination without abnormal findings: Secondary | ICD-10-CM | POA: Diagnosis not present
